# Patient Record
Sex: Male | Born: 1940 | Race: Black or African American | Hispanic: No | Marital: Married | Smoking: Former smoker
Health system: Southern US, Community
[De-identification: ages and names within clinical notes are randomized; demographics above are authoritative.]

## PROBLEM LIST (undated history)

## (undated) DIAGNOSIS — Q6231 Congenital ureterocele, orthotopic: Secondary | ICD-10-CM

## (undated) DIAGNOSIS — E119 Type 2 diabetes mellitus without complications: Secondary | ICD-10-CM

## (undated) DIAGNOSIS — I10 Essential (primary) hypertension: Secondary | ICD-10-CM

## (undated) DIAGNOSIS — C61 Malignant neoplasm of prostate: Secondary | ICD-10-CM

## (undated) HISTORY — PX: EYE SURGERY: SHX253

## (undated) HISTORY — DX: Malignant neoplasm of prostate: C61

## (undated) HISTORY — PX: COLONOSCOPY W/ POLYPECTOMY: SHX1380

---

## 1999-06-12 ENCOUNTER — Emergency Department (HOSPITAL_COMMUNITY): Admission: EM | Admit: 1999-06-12 | Discharge: 1999-06-12 | Payer: Self-pay | Admitting: Emergency Medicine

## 1999-06-13 ENCOUNTER — Encounter: Payer: Self-pay | Admitting: Emergency Medicine

## 1999-09-13 ENCOUNTER — Emergency Department (HOSPITAL_COMMUNITY): Admission: EM | Admit: 1999-09-13 | Discharge: 1999-09-13 | Payer: Self-pay | Admitting: *Deleted

## 2001-02-21 ENCOUNTER — Encounter: Admission: RE | Admit: 2001-02-21 | Discharge: 2001-05-22 | Payer: Self-pay | Admitting: Internal Medicine

## 2006-03-14 ENCOUNTER — Ambulatory Visit (HOSPITAL_COMMUNITY): Admission: RE | Admit: 2006-03-14 | Discharge: 2006-03-15 | Payer: Self-pay | Admitting: Ophthalmology

## 2006-06-04 ENCOUNTER — Emergency Department (HOSPITAL_COMMUNITY): Admission: EM | Admit: 2006-06-04 | Discharge: 2006-06-04 | Payer: Self-pay | Admitting: Emergency Medicine

## 2008-12-11 ENCOUNTER — Encounter: Payer: Self-pay | Admitting: Gastroenterology

## 2009-01-15 DIAGNOSIS — F528 Other sexual dysfunction not due to a substance or known physiological condition: Secondary | ICD-10-CM

## 2009-01-15 DIAGNOSIS — I10 Essential (primary) hypertension: Secondary | ICD-10-CM | POA: Insufficient documentation

## 2009-01-15 DIAGNOSIS — R011 Cardiac murmur, unspecified: Secondary | ICD-10-CM | POA: Insufficient documentation

## 2009-01-15 DIAGNOSIS — E785 Hyperlipidemia, unspecified: Secondary | ICD-10-CM

## 2009-01-20 ENCOUNTER — Ambulatory Visit: Payer: Self-pay | Admitting: Gastroenterology

## 2009-01-20 DIAGNOSIS — E119 Type 2 diabetes mellitus without complications: Secondary | ICD-10-CM | POA: Insufficient documentation

## 2009-02-12 ENCOUNTER — Encounter: Payer: Self-pay | Admitting: Gastroenterology

## 2009-02-18 ENCOUNTER — Ambulatory Visit: Payer: Self-pay | Admitting: Gastroenterology

## 2009-03-03 ENCOUNTER — Telehealth: Payer: Self-pay | Admitting: Gastroenterology

## 2009-03-04 ENCOUNTER — Ambulatory Visit: Payer: Self-pay | Admitting: Gastroenterology

## 2009-03-04 ENCOUNTER — Encounter: Payer: Self-pay | Admitting: Gastroenterology

## 2009-03-09 ENCOUNTER — Encounter: Payer: Self-pay | Admitting: Gastroenterology

## 2009-05-20 DIAGNOSIS — N529 Male erectile dysfunction, unspecified: Secondary | ICD-10-CM | POA: Insufficient documentation

## 2010-08-05 ENCOUNTER — Ambulatory Visit (HOSPITAL_COMMUNITY)
Admission: RE | Admit: 2010-08-05 | Discharge: 2010-08-05 | Payer: Self-pay | Source: Home / Self Care | Attending: Urology | Admitting: Urology

## 2010-08-06 ENCOUNTER — Ambulatory Visit (HOSPITAL_COMMUNITY)
Admission: RE | Admit: 2010-08-06 | Discharge: 2010-08-06 | Payer: Self-pay | Source: Home / Self Care | Attending: Urology | Admitting: Urology

## 2010-08-12 DIAGNOSIS — C61 Malignant neoplasm of prostate: Secondary | ICD-10-CM | POA: Insufficient documentation

## 2010-08-19 ENCOUNTER — Other Ambulatory Visit (HOSPITAL_COMMUNITY): Payer: Self-pay | Admitting: Urology

## 2010-08-19 DIAGNOSIS — Q6231 Congenital ureterocele, orthotopic: Secondary | ICD-10-CM

## 2010-08-23 ENCOUNTER — Ambulatory Visit: Payer: No Typology Code available for payment source | Attending: Radiation Oncology | Admitting: Radiation Oncology

## 2010-08-23 DIAGNOSIS — E119 Type 2 diabetes mellitus without complications: Secondary | ICD-10-CM | POA: Insufficient documentation

## 2010-08-23 DIAGNOSIS — E785 Hyperlipidemia, unspecified: Secondary | ICD-10-CM | POA: Insufficient documentation

## 2010-08-23 DIAGNOSIS — Z87891 Personal history of nicotine dependence: Secondary | ICD-10-CM | POA: Insufficient documentation

## 2010-08-23 DIAGNOSIS — I10 Essential (primary) hypertension: Secondary | ICD-10-CM | POA: Insufficient documentation

## 2010-08-23 DIAGNOSIS — Z510 Encounter for antineoplastic radiation therapy: Secondary | ICD-10-CM | POA: Insufficient documentation

## 2010-08-23 DIAGNOSIS — C61 Malignant neoplasm of prostate: Secondary | ICD-10-CM | POA: Insufficient documentation

## 2010-08-26 ENCOUNTER — Encounter (HOSPITAL_COMMUNITY)
Admission: RE | Admit: 2010-08-26 | Discharge: 2010-08-26 | Disposition: A | Discharge status: Home / Self Care | Payer: No Typology Code available for payment source | Source: Ambulatory Visit | Attending: Urology | Admitting: Urology

## 2010-08-26 ENCOUNTER — Encounter (HOSPITAL_COMMUNITY): Payer: Self-pay

## 2010-08-26 DIAGNOSIS — N2889 Other specified disorders of kidney and ureter: Secondary | ICD-10-CM | POA: Insufficient documentation

## 2010-08-26 DIAGNOSIS — Q6231 Congenital ureterocele, orthotopic: Secondary | ICD-10-CM

## 2010-08-26 HISTORY — DX: Congenital ureterocele, orthotopic: Q62.31

## 2010-08-26 MED ORDER — TECHNETIUM TC 99M MERTIATIDE
14.9000 | Freq: Once | INTRAVENOUS | Status: AC | PRN
  Administered 2010-08-26: 14.9 via INTRAVENOUS

## 2010-08-26 MED ORDER — FUROSEMIDE 10 MG/ML IJ SOLN: 44.0000 mg/kg | Freq: Once | INTRAMUSCULAR | Status: DC

## 2010-10-16 LAB — GLUCOSE, CAPILLARY
Glucose-Capillary: 152 mg/dL — ABNORMAL HIGH (ref 70–99)
Glucose-Capillary: 154 mg/dL — ABNORMAL HIGH (ref 70–99)

## 2010-11-21 ENCOUNTER — Ambulatory Visit
Admission: RE | Admit: 2010-11-21 | Discharge: 2010-11-21 | Disposition: A | Discharge status: Home / Self Care | Payer: No Typology Code available for payment source | Source: Ambulatory Visit | Attending: Radiation Oncology | Admitting: Radiation Oncology

## 2010-11-21 DIAGNOSIS — C61 Malignant neoplasm of prostate: Secondary | ICD-10-CM | POA: Insufficient documentation

## 2010-11-21 DIAGNOSIS — Z510 Encounter for antineoplastic radiation therapy: Secondary | ICD-10-CM | POA: Insufficient documentation

## 2010-11-26 NOTE — Op Note (Signed)
[name], [name]              ACCOUNT NO.:  [id number]   MEDICAL RECORD NO.:  [id number]          PATIENT TYPE:  AMB   LOCATION:  SDS                          FACILITY:  [facility]   PHYSICIAN:  John D. Ashley Royalty, M.D. DATE OF BIRTH:  03-10-1941   DATE OF PROCEDURE:  03/14/2006  DATE OF DISCHARGE:                                 OPERATIVE REPORT   ADMISSION DIAGNOSIS:  Rhegmatogenous retinal detachment, left eye.   PROCEDURES:  Scleral buckle left eye, retinal photocoagulation left eye.   SURGEON:  Alan Mulder, MD   ASSISTANT:  Adela Ports, R.N.   ANESTHESIA:  General.   DETAILS:  Usual prep and drape, 360 degree limbal peritomy, isolation of 4  rectus muscles on 2-0 silk.  Localization of break in the upper temporal  quadrant, scleral dissection from 11 o'clock around to 10 o'clock to admit a  number 279 intrascleral implant.  Diathermy placed in the bed, 279 implant  placed around the globe from 11 o'clock around to 10 o'clock, 240 band  placed around the eye with a 270 sleeve at 10:30 o'clock.  Perforation site  chosen at 5 o'clock in the posterior aspect of the bed.  A large amount of  clear yellow subretinal fluid came forth.  The fluid was thick and sticky.  When the fluid stopped, the scleral flaps were closed with 4-0 interrupted  Mersilene sutures.  The buckle was adjusted and trimmed.  The band was  adjusted and trimmed.  The sutures were knotted and trimmed.  The indirect  ophthalmoscopy showed the retina to be lying nicely on the scleral buckle  with no subretinal fluid remaining.  The indirect ophthalmoscope laser was  moved into place, and 1100 burns were placed around the retinal periphery  and around the retinal break with a power of 500 milliwatts 1.1 seconds each  and 1000 microns each.  The conjunctiva was reapproximated with 7-0 chromic  suture.  Polymyxin and gentamicin were irrigated into [place] space.  Atropine  solution was applied.  Marcaine was injected  around the globe for postop  pain.  TobraDex ophthalmic ointment, a patch and shield were placed.  Closing pressure was 15 with a Baer keratometer.  Complications none.  Duration 2 hours.  The patient is awakened, taken to recovery in  satisfactory condition.      [name], M.D.  Electronically Signed     JDM/MEDQ  D:  03/14/2006  T:  03/14/2006  Job:  [id number]

## 2011-02-10 ENCOUNTER — Ambulatory Visit: Payer: No Typology Code available for payment source | Admitting: Radiation Oncology

## 2011-03-10 ENCOUNTER — Emergency Department (HOSPITAL_COMMUNITY)
Admission: EM | Admit: 2011-03-10 | Discharge: 2011-03-10 | Disposition: A | Discharge status: Home / Self Care | Payer: No Typology Code available for payment source | Attending: Emergency Medicine | Admitting: Emergency Medicine

## 2011-03-10 ENCOUNTER — Ambulatory Visit
Admission: RE | Admit: 2011-03-10 | Discharge: 2011-03-10 | Disposition: A | Discharge status: Home / Self Care | Payer: No Typology Code available for payment source | Source: Ambulatory Visit | Attending: Radiation Oncology | Admitting: Radiation Oncology

## 2011-03-10 DIAGNOSIS — Z794 Long term (current) use of insulin: Secondary | ICD-10-CM | POA: Insufficient documentation

## 2011-03-10 DIAGNOSIS — E1169 Type 2 diabetes mellitus with other specified complication: Secondary | ICD-10-CM | POA: Insufficient documentation

## 2011-03-10 DIAGNOSIS — R55 Syncope and collapse: Secondary | ICD-10-CM | POA: Insufficient documentation

## 2011-03-10 LAB — DIFFERENTIAL
Lymphs Abs: 0.7 10*3/uL (ref 0.7–4.0)
Monocytes Absolute: 0.3 10*3/uL (ref 0.1–1.0)
Monocytes Relative: 3 % (ref 3–12)
Neutro Abs: 8.6 10*3/uL — ABNORMAL HIGH (ref 1.7–7.7)
Neutrophils Relative %: 89 % — ABNORMAL HIGH (ref 43–77)

## 2011-03-10 LAB — CBC
HCT: 37 % — ABNORMAL LOW (ref 39.0–52.0)
Hemoglobin: 12.8 g/dL — ABNORMAL LOW (ref 13.0–17.0)
MCH: 28.8 pg (ref 26.0–34.0)
MCHC: 34.6 g/dL (ref 30.0–36.0)
MCV: 83.3 fL (ref 78.0–100.0)
RBC: 4.44 MIL/uL (ref 4.22–5.81)

## 2011-03-10 LAB — BASIC METABOLIC PANEL
BUN: 22 mg/dL (ref 6–23)
CO2: 29 mEq/L (ref 19–32)
Calcium: 10.8 mg/dL — ABNORMAL HIGH (ref 8.4–10.5)
Creatinine, Ser: 1.34 mg/dL (ref 0.50–1.35)
Glucose, Bld: 332 mg/dL — ABNORMAL HIGH (ref 70–99)

## 2011-03-10 LAB — URINALYSIS, ROUTINE W REFLEX MICROSCOPIC
Bilirubin Urine: NEGATIVE
Glucose, UA: 250 mg/dL — AB
Hgb urine dipstick: NEGATIVE
Protein, ur: NEGATIVE mg/dL

## 2011-03-10 LAB — GLUCOSE, CAPILLARY: Glucose-Capillary: 345 mg/dL — ABNORMAL HIGH (ref 70–99)

## 2012-02-22 ENCOUNTER — Encounter: Payer: Self-pay | Admitting: Gastroenterology

## 2012-10-18 ENCOUNTER — Encounter: Payer: Self-pay | Admitting: Gastroenterology

## 2014-02-06 ENCOUNTER — Emergency Department (HOSPITAL_COMMUNITY): Payer: Medicare Other

## 2014-02-06 ENCOUNTER — Emergency Department (HOSPITAL_COMMUNITY)
Admission: EM | Admit: 2014-02-06 | Discharge: 2014-02-06 | Disposition: A | Discharge status: Home / Self Care | Payer: Medicare Other | Attending: Emergency Medicine | Admitting: Emergency Medicine

## 2014-02-06 ENCOUNTER — Encounter (HOSPITAL_COMMUNITY): Payer: Self-pay | Admitting: Emergency Medicine

## 2014-02-06 DIAGNOSIS — K859 Acute pancreatitis without necrosis or infection, unspecified: Secondary | ICD-10-CM | POA: Insufficient documentation

## 2014-02-06 DIAGNOSIS — E119 Type 2 diabetes mellitus without complications: Secondary | ICD-10-CM | POA: Insufficient documentation

## 2014-02-06 DIAGNOSIS — Z794 Long term (current) use of insulin: Secondary | ICD-10-CM | POA: Insufficient documentation

## 2014-02-06 DIAGNOSIS — Z79899 Other long term (current) drug therapy: Secondary | ICD-10-CM | POA: Insufficient documentation

## 2014-02-06 DIAGNOSIS — R1011 Right upper quadrant pain: Secondary | ICD-10-CM | POA: Diagnosis present

## 2014-02-06 DIAGNOSIS — Q6231 Congenital ureterocele, orthotopic: Secondary | ICD-10-CM | POA: Insufficient documentation

## 2014-02-06 DIAGNOSIS — I10 Essential (primary) hypertension: Secondary | ICD-10-CM | POA: Diagnosis not present

## 2014-02-06 DIAGNOSIS — Z87891 Personal history of nicotine dependence: Secondary | ICD-10-CM | POA: Insufficient documentation

## 2014-02-06 HISTORY — DX: Essential (primary) hypertension: I10

## 2014-02-06 HISTORY — DX: Type 2 diabetes mellitus without complications: E11.9

## 2014-02-06 LAB — COMPREHENSIVE METABOLIC PANEL
ALBUMIN: 4.2 g/dL (ref 3.5–5.2)
ALK PHOS: 100 U/L (ref 39–117)
ALT: 13 U/L (ref 0–53)
AST: 14 U/L (ref 0–37)
Anion gap: 13 (ref 5–15)
BUN: 15 mg/dL (ref 6–23)
CALCIUM: 10.1 mg/dL (ref 8.4–10.5)
CO2: 28 mEq/L (ref 19–32)
Chloride: 97 mEq/L (ref 96–112)
Creatinine, Ser: 1.27 mg/dL (ref 0.50–1.35)
GFR calc non Af Amer: 54 mL/min — ABNORMAL LOW (ref >90)
GFR, EST AFRICAN AMERICAN: 63 mL/min — AB (ref >90)
GLUCOSE: 245 mg/dL — AB (ref 70–99)
POTASSIUM: 4.2 meq/L (ref 3.7–5.3)
SODIUM: 138 meq/L (ref 137–147)
TOTAL PROTEIN: 8.1 g/dL (ref 6.0–8.3)
Total Bilirubin: 0.9 mg/dL (ref 0.3–1.2)

## 2014-02-06 LAB — CBC WITH DIFFERENTIAL/PLATELET
BASOS ABS: 0 10*3/uL (ref 0.0–0.1)
Basophils Relative: 0 % (ref 0–1)
EOS ABS: 0 10*3/uL (ref 0.0–0.7)
EOS PCT: 1 % (ref 0–5)
HCT: 43 % (ref 39.0–52.0)
Hemoglobin: 14.3 g/dL (ref 13.0–17.0)
LYMPHS ABS: 1.5 10*3/uL (ref 0.7–4.0)
Lymphocytes Relative: 19 % (ref 12–46)
MCH: 27.7 pg (ref 26.0–34.0)
MCHC: 33.3 g/dL (ref 30.0–36.0)
MCV: 83.3 fL (ref 78.0–100.0)
Monocytes Absolute: 0.4 10*3/uL (ref 0.1–1.0)
Monocytes Relative: 5 % (ref 3–12)
NEUTROS PCT: 75 % (ref 43–77)
Neutro Abs: 6.1 10*3/uL (ref 1.7–7.7)
PLATELETS: 178 10*3/uL (ref 150–400)
RBC: 5.16 MIL/uL (ref 4.22–5.81)
RDW: 12.6 % (ref 11.5–15.5)
WBC: 8 10*3/uL (ref 4.0–10.5)

## 2014-02-06 LAB — I-STAT CG4 LACTIC ACID, ED: Lactic Acid, Venous: 1.64 mmol/L (ref 0.5–2.2)

## 2014-02-06 LAB — LIPASE, BLOOD: Lipase: 45 U/L (ref 11–59)

## 2014-02-06 LAB — URINALYSIS, ROUTINE W REFLEX MICROSCOPIC
BILIRUBIN URINE: NEGATIVE
Glucose, UA: 250 mg/dL — AB
Hgb urine dipstick: NEGATIVE
Ketones, ur: NEGATIVE mg/dL
NITRITE: NEGATIVE
PH: 7 (ref 5.0–8.0)
Protein, ur: NEGATIVE mg/dL
SPECIFIC GRAVITY, URINE: 1.018 (ref 1.005–1.030)
UROBILINOGEN UA: 1 mg/dL (ref 0.0–1.0)

## 2014-02-06 LAB — I-STAT TROPONIN, ED: TROPONIN I, POC: 0 ng/mL (ref 0.00–0.08)

## 2014-02-06 LAB — URINE MICROSCOPIC-ADD ON

## 2014-02-06 LAB — CBG MONITORING, ED: GLUCOSE-CAPILLARY: 240 mg/dL — AB (ref 70–99)

## 2014-02-06 MED ORDER — HYDROCODONE-ACETAMINOPHEN 5-325 MG PO TABS
2.0000 | ORAL_TABLET | ORAL | Status: DC | PRN
Start: 2014-02-06 — End: 2015-11-21

## 2014-02-06 MED ORDER — ONDANSETRON 4 MG PO TBDP
4.0000 mg | ORAL_TABLET | Freq: Once | ORAL | Status: AC
  Administered 2014-02-06: 4 mg via ORAL
  Filled 2014-02-06: qty 1

## 2014-02-06 MED ORDER — ONDANSETRON 4 MG PO TBDP: 4.0000 mg | ORAL_TABLET | Freq: Three times a day (TID) | ORAL | Status: DC | PRN

## 2014-02-06 MED ORDER — ONDANSETRON HCL 4 MG/2ML IJ SOLN
4.0000 mg | Freq: Once | INTRAMUSCULAR | Status: AC
  Administered 2014-02-06: 4 mg via INTRAVENOUS
  Filled 2014-02-06: qty 2

## 2014-02-06 MED ORDER — IOHEXOL 300 MG/ML  SOLN
100.0000 mL | Freq: Once | INTRAMUSCULAR | Status: AC | PRN
  Administered 2014-02-06: 100 mL via INTRAVENOUS

## 2014-02-06 MED ORDER — IOHEXOL 300 MG/ML  SOLN
25.0000 mL | Freq: Once | INTRAMUSCULAR | Status: AC | PRN
  Administered 2014-02-06: 25 mL via ORAL

## 2014-02-06 MED ORDER — BENAZEPRIL HCL 40 MG PO TABS
40.0000 mg | ORAL_TABLET | Freq: Once | ORAL | Status: AC
  Administered 2014-02-06: 40 mg via ORAL
  Filled 2014-02-06: qty 1

## 2014-02-06 MED ORDER — SODIUM CHLORIDE 0.9 % IV BOLUS (SEPSIS)
1000.0000 mL | Freq: Once | INTRAVENOUS | Status: AC
  Administered 2014-02-06: 1000 mL via INTRAVENOUS

## 2014-02-06 NOTE — ED Notes (Signed)
Pt provided w/ diet ginger ale.

## 2014-02-06 NOTE — ED Notes (Signed)
Patient transported to CT 

## 2014-02-06 NOTE — ED Notes (Signed)
Pt w/ onset of nausea and diarrhea on Tuesday, denies fever or vomiting - admitted to some abd pain yesterday however denies any at present. Pt associates symptoms to "possible food poisoning."

## 2014-02-06 NOTE — ED Notes (Signed)
Checked patient blood sugar it was 240 notified RN sabrina of blood sugar

## 2014-02-06 NOTE — ED Notes (Signed)
Family member reports patient has had stomach cramps and diarrhea since Tuesday.  Pt reports upper abdominal pain.  No sob or chest pain

## 2014-02-06 NOTE — Discharge Instructions (Signed)

## 2014-02-06 NOTE — ED Notes (Signed)
Pt ambulating independently w/ steady gait on d/c in no acute distress, A&Ox4. D/c instructions reviewed w/ pt and family - pt and family deny any further questions or concerns at present. Rx given x2  

## 2014-02-06 NOTE — ED Provider Notes (Signed)
CSN: [id number]     Arrival date & time 02/06/14  1609 History   First MD Initiated Contact with Patient 02/06/14 1933     Chief Complaint  Patient presents with  . Abdominal Pain  . Diarrhea     (Consider location/radiation/quality/duration/timing/severity/associated sxs/prior Treatment) Patient is a 73 y.o. male presenting with cramps. The history is provided by the patient.  Abdominal Cramping Pain location:  RUQ Pain quality: cramping   Pain radiates to:  Does not radiate Pain severity:  Moderate Onset quality:  Sudden Duration:  2 days Timing:  Intermittent Progression:  Waxing and waning Chronicity:  New Context: suspicious food intake   Relieved by:  Nothing Worsened by:  Nothing tried Ineffective treatments:  None tried Associated symptoms: diarrhea and vomiting   Associated symptoms: no chest pain, no chills, no fever and no shortness of breath   Risk factors: being elderly     Patient is a 73 year old male with a chief complaint of right upper quadrant abdominal pain nausea and diarrhea. Patient states is going on since Sunday. Personally 4 days ago. Patient states that the night before that he went to a retired [employer]. Otherwise the patient denies any strange food intake. He denies any past abdominal surgeries. Patient denies fevers chills. Patient did not examine his stool for blood or dark stools.   Past Medical History  Diagnosis Date  . Congenital ureterocele   . Diabetes mellitus without complication   . Hypertension    Past Surgical History  Procedure Laterality Date  . Eye surgery     No family history on file. History  Substance Use Topics  . Smoking status: Former Research scientist (life sciences)  . Smokeless tobacco: Not on file  . Alcohol Use: No    Review of Systems  Constitutional: Negative for fever and chills.  HENT: Negative for congestion and facial swelling.   Eyes: Negative for discharge and visual disturbance.  Respiratory: Negative for  shortness of breath.   Cardiovascular: Negative for chest pain and palpitations.  Gastrointestinal: Positive for vomiting and diarrhea. Negative for abdominal pain.  Musculoskeletal: Negative for arthralgias and myalgias.  Skin: Negative for color change and rash.  Neurological: Negative for tremors, syncope and headaches.  Psychiatric/Behavioral: Negative for confusion and dysphoric mood.      Allergies  Review of patient's allergies indicates no known allergies.  Home Medications   Prior to Admission medications   Medication Sig Start Date End Date Taking? Authorizing Provider  benazepril (LOTENSIN) 40 MG tablet Take 40 mg by mouth daily.   Yes Historical Provider, MD  carvedilol (COREG) 6.25 MG tablet Take 6.25 mg by mouth 2 (two) times daily with a meal.   Yes Historical Provider, MD  Insulin Glargine (LANTUS SOLOSTAR) 100 UNIT/ML Solostar Pen Inject 50 Units into the skin daily.   Yes Historical Provider, MD  insulin lispro (HUMALOG KWIKPEN) 100 UNIT/ML KiwkPen Inject 12 Units into the skin 3 (three) times daily.   Yes Historical Provider, MD  metFORMIN (GLUMETZA) 1000 MG (MOD) 24 hr tablet Take 1,000 mg by mouth daily with breakfast.   Yes Historical Provider, MD  rosuvastatin (CRESTOR) 20 MG tablet Take 20 mg by mouth at bedtime.   Yes Historical Provider, MD  triamterene-hydrochlorothiazide (MAXZIDE-25) 37.5-25 MG per tablet Take 1 tablet by mouth daily.   Yes Historical Provider, MD  HYDROcodone-acetaminophen (NORCO/VICODIN) 5-325 MG per tablet Take 2 tablets by mouth every 4 (four) hours as needed for moderate pain or severe pain. 02/06/14   Linna Hoff  Tyrone Nine, MD  ondansetron (ZOFRAN ODT) 4 MG disintegrating tablet Take 1 tablet (4 mg total) by mouth every 8 (eight) hours as needed for nausea or vomiting. 02/06/14   Deno Etienne, MD   BP 180/99  Pulse 78  Temp(Src) 99.1 F (37.3 C) (Oral)  Resp 20  Wt 200 lb (90.719 kg)  SpO2 98% Physical Exam  Constitutional: He is oriented to  person, place, and time. He appears well-developed and well-nourished.  HENT:  Head: Normocephalic and atraumatic.  Eyes: EOM are normal. Pupils are equal, round, and reactive to light.  Neck: Normal range of motion. Neck supple. No JVD present.  Cardiovascular: Normal rate and regular rhythm.  Exam reveals no gallop and no friction rub.   No murmur heard. Pulmonary/Chest: No respiratory distress. He has no wheezes.  Abdominal: He exhibits no distension. There is no tenderness. There is no rebound and no guarding.  Musculoskeletal: Normal range of motion.  Neurological: He is alert and oriented to person, place, and time.  Skin: No rash noted. No pallor.  Psychiatric: He has a normal mood and affect. His behavior is normal.    ED Course  Procedures (including critical care time) Labs Review Labs Reviewed  COMPREHENSIVE METABOLIC PANEL - Abnormal; Notable for the following:    Glucose, Bld 245 (*)    GFR calc non Af Amer 54 (*)    GFR calc Af Amer 63 (*)    All other components within normal limits  URINALYSIS, ROUTINE W REFLEX MICROSCOPIC - Abnormal; Notable for the following:    Glucose, UA 250 (*)    Leukocytes, UA SMALL (*)    All other components within normal limits  URINE MICROSCOPIC-ADD ON - Abnormal; Notable for the following:    Squamous Epithelial / LPF FEW (*)    Bacteria, UA FEW (*)    All other components within normal limits  CBG MONITORING, ED - Abnormal; Notable for the following:    Glucose-Capillary 240 (*)    All other components within normal limits  CBC WITH DIFFERENTIAL  LIPASE, BLOOD  I-STAT TROPOININ, ED  I-STAT CG4 LACTIC ACID, ED    Imaging Review Ct Abdomen Pelvis W Contrast  02/06/2014   CLINICAL DATA:  Abdominal pain and distention.  EXAM: CT ABDOMEN AND PELVIS WITH CONTRAST  TECHNIQUE: Multidetector CT imaging of the abdomen and pelvis was performed using the standard protocol following bolus administration of intravenous contrast.  CONTRAST:   135mL OMNIPAQUE IOHEXOL 300 MG/ML  SOLN  COMPARISON:  CT scan of the pelvis dated 08/13/2010  FINDINGS: There is haziness around the head of the pancreas consistent with slight pancreatitis. Pancreatic duct measures 2.5 mm in diameter. Liver, biliary tree, spleen, and adrenal glands are normal. There are multiple benign-appearing left renal cysts. There is a large right peripelvic cyst which compresses the right renal pelvis. Both ureters are slightly dilated to the level of the bladder where there are prominent bilateral ureteroceles, 4.7 cm on the left and 1.7 cm on the right.  There are multiple diverticula in the distal colon but there is no diverticulitis. The bowel is otherwise normal including the terminal ileum and appendix. No acute osseous abnormalities.  IMPRESSION: 1. Findings consistent with mild pancreatitis. 2. Large bilateral ureteroceles.   Electronically Signed   By: Rozetta Nunnery M.D.   On: 02/06/2014 23:11     EKG Interpretation   Date/Time:  Thursday February 06 2014 16:37:42 EDT Ventricular Rate:  67 PR Interval:  164 QRS Duration: 104  QT Interval:  380 QTC Calculation: 401 R Axis:   -26 Text Interpretation:  Normal sinus rhythm Normal ECG No significant change  since last tracing Confirmed by YAO  MD, DAVID ([id number]) on 02/06/2014  9:07:57 PM      MDM   Final diagnoses:  Acute pancreatitis, unspecified pancreatitis type    73 year old male with benign laboratory workup for right upper quadrant abdominal pain. Patient's pain currently is 0 patient with some residual nausea will attempt to give Zofran oral trial.  Due to age and continued nausea, will CT abdomen/pelvis w/ contrast.   Patient returned from CT with worsening nausea, give IV zofran.   Mild pancreatitis noted on CT scan. Benefits and risks of admission discussed with the patient. Patient feels that he is safe for discharge home. Patient will be given pain and nausea medications. Followup with PCP in 2  days.  11:49 PM:  I have discussed the diagnosis/risks/treatment options with the patient and family and believe the pt to be eligible for discharge home to follow-up with PCP. We also discussed returning to the ED immediately if new or worsening sx occur. We discussed the sx which are most concerning (e.g., sudden worsening abdominal pain) that necessitate immediate return. Medications administered to the patient during their visit and any new prescriptions provided to the patient are listed below.  Medications given during this visit Medications  ondansetron (ZOFRAN-ODT) disintegrating tablet 4 mg (4 mg Oral Given 02/06/14 2030)  benazepril (LOTENSIN) tablet 40 mg (40 mg Oral Given 02/06/14 2152)  sodium chloride 0.9 % bolus 1,000 mL (0 mLs Intravenous Stopped 02/06/14 2309)  iohexol (OMNIPAQUE) 300 MG/ML solution 25 mL (25 mLs Oral Contrast Given 02/06/14 2211)  iohexol (OMNIPAQUE) 300 MG/ML solution 100 mL (100 mLs Intravenous Contrast Given 02/06/14 2226)  ondansetron (ZOFRAN) injection 4 mg (4 mg Intravenous Given 02/06/14 2254)    New Prescriptions   HYDROCODONE-ACETAMINOPHEN (NORCO/VICODIN) 5-325 MG PER TABLET    Take 2 tablets by mouth every 4 (four) hours as needed for moderate pain or severe pain.   ONDANSETRON (ZOFRAN ODT) 4 MG DISINTEGRATING TABLET    Take 1 tablet (4 mg total) by mouth every 8 (eight) hours as needed for nausea or vomiting.     Deno Etienne, MD 02/06/14 [phone]

## 2014-02-07 NOTE — ED Provider Notes (Signed)
I saw and evaluated the patient, reviewed the resident's note and I agree with the findings and plan.   EKG Interpretation   Date/Time:  Thursday February 06 2014 16:37:42 EDT Ventricular Rate:  67 PR Interval:  164 QRS Duration: 104 QT Interval:  380 QTC Calculation: 401 R Axis:   -26 Text Interpretation:  Normal sinus rhythm Normal ECG No significant change  since last tracing Confirmed by YAO  MD, DAVID ([id number]) on 02/06/2014  9:07:57 PM      Xavier [name] is a 73 y.o. male hx of DM, HTN here with abdominal pain, nausea, diarrhea started several days ago after eating fireman's breakfast. Intermittent nausea since then. Vitals stable. Abdomen slightly distended, mild tenderness RUQ and RLQ. CT showed mild pancreatitis but lipase nl. Able to tolerate PO. Will d/c home.     Wandra Arthurs, MD 02/07/14 [phone]

## 2014-02-11 ENCOUNTER — Other Ambulatory Visit: Payer: Self-pay | Admitting: Internal Medicine

## 2014-02-11 DIAGNOSIS — K859 Acute pancreatitis, unspecified: Secondary | ICD-10-CM

## 2014-02-14 ENCOUNTER — Ambulatory Visit
Admission: RE | Admit: 2014-02-14 | Discharge: 2014-02-14 | Disposition: A | Discharge status: Home / Self Care | Payer: Medicare Other | Source: Ambulatory Visit | Attending: Internal Medicine | Admitting: Internal Medicine

## 2014-02-14 ENCOUNTER — Other Ambulatory Visit: Payer: Self-pay | Admitting: Internal Medicine

## 2014-02-14 DIAGNOSIS — K859 Acute pancreatitis, unspecified: Secondary | ICD-10-CM

## 2014-04-01 DIAGNOSIS — M25519 Pain in unspecified shoulder: Secondary | ICD-10-CM | POA: Insufficient documentation

## 2014-04-02 ENCOUNTER — Encounter: Payer: Self-pay | Admitting: Gastroenterology

## 2014-05-27 ENCOUNTER — Other Ambulatory Visit: Payer: Self-pay | Admitting: Orthopedic Surgery

## 2014-05-27 DIAGNOSIS — M25511 Pain in right shoulder: Secondary | ICD-10-CM

## 2014-05-30 ENCOUNTER — Ambulatory Visit
Admission: RE | Admit: 2014-05-30 | Discharge: 2014-05-30 | Disposition: A | Discharge status: Home / Self Care | Payer: Medicare Other | Source: Ambulatory Visit | Attending: Orthopedic Surgery | Admitting: Orthopedic Surgery

## 2014-05-30 DIAGNOSIS — M25511 Pain in right shoulder: Secondary | ICD-10-CM

## 2014-06-02 ENCOUNTER — Other Ambulatory Visit: Payer: Medicare Other

## 2014-06-10 DIAGNOSIS — M79609 Pain in unspecified limb: Secondary | ICD-10-CM | POA: Insufficient documentation

## 2014-07-30 DIAGNOSIS — Z1389 Encounter for screening for other disorder: Secondary | ICD-10-CM | POA: Insufficient documentation

## 2014-09-28 ENCOUNTER — Emergency Department (HOSPITAL_COMMUNITY)
Admission: EM | Admit: 2014-09-28 | Discharge: 2014-09-28 | Disposition: A | Discharge status: Home / Self Care | Payer: Medicare Other | Attending: Emergency Medicine | Admitting: Emergency Medicine

## 2014-09-28 ENCOUNTER — Encounter (HOSPITAL_COMMUNITY): Payer: Self-pay | Admitting: *Deleted

## 2014-09-28 DIAGNOSIS — E11649 Type 2 diabetes mellitus with hypoglycemia without coma: Secondary | ICD-10-CM | POA: Insufficient documentation

## 2014-09-28 DIAGNOSIS — N289 Disorder of kidney and ureter, unspecified: Secondary | ICD-10-CM | POA: Insufficient documentation

## 2014-09-28 DIAGNOSIS — E162 Hypoglycemia, unspecified: Secondary | ICD-10-CM

## 2014-09-28 DIAGNOSIS — Z794 Long term (current) use of insulin: Secondary | ICD-10-CM | POA: Diagnosis not present

## 2014-09-28 DIAGNOSIS — I10 Essential (primary) hypertension: Secondary | ICD-10-CM | POA: Diagnosis not present

## 2014-09-28 DIAGNOSIS — Z79899 Other long term (current) drug therapy: Secondary | ICD-10-CM | POA: Insufficient documentation

## 2014-09-28 DIAGNOSIS — Z87891 Personal history of nicotine dependence: Secondary | ICD-10-CM | POA: Diagnosis not present

## 2014-09-28 LAB — CBC WITH DIFFERENTIAL/PLATELET
BASOS PCT: 0 % (ref 0–1)
Basophils Absolute: 0 10*3/uL (ref 0.0–0.1)
EOS ABS: 0 10*3/uL (ref 0.0–0.7)
Eosinophils Relative: 0 % (ref 0–5)
HEMATOCRIT: 43 % (ref 39.0–52.0)
Hemoglobin: 14.1 g/dL (ref 13.0–17.0)
LYMPHS PCT: 15 % (ref 12–46)
Lymphs Abs: 1.3 10*3/uL (ref 0.7–4.0)
MCH: 26.7 pg (ref 26.0–34.0)
MCHC: 32.8 g/dL (ref 30.0–36.0)
MCV: 81.3 fL (ref 78.0–100.0)
MONOS PCT: 4 % (ref 3–12)
Monocytes Absolute: 0.4 10*3/uL (ref 0.1–1.0)
Neutro Abs: 7.2 10*3/uL (ref 1.7–7.7)
Neutrophils Relative %: 81 % — ABNORMAL HIGH (ref 43–77)
Platelets: 268 10*3/uL (ref 150–400)
RBC: 5.29 MIL/uL (ref 4.22–5.81)
RDW: 12.7 % (ref 11.5–15.5)
WBC: 8.9 10*3/uL (ref 4.0–10.5)

## 2014-09-28 LAB — COMPREHENSIVE METABOLIC PANEL
ALBUMIN: 4 g/dL (ref 3.5–5.2)
ALK PHOS: 103 U/L (ref 39–117)
ALT: 21 U/L (ref 0–53)
AST: 25 U/L (ref 0–37)
Anion gap: 8 (ref 5–15)
BUN: 22 mg/dL (ref 6–23)
CO2: 29 mmol/L (ref 19–32)
Calcium: 9.9 mg/dL (ref 8.4–10.5)
Chloride: 101 mmol/L (ref 96–112)
Creatinine, Ser: 1.79 mg/dL — ABNORMAL HIGH (ref 0.50–1.35)
GFR calc Af Amer: 41 mL/min — ABNORMAL LOW (ref >90)
GFR, EST NON AFRICAN AMERICAN: 36 mL/min — AB (ref >90)
Glucose, Bld: 156 mg/dL — ABNORMAL HIGH (ref 70–99)
Potassium: 3.9 mmol/L (ref 3.5–5.1)
Sodium: 138 mmol/L (ref 135–145)
TOTAL PROTEIN: 7.5 g/dL (ref 6.0–8.3)
Total Bilirubin: 0.8 mg/dL (ref 0.3–1.2)

## 2014-09-28 LAB — URINALYSIS, ROUTINE W REFLEX MICROSCOPIC
BILIRUBIN URINE: NEGATIVE
GLUCOSE, UA: NEGATIVE mg/dL
HGB URINE DIPSTICK: NEGATIVE
Ketones, ur: NEGATIVE mg/dL
Nitrite: NEGATIVE
Protein, ur: 30 mg/dL — AB
SPECIFIC GRAVITY, URINE: 1.013 (ref 1.005–1.030)
Urobilinogen, UA: 1 mg/dL (ref 0.0–1.0)
pH: 6.5 (ref 5.0–8.0)

## 2014-09-28 LAB — URINE MICROSCOPIC-ADD ON

## 2014-09-28 LAB — I-STAT TROPONIN, ED: Troponin i, poc: 0.01 ng/mL (ref 0.00–0.08)

## 2014-09-28 LAB — CBG MONITORING, ED: Glucose-Capillary: 230 mg/dL — ABNORMAL HIGH (ref 70–99)

## 2014-09-28 MED ORDER — SODIUM CHLORIDE 0.9 % IV BOLUS (SEPSIS)
1000.0000 mL | Freq: Once | INTRAVENOUS | Status: AC
  Administered 2014-09-28: 1000 mL via INTRAVENOUS

## 2014-09-28 NOTE — ED Notes (Signed)
CBG 230 

## 2014-09-28 NOTE — ED Provider Notes (Signed)
CSN: [id number]     Arrival date & time 09/28/14  1613 History   First MD Initiated Contact with Patient 09/28/14 1615     Chief Complaint  Patient presents with  . Hypoglycemia  . Weakness     (Consider location/radiation/quality/duration/timing/severity/associated sxs/prior Treatment) The history is provided by the patient.  Xavier [name] is a 74 y.o. male hx of DM, HTN here with diaphoresis, low blood sugar. He missed lunch and went to a funeral to take pictures. He then felt like his blood sugar was low and became clammy and diaphoretic. Took 2 glucose tablets and cbg was 198 as per EMS. Denies chest pain or shortness of breath. Denies abdominal pain or vomiting. Took lantus and humalog this morning.    Past Medical History  Diagnosis Date  . Congenital ureterocele   . Diabetes mellitus without complication   . Hypertension    Past Surgical History  Procedure Laterality Date  . Eye surgery     History reviewed. No pertinent family history. History  Substance Use Topics  . Smoking status: Former Research scientist (life sciences)  . Smokeless tobacco: Not on file  . Alcohol Use: No    Review of Systems  Neurological: Positive for weakness.  All other systems reviewed and are negative.     Allergies  Review of patient's allergies indicates no known allergies.  Home Medications   Prior to Admission medications   Medication Sig Start Date End Date Taking? Authorizing Provider  benazepril (LOTENSIN) 40 MG tablet Take 40 mg by mouth daily.   Yes Historical Provider, MD  carvedilol (COREG) 6.25 MG tablet Take 6.25 mg by mouth 2 (two) times daily with a meal.   Yes Historical Provider, MD  Insulin Glargine (LANTUS SOLOSTAR) 100 UNIT/ML Solostar Pen Inject 50 Units into the skin daily.   Yes Historical Provider, MD  insulin lispro (HUMALOG KWIKPEN) 100 UNIT/ML KiwkPen Inject 12 Units into the skin 3 (three) times daily.   Yes Historical Provider, MD  metFORMIN (GLUMETZA) 1000 MG (MOD) 24 hr  tablet Take 1,000 mg by mouth daily with breakfast.   Yes Historical Provider, MD  rosuvastatin (CRESTOR) 20 MG tablet Take 20 mg by mouth at bedtime.   Yes Historical Provider, MD  triamterene-hydrochlorothiazide (MAXZIDE-25) 37.5-25 MG per tablet Take 1 tablet by mouth daily.   Yes Historical Provider, MD  HYDROcodone-acetaminophen (NORCO/VICODIN) 5-325 MG per tablet Take 2 tablets by mouth every 4 (four) hours as needed for moderate pain or severe pain. Patient not taking: Reported on 09/28/2014 02/06/14   Deno Etienne, DO  ondansetron (ZOFRAN ODT) 4 MG disintegrating tablet Take 1 tablet (4 mg total) by mouth every 8 (eight) hours as needed for nausea or vomiting. Patient not taking: Reported on 09/28/2014 02/06/14   Deno Etienne, DO   BP 157/68 mmHg  Pulse 77  Temp(Src) 97.9 F (36.6 C) (Oral)  Resp 21  SpO2 95% Physical Exam  Constitutional: He is oriented to person, place, and time.  Chronically ill, NAD   HENT:  Head: Normocephalic.  MM slightly dry   Eyes: Conjunctivae are normal. Pupils are equal, round, and reactive to light.  Neck: Normal range of motion. Neck supple.  Cardiovascular: Normal rate, regular rhythm and normal heart sounds.   Pulmonary/Chest: Effort normal and breath sounds normal. No respiratory distress. He has no wheezes. He has no rales.  Abdominal: Soft. Bowel sounds are normal. He exhibits no distension. There is no tenderness. There is no rebound and no guarding.  Musculoskeletal: Normal  range of motion. He exhibits no edema or tenderness.  Neurological: He is alert and oriented to person, place, and time. No cranial nerve deficit. Coordination normal.  Skin: Skin is warm and dry.  Psychiatric: He has a normal mood and affect. His behavior is normal. Thought content normal.  Nursing note and vitals reviewed.   ED Course  Procedures (including critical care time) Labs Review Labs Reviewed  CBC WITH DIFFERENTIAL/PLATELET - Abnormal; Notable for the following:     Neutrophils Relative % 81 (*)    All other components within normal limits  COMPREHENSIVE METABOLIC PANEL - Abnormal; Notable for the following:    Glucose, Bld 156 (*)    Creatinine, Ser 1.79 (*)    GFR calc non Af Amer 36 (*)    GFR calc Af Amer 41 (*)    All other components within normal limits  URINALYSIS, ROUTINE W REFLEX MICROSCOPIC - Abnormal; Notable for the following:    Protein, ur 30 (*)    Leukocytes, UA TRACE (*)    All other components within normal limits  CBG MONITORING, ED - Abnormal; Notable for the following:    Glucose-Capillary 230 (*)    All other components within normal limits  URINE MICROSCOPIC-ADD ON  I-STAT TROPOININ, ED    Imaging Review No results found.   EKG Interpretation None      MDM   Final diagnoses:  None    Xavier [name] is a 74 y.o. male here with diaphoresis likely from hypoglycemia. Will check labs and UA. Will hydrate and give food and recheck CBG. Doesn't appear septic.   7:40 PM Patient observed for the ED for 3 hrs. CBG was 230. Ate food. Patient's Cr more elevated likely from dehydration. Given NS 1L. Recommend frequent meals, request BMP   Wandra Arthurs, MD 09/28/14 1940

## 2014-09-28 NOTE — Discharge Instructions (Signed)
Eat frequent meals.   You are dehydrated from not drinking enough. Make sure you drink plenty of fluids.   Follow up with your doctor. Repeat kidney function test in a week.   Return to ER if you have blood sugar < 60 or greater than 500, vomiting, dehydration.

## 2014-09-28 NOTE — ED Notes (Signed)
Pt arrived by [facility]. Pt was at a funeral, felt like his blood sugar dropped and skin became clammy, pt took 2 glucose tablets. On ems arrival, cbg 198 and skin remained clammy. Iv started and bp 130/62.

## 2014-10-07 ENCOUNTER — Other Ambulatory Visit: Payer: Self-pay | Admitting: Endocrinology

## 2014-10-07 ENCOUNTER — Ambulatory Visit
Admission: RE | Admit: 2014-10-07 | Discharge: 2014-10-07 | Disposition: A | Discharge status: Home / Self Care | Payer: Medicare Other | Source: Ambulatory Visit | Attending: Endocrinology | Admitting: Endocrinology

## 2014-10-07 DIAGNOSIS — K858 Other acute pancreatitis without necrosis or infection: Secondary | ICD-10-CM

## 2014-10-08 DIAGNOSIS — N159 Renal tubulo-interstitial disease, unspecified: Secondary | ICD-10-CM | POA: Insufficient documentation

## 2014-10-13 DIAGNOSIS — I700 Atherosclerosis of aorta: Secondary | ICD-10-CM | POA: Insufficient documentation

## 2015-06-11 DIAGNOSIS — R252 Cramp and spasm: Secondary | ICD-10-CM | POA: Insufficient documentation

## 2015-11-21 ENCOUNTER — Emergency Department (HOSPITAL_COMMUNITY)
Admission: EM | Admit: 2015-11-21 | Discharge: 2015-11-21 | Disposition: A | Discharge status: Home / Self Care | Payer: Medicare Other | Attending: Emergency Medicine | Admitting: Emergency Medicine

## 2015-11-21 ENCOUNTER — Encounter (HOSPITAL_COMMUNITY): Payer: Self-pay

## 2015-11-21 DIAGNOSIS — R61 Generalized hyperhidrosis: Secondary | ICD-10-CM | POA: Diagnosis not present

## 2015-11-21 DIAGNOSIS — Z79899 Other long term (current) drug therapy: Secondary | ICD-10-CM | POA: Diagnosis not present

## 2015-11-21 DIAGNOSIS — Z794 Long term (current) use of insulin: Secondary | ICD-10-CM | POA: Insufficient documentation

## 2015-11-21 DIAGNOSIS — E119 Type 2 diabetes mellitus without complications: Secondary | ICD-10-CM | POA: Insufficient documentation

## 2015-11-21 DIAGNOSIS — R252 Cramp and spasm: Secondary | ICD-10-CM | POA: Diagnosis not present

## 2015-11-21 DIAGNOSIS — Z7984 Long term (current) use of oral hypoglycemic drugs: Secondary | ICD-10-CM | POA: Insufficient documentation

## 2015-11-21 DIAGNOSIS — Z87891 Personal history of nicotine dependence: Secondary | ICD-10-CM | POA: Insufficient documentation

## 2015-11-21 DIAGNOSIS — Q6231 Congenital ureterocele, orthotopic: Secondary | ICD-10-CM | POA: Insufficient documentation

## 2015-11-21 DIAGNOSIS — E860 Dehydration: Secondary | ICD-10-CM | POA: Diagnosis not present

## 2015-11-21 DIAGNOSIS — I10 Essential (primary) hypertension: Secondary | ICD-10-CM | POA: Insufficient documentation

## 2015-11-21 LAB — BASIC METABOLIC PANEL
Anion gap: 11 (ref 5–15)
BUN: 29 mg/dL — ABNORMAL HIGH (ref 6–20)
CALCIUM: 10.2 mg/dL (ref 8.9–10.3)
CO2: 25 mmol/L (ref 22–32)
CREATININE: 1.67 mg/dL — AB (ref 0.61–1.24)
Chloride: 101 mmol/L (ref 101–111)
GFR, EST AFRICAN AMERICAN: 45 mL/min — AB (ref >60)
GFR, EST NON AFRICAN AMERICAN: 38 mL/min — AB (ref >60)
Glucose, Bld: 131 mg/dL — ABNORMAL HIGH (ref 65–99)
Potassium: 4.3 mmol/L (ref 3.5–5.1)
SODIUM: 137 mmol/L (ref 135–145)

## 2015-11-21 LAB — CBC WITH DIFFERENTIAL/PLATELET
BASOS PCT: 0 %
Basophils Absolute: 0 10*3/uL (ref 0.0–0.1)
EOS ABS: 0.1 10*3/uL (ref 0.0–0.7)
EOS PCT: 1 %
HCT: 42.7 % (ref 39.0–52.0)
HEMOGLOBIN: 13.9 g/dL (ref 13.0–17.0)
LYMPHS PCT: 14 %
Lymphs Abs: 1.2 10*3/uL (ref 0.7–4.0)
MCH: 26.8 pg (ref 26.0–34.0)
MCHC: 32.6 g/dL (ref 30.0–36.0)
MCV: 82.4 fL (ref 78.0–100.0)
MONOS PCT: 4 %
Monocytes Absolute: 0.4 10*3/uL (ref 0.1–1.0)
NEUTROS ABS: 7 10*3/uL (ref 1.7–7.7)
NEUTROS PCT: 81 %
PLATELETS: 204 10*3/uL (ref 150–400)
RBC: 5.18 MIL/uL (ref 4.22–5.81)
RDW: 12.8 % (ref 11.5–15.5)
WBC: 8.7 10*3/uL (ref 4.0–10.5)

## 2015-11-21 LAB — CK: CK TOTAL: 1511 U/L — AB (ref 49–397)

## 2015-11-21 LAB — MAGNESIUM: Magnesium: 2.7 mg/dL — ABNORMAL HIGH (ref 1.7–2.4)

## 2015-11-21 LAB — TROPONIN I

## 2015-11-21 MED ORDER — SODIUM CHLORIDE 0.9 % IV BOLUS (SEPSIS)
500.0000 mL | Freq: Once | INTRAVENOUS | Status: AC
  Administered 2015-11-21: 500 mL via INTRAVENOUS

## 2015-11-21 NOTE — Discharge Instructions (Signed)
Stay well hydrated. Hold cholesterol statin medicine until you see your doctor.  If you were given medicines take as directed.  If you are on coumadin or contraceptives realize their levels and effectiveness is altered by many different medicines.  If you have any reaction (rash, tongues swelling, other) to the medicines stop taking and see a physician.    If your blood pressure was elevated in the ER make sure you follow up for management with a primary doctor or return for chest pain, shortness of breath or stroke symptoms.  Please follow up as directed and return to the ER or see a physician for new or worsening symptoms.  Thank you. Filed Vitals:   11/21/15 1400  BP: 145/66  Pulse: 77  Temp: 98.1 F (36.7 C)  TempSrc: Oral  SpO2: 95%

## 2015-11-21 NOTE — ED Provider Notes (Signed)
CSN: [id number]     Arrival date & time 11/21/15  1350 History   First MD Initiated Contact with Patient 11/21/15 1503     Chief Complaint  Patient presents with  . Leg Pain     (Consider location/radiation/quality/duration/timing/severity/associated sxs/prior Treatment) HPI Comments: 75 year-old male with history of diabetes, lipids, high blood pressure presents with cramping. Patient's had intermittent cramping in the hands and arms for over a year however he developed cramping in the bilateral lower extremities worse recently. Today he was outside in the heat watching a track and field me in the cramping got constant and more severe than he ever had. No swelling in the legs is new. Past smoker. Patient is on Lasix. Patient says he's been drinking fluids regularly today. No fevers or chills. Patient did feel hot and mild sweating but he says is normal for him. No chest pain or shortness of breath. Patient feels well currently with no significant cramping. Sugar was 200s.  Patient is a 75 y.o. male presenting with leg pain. The history is provided by the patient.  Leg Pain Associated symptoms: no back pain, no fever and no neck pain     Past Medical History  Diagnosis Date  . Congenital ureterocele   . Diabetes mellitus without complication ([place])   . Hypertension    Past Surgical History  Procedure Laterality Date  . Eye surgery     No family history on file. Social History  Substance Use Topics  . Smoking status: Former Research scientist (life sciences)  . Smokeless tobacco: None  . Alcohol Use: No    Review of Systems  Constitutional: Positive for diaphoresis. Negative for fever and chills.  HENT: Negative for congestion.   Eyes: Negative for visual disturbance.  Respiratory: Negative for shortness of breath.   Cardiovascular: Negative for chest pain.  Gastrointestinal: Negative for vomiting and abdominal pain.  Genitourinary: Negative for dysuria and flank pain.  Musculoskeletal: Positive for  arthralgias. Negative for back pain, neck pain and neck stiffness.  Skin: Negative for rash.  Neurological: Negative for light-headedness and headaches.      Allergies  Review of patient's allergies indicates no known allergies.  Home Medications   Prior to Admission medications   Medication Sig Start Date End Date Taking? Authorizing Provider  benazepril (LOTENSIN) 40 MG tablet Take 40 mg by mouth daily.    Historical Provider, MD  carvedilol (COREG) 6.25 MG tablet Take 6.25 mg by mouth 2 (two) times daily with a meal.    Historical Provider, MD  HYDROcodone-acetaminophen (NORCO/VICODIN) 5-325 MG per tablet Take 2 tablets by mouth every 4 (four) hours as needed for moderate pain or severe pain. Patient not taking: Reported on 09/28/2014 02/06/14   Deno Etienne, DO  Insulin Glargine (LANTUS SOLOSTAR) 100 UNIT/ML Solostar Pen Inject 50 Units into the skin daily.    Historical Provider, MD  insulin lispro (HUMALOG KWIKPEN) 100 UNIT/ML KiwkPen Inject 12 Units into the skin 3 (three) times daily.    Historical Provider, MD  metFORMIN (GLUMETZA) 1000 MG (MOD) 24 hr tablet Take 1,000 mg by mouth daily with breakfast.    Historical Provider, MD  ondansetron (ZOFRAN ODT) 4 MG disintegrating tablet Take 1 tablet (4 mg total) by mouth every 8 (eight) hours as needed for nausea or vomiting. Patient not taking: Reported on 09/28/2014 02/06/14   Deno Etienne, DO  rosuvastatin (CRESTOR) 20 MG tablet Take 20 mg by mouth at bedtime.    Historical Provider, MD  triamterene-hydrochlorothiazide (MAXZIDE-25) 37.5-25 MG  per tablet Take 1 tablet by mouth daily.    Historical Provider, MD   BP 181/81 mmHg  Pulse 78  Temp(Src) 98.1 F (36.7 C) (Oral)  Resp 17  SpO2 98% Physical Exam  Constitutional: He is oriented to person, place, and time. He appears well-developed and well-nourished.  HENT:  Head: Normocephalic and atraumatic.  Dry mucous membranes  Eyes: Conjunctivae are normal. Right eye exhibits no  discharge. Left eye exhibits no discharge.  Neck: Normal range of motion. Neck supple. No tracheal deviation present.  Cardiovascular: Normal rate and regular rhythm.   Pulmonary/Chest: Effort normal and breath sounds normal.  Abdominal: Soft. He exhibits no distension. There is no tenderness. There is no guarding.  Musculoskeletal: He exhibits no edema.  Neurological: He is alert and oriented to person, place, and time.  Skin: Skin is warm. No rash noted.  Psychiatric: He has a normal mood and affect.  Nursing note and vitals reviewed.   ED Course  Procedures (including critical care time) Labs Review Labs Reviewed  BASIC METABOLIC PANEL - Abnormal; Notable for the following:    Glucose, Bld 131 (*)    BUN 29 (*)    Creatinine, Ser 1.67 (*)    GFR calc non Af Amer 38 (*)    GFR calc Af Amer 45 (*)    All other components within normal limits  CK - Abnormal; Notable for the following:    Total CK 1511 (*)    All other components within normal limits  MAGNESIUM - Abnormal; Notable for the following:    Magnesium 2.7 (*)    All other components within normal limits  CBC WITH DIFFERENTIAL/PLATELET  TROPONIN I    Imaging Review No results found. I have personally reviewed and evaluated these images and lab results as part of my medical decision-making.   EKG Interpretation   Date/Time:  Saturday Nov 21 2015 15:51:34 EDT Ventricular Rate:  72 PR Interval:  186 QRS Duration: 92 QT Interval:  399 QTC Calculation: 437 R Axis:   -36 Text Interpretation:  Sinus rhythm Left axis deviation Probable  anteroseptal infarct, old similar to previous Confirmed by Kalley Nicholl MD,  Edit Ricciardelli [phone]) on 11/21/2015 4:21:10 PM      MDM   Final diagnoses:  Cramp of both lower extremities  Dehydration   Patient presents with cramping and mild diaphoresis. Plan for screening load work for electrolytes, troponin EKG. Patient feels well currently with no symptoms. Patient requesting to follow  up outpatient. IV and oral fluids ordered.  Pt no sxs in ED.  Discussed holding cholesterol med and staying hydrated.  Results and differential diagnosis were discussed with the patient/parent/guardian. Xrays were independently reviewed by myself.  Close follow up outpatient was discussed, comfortable with the plan.   Medications  sodium chloride 0.9 % bolus 500 mL (0 mLs Intravenous Stopped 11/21/15 1702)    Filed Vitals:   11/21/15 1430 11/21/15 1600 11/21/15 1630 11/21/15 1700  BP: 141/78 159/71 163/75 181/81  Pulse: 72 70 74 78  Temp:      TempSrc:      Resp: 19 19 18 17   SpO2: 96% 96% 95% 98%    Final diagnoses:  Cramp of both lower extremities  Dehydration       Elnora Morrison, MD 11/21/15 1730

## 2015-11-21 NOTE — ED Notes (Signed)
Pt. BIB [facility] for evaluation of bilateral lower leg cramping. Pt. Has had intermittent cramping x few weeks, seen by PCP and has been eating mustard with no improvement. Pt. Was taking photos at outdoor track meet this AM, reports very diaphoretic, normal urine output. Pt. States he initially felt weak and lethargic, took oral glucose and orange juice. CBG 283.

## 2016-03-02 DIAGNOSIS — Z87898 Personal history of other specified conditions: Secondary | ICD-10-CM | POA: Insufficient documentation

## 2016-06-21 DIAGNOSIS — B353 Tinea pedis: Secondary | ICD-10-CM | POA: Insufficient documentation

## 2016-07-12 DIAGNOSIS — L84 Corns and callosities: Secondary | ICD-10-CM | POA: Insufficient documentation

## 2016-07-13 ENCOUNTER — Encounter: Payer: Self-pay | Admitting: Podiatry

## 2016-07-13 ENCOUNTER — Ambulatory Visit (INDEPENDENT_AMBULATORY_CARE_PROVIDER_SITE_OTHER): Payer: Medicare Other | Admitting: Podiatry

## 2016-07-13 VITALS — BP 176/82 | HR 67 | Temp 98.2°F | Resp 18

## 2016-07-13 DIAGNOSIS — L309 Dermatitis, unspecified: Secondary | ICD-10-CM | POA: Diagnosis not present

## 2016-07-13 DIAGNOSIS — R0989 Other specified symptoms and signs involving the circulatory and respiratory systems: Secondary | ICD-10-CM

## 2016-07-13 NOTE — Patient Instructions (Signed)
Approximately 1 month history of discolored skin lesions on the bottom of your right and left feet that you're currently treating with Lamisil cream without any improvement. Am unsure of the specific diagnosis and I am recommending that you contact a dermatologist for a definitive diagnosis. [facility] could be one option Also I had difficulty feeling 1 pulses on the top of your left foot and I'm referring you to vascular lab to evaluate his circulation in your legs and feet and we contact you with the results of the exam    Diabetes and Foot Care Diabetes may cause you to have problems because of poor blood supply (circulation) to your feet and legs. This may cause the skin on your feet to become thinner, break easier, and heal more slowly. Your skin may become dry, and the skin may peel and crack. You may also have nerve damage in your legs and feet causing decreased feeling in them. You may not notice minor injuries to your feet that could lead to infections or more serious problems. Taking care of your feet is one of the most important things you can do for yourself. Follow these instructions at home:  Wear shoes at all times, even in the house. Do not go barefoot. Bare feet are easily injured.  Check your feet daily for blisters, cuts, and redness. If you cannot see the bottom of your feet, use a mirror or ask someone for help.  Wash your feet with warm water (do not use hot water) and mild soap. Then pat your feet and the areas between your toes until they are completely dry. Do not soak your feet as this can dry your skin.  Apply a moisturizing lotion or petroleum jelly (that does not contain alcohol and is unscented) to the skin on your feet and to dry, brittle toenails. Do not apply lotion between your toes.  Trim your toenails straight across. Do not dig under them or around the cuticle. File the edges of your nails with an emery board or nail file.  Do not cut corns or  calluses or try to remove them with medicine.  Wear clean socks or stockings every day. Make sure they are not too tight. Do not wear knee-high stockings since they may decrease blood flow to your legs.  Wear shoes that fit properly and have enough cushioning. To break in new shoes, wear them for just a few hours a day. This prevents you from injuring your feet. Always look in your shoes before you put them on to be sure there are no objects inside.  Do not cross your legs. This may decrease the blood flow to your feet.  If you find a minor scrape, cut, or break in the skin on your feet, keep it and the skin around it clean and dry. These areas may be cleansed with mild soap and water. Do not cleanse the area with peroxide, alcohol, or iodine.  When you remove an adhesive bandage, be sure not to damage the skin around it.  If you have a wound, look at it several times a day to make sure it is healing.  Do not use heating pads or hot water bottles. They may burn your skin. If you have lost feeling in your feet or legs, you may not know it is happening until it is too late.  Make sure your health care provider performs a complete foot exam at least annually or more often if you have foot  problems. Report any cuts, sores, or bruises to your health care provider immediately. Contact a health care provider if:  You have an injury that is not healing.  You have cuts or breaks in the skin.  You have an ingrown nail.  You notice redness on your legs or feet.  You feel burning or tingling in your legs or feet.  You have pain or cramps in your legs and feet.  Your legs or feet are numb.  Your feet always feel cold. Get help right away if:  There is increasing redness, swelling, or pain in or around a wound.  There is a red line that goes up your leg.  Pus is coming from a wound.  You develop a fever or as directed by your health care provider.  You notice a bad smell coming from an  ulcer or wound. This information is not intended to replace advice given to you by your health care provider. Make sure you discuss any questions you have with your health care provider. Document Released: 06/24/2000 Document Revised: 12/03/2015 Document Reviewed: 12/04/2012 Elsevier Interactive Patient Education  2017 [employer].

## 2016-07-13 NOTE — Progress Notes (Signed)
   Subjective:    Patient ID: Xavier [name], male    DOB: [birth year], 76 y.o.   MRN: [id number]  HPI       patient is complaining of mild soreness and tenderness and bilateral skin lesions when standing and with direct pressure present for approximately 3-4 weeks. He describes applying Lamisil cream daily to the skin lesions 3 weeks without any change in the appearance or sensation in these areas. He denies any other areas of similar involvement    patient is diabetic and denies any history of foot ulceration , claudication or amputation   patient denies history of smoking     Review of Systems  All other systems reviewed and are negative.      Objective:   Physical Exam   orientated 3     vascular  DP pulse right 2/4 and DP pulse left 0/4  PT pulses 2/4 right and 2/4 left   capillary reflex delayed bilaterally    neurological:  sensation to 10 g monofilament wire intact 4/5 bilaterally  ankle reflexes reactive bilaterally  vibratory sensation intact bilaterally  Dermatological:  no open skin lesions bilaterally   plantar right arch has diffuse Brown hyperpigmented lesions 3 cm x 1 cm.   small callus fifth MPJ right and left  plantar left  Also has some Brown hyperpigmentation noted diffuse lesions   both these areas hyperpigmentation demonstrates no surrounding erythema, edema or drainage   mild plantar callus fifth MPJ left   Musculoskeletal:  dorsi flexion, plantar flexion 5/5 bilaterally   :       Assessment & Plan:    assessment:  diabetic with absent pedal pulse   plantar skin lesions undetermined origin   Plan:  today I reviewed the results of exam with patient today and I am referring patient to the vascular lab for lower extremity arterial Doppler with TBI is and ABIs for the indication of diabetic with absent pedal pulse   also I informed patient I was unsure of the diagnosis of the plantar skin lesions  Bilaterally and I am referring patient  to  [facility] dermatology to have these plantar skin lesions evaluated    notify patient of results of that vascular lab

## 2016-07-14 ENCOUNTER — Other Ambulatory Visit: Payer: Self-pay | Admitting: Podiatry

## 2016-07-14 DIAGNOSIS — R0989 Other specified symptoms and signs involving the circulatory and respiratory systems: Secondary | ICD-10-CM

## 2016-07-15 ENCOUNTER — Ambulatory Visit (HOSPITAL_COMMUNITY)
Admission: RE | Admit: 2016-07-15 | Discharge: 2016-07-15 | Disposition: A | Discharge status: Home / Self Care | Payer: Medicare Other | Source: Ambulatory Visit | Attending: Podiatry | Admitting: Podiatry

## 2016-07-15 DIAGNOSIS — E785 Hyperlipidemia, unspecified: Secondary | ICD-10-CM | POA: Diagnosis not present

## 2016-07-15 DIAGNOSIS — Z87891 Personal history of nicotine dependence: Secondary | ICD-10-CM | POA: Diagnosis not present

## 2016-07-15 DIAGNOSIS — R0989 Other specified symptoms and signs involving the circulatory and respiratory systems: Secondary | ICD-10-CM | POA: Insufficient documentation

## 2016-07-15 DIAGNOSIS — I700 Atherosclerosis of aorta: Secondary | ICD-10-CM | POA: Diagnosis not present

## 2016-07-15 DIAGNOSIS — E119 Type 2 diabetes mellitus without complications: Secondary | ICD-10-CM | POA: Diagnosis not present

## 2016-07-15 DIAGNOSIS — R938 Abnormal findings on diagnostic imaging of other specified body structures: Secondary | ICD-10-CM | POA: Insufficient documentation

## 2016-07-15 DIAGNOSIS — I10 Essential (primary) hypertension: Secondary | ICD-10-CM | POA: Insufficient documentation

## 2016-07-15 DIAGNOSIS — I771 Stricture of artery: Secondary | ICD-10-CM | POA: Diagnosis not present

## 2016-07-15 DIAGNOSIS — I708 Atherosclerosis of other arteries: Secondary | ICD-10-CM | POA: Diagnosis not present

## 2016-07-19 ENCOUNTER — Ambulatory Visit: Payer: Medicare Other | Admitting: Cardiovascular Disease

## 2016-07-19 NOTE — Progress Notes (Deleted)
Cardiology Office Note   Date:  07/19/2016   ID:  Xavier [name], DOB [birth year], MRN [id number]  PCP:  Bridgett Larsson, MD  Cardiologist:   Kathlyn Sacramento, MD  (new)  No chief complaint on file.     History of Present Illness: Xavier [name] is a 76 y.o. male who was referred by Dr. Amalia Hailey for evaluation and management of peripheral arterial disease. The patient has known history of type 2 diabetes. He is a lifelong nonsmoker. Noninvasive vascular evaluation showed an ABI of 0.67 on the right and 0.91 on the left. Duplex showed mild aortoiliac disease, moderate right SFA disease with two-vessel runoff below the knee. There was mild disease affecting the left lower extremity.  Past Medical History:  Diagnosis Date  . Congenital ureterocele   . Diabetes mellitus without complication ([place])   . Hypertension     Past Surgical History:  Procedure Laterality Date  . EYE SURGERY       Current Outpatient Prescriptions  Medication Sig Dispense Refill  . benazepril (LOTENSIN) 40 MG tablet Take 40 mg by mouth daily.    . carvedilol (COREG) 6.25 MG tablet Take 6.25 mg by mouth 2 (two) times daily with a meal.    . cholecalciferol (VITAMIN D) 1000 units tablet Take 1,000 Units by mouth daily.    Marland Kitchen HUMULIN R U-500 KWIKPEN 500 UNIT/ML kwikpen Inject 45 Units into the skin 3 (three) times daily with meals.   11  . IRON PO Take 1 tablet by mouth daily.    Marland Kitchen POTASSIUM PO Take 1 tablet by mouth daily.    . rosuvastatin (CRESTOR) 20 MG tablet Take 20 mg by mouth at bedtime.    . triamterene-hydrochlorothiazide (MAXZIDE-25) 37.5-25 MG per tablet Take 1 tablet by mouth daily.    Marland Kitchen ZETIA 10 MG tablet Take 10 mg by mouth at bedtime.  0   No current facility-administered medications for this visit.     Allergies:   Patient has no known allergies.    Social History:  The patient  reports that he has quit smoking. He does not have any smokeless tobacco history on file. He reports that  he does not drink alcohol or use drugs.   Family History:  The patient's ***family history is not on file.    ROS:  Please see the history of present illness.   Otherwise, review of systems are positive for {NONE DEFAULTED:18576::"none"}.   All other systems are reviewed and negative.    PHYSICAL EXAM: VS:  There were no vitals taken for this visit. , BMI There is no height or weight on file to calculate BMI. GEN: Well nourished, well developed, in no acute distress  HEENT: normal  Neck: no JVD, carotid bruits, or masses Cardiac: ***RRR; no murmurs, rubs, or gallops,no edema  Respiratory:  clear to auscultation bilaterally, normal work of breathing GI: soft, nontender, nondistended, + BS MS: no deformity or atrophy  Skin: warm and dry, no rash Neuro:  Strength and sensation are intact Psych: euthymic mood, full affect   EKG:  EKG {ACTION; IS/IS [id number] ordered today. The ekg ordered today demonstrates ***   Recent Labs: 11/21/2015: BUN 29; Creatinine, Ser 1.67; Hemoglobin 13.9; Magnesium 2.7; Platelets 204; Potassium 4.3; Sodium 137    Lipid Panel No results found for: CHOL, TRIG, HDL, CHOLHDL, VLDL, LDLCALC, LDLDIRECT    Wt Readings from Last 3 Encounters:  02/06/14 200 lb (90.7 kg)  01/20/09 207 lb (93.9 kg)  Other studies Reviewed: Additional studies/ records that were reviewed today include: ***. Review of the above records demonstrates: ***  No flowsheet data found.    ASSESSMENT AND PLAN:  1.  ***    Disposition:   FU with *** in {gen number [id number] {Days to years:10300}  Signed,  Kathlyn Sacramento, MD  07/19/2016 10:03 AM    [place]

## 2016-07-21 ENCOUNTER — Telehealth: Payer: Self-pay | Admitting: *Deleted

## 2016-07-21 NOTE — Telephone Encounter (Deleted)
-----   Message from Gean Birchwood, DPM sent at 07/19/2016  7:49 AM EST -----  Lower extremity arterial Doppler dated 07/16/2016 reviewed and abnormal Schedule follow-up for this arterial Doppler with Dr. Audelia Acton on 01/09/thousand 18 Contact patient and confirm patient's follow-up with Dr.Arrida  Waveforms suggest run-off disease, bilaterally. Right ABI is in the moderate range of flow reduction. Left ABI is in the mild range.  Abnormal great toe-brachial indices, bilaterally. PPG's of all ten toes are abnormal, with damped waveforms noted in the left second and fourth toes. See Arterial Duplex study. Scheduled to see Dr. Fletcher Anon 07-19-16.

## 2016-07-21 NOTE — Telephone Encounter (Addendum)
-----   Message from Gean Birchwood, DPM sent at 07/19/2016  7:49 AM EST -----  Lower extremity arterial Doppler dated 07/16/2016 reviewed and abnormal Schedule follow-up for this arterial Doppler with Dr. Audelia Acton on 01/09/thousand 18 Contact patient and confirm patient's follow-up with Dr.Arrida  Waveforms suggest run-off disease, bilaterally. Right ABI is in the moderate range of flow reduction. Left ABI is in the mild range.  Abnormal great toe-brachial indices, bilaterally. PPG's of all ten toes are abnormal, with damped waveforms noted in the left second and fourth toes. See Arterial Duplex study. Scheduled to see Dr. Fletcher Anon 07-19-16. 07/21/2016-I informed pt of Dr. Phoebe Perch review of the dopplers and reminded pt of his 08/09/2016 9:15am appt with Dr. Fletcher Anon.

## 2016-08-09 ENCOUNTER — Ambulatory Visit (INDEPENDENT_AMBULATORY_CARE_PROVIDER_SITE_OTHER): Payer: Medicare Other | Admitting: Cardiovascular Disease

## 2016-08-09 ENCOUNTER — Encounter: Payer: Self-pay | Admitting: Cardiovascular Disease

## 2016-08-09 VITALS — BP 166/78 | HR 66 | Ht 73.0 in | Wt 203.0 lb

## 2016-08-09 DIAGNOSIS — E785 Hyperlipidemia, unspecified: Secondary | ICD-10-CM | POA: Diagnosis not present

## 2016-08-09 DIAGNOSIS — I739 Peripheral vascular disease, unspecified: Secondary | ICD-10-CM | POA: Diagnosis not present

## 2016-08-09 DIAGNOSIS — I10 Essential (primary) hypertension: Secondary | ICD-10-CM

## 2016-08-09 MED ORDER — ASPIRIN EC 81 MG PO TBEC: 81.0000 mg | DELAYED_RELEASE_TABLET | Freq: Every day | ORAL | Status: DC

## 2016-08-09 NOTE — Progress Notes (Signed)
Cardiology Office Note   Date:  08/09/2016   ID:  Xavier [name], DOB [birth year], MRN [id number]  PCP:  Bridgett Larsson, MD  Cardiologist:  New  Chief Complaint  Patient presents with  . New Patient (Initial Visit)  . Leg Pain    cramping in legs occassionally.      History of Present Illness: Xavier [name] is a 76 y.o. male who was referred by Dr. Amalia Hailey for evaluation and management of peripheral arterial disease. The patient has known history of diabetes, hyperlipidemia and hypertension. He quit smoking in the 80s. He was seen recently by Dr. Amalia Hailey for evaluation of skin lesions on the bottom of both feet. He was noted to have diminished distal pulses and thus he was referred for lower extremity arterial Doppler. He was subsequently seen by dermatology and was diagnosed with eczema. He describes minimal discomfort in his legs with very prolonged walking but he is not limited by this. The discomfort is equal in both legs. He has cramps in both legs below the knee on the front mostly at rest. He has no lower extremity ulceration or wounds. His blood pressure is elevated today but he did not take his blood pressure medications. He has no family history of premature coronary artery disease although he reports that his father died at the age of 37 during cardiac catheterization.    Past Medical History:  Diagnosis Date  . Congenital ureterocele   . Diabetes mellitus without complication ([place])   . Hypertension   . Prostate cancer [facility])     Past Surgical History:  Procedure Laterality Date  . EYE SURGERY       Current Outpatient Prescriptions  Medication Sig Dispense Refill  . atorvastatin (LIPITOR) 40 MG tablet Take 40 mg by mouth daily.    . benazepril (LOTENSIN) 40 MG tablet Take 40 mg by mouth daily.    . carvedilol (COREG) 6.25 MG tablet Take 6.25 mg by mouth 2 (two) times daily with a meal.    . HUMULIN R U-500 KWIKPEN 500 UNIT/ML kwikpen Inject 45 Units  into the skin 3 (three) times daily with meals.   11  . triamterene-hydrochlorothiazide (MAXZIDE-25) 37.5-25 MG per tablet Take 1 tablet by mouth daily.     No current facility-administered medications for this visit.     Allergies:   Patient has no known allergies.    Social History:  The patient  reports that he has quit smoking. He has never used smokeless tobacco. He reports that he does not drink alcohol or use drugs.   Family History:  The patient's Family history: No premature coronary artery disease. Father died at the age of 73 during cardiac catheterization. Mother died of cerebral hemorrhage.  ROS:  Please see the history of present illness.   Otherwise, review of systems are positive for none.   All other systems are reviewed and negative.    PHYSICAL EXAM: VS:  BP (!) 166/78   Pulse 66   Ht 6\' 1"  (1.854 m)   Wt 203 lb (92.1 kg)   BMI 26.78 kg/m  , BMI Body mass index is 26.78 kg/m. GEN: Well nourished, well developed, in no acute distress  HEENT: normal  Neck: no JVD, carotid bruits, or masses Cardiac: RRR; no murmurs, rubs, or gallops,no edema  Respiratory:  clear to auscultation bilaterally, normal work of breathing GI: soft, nontender, nondistended, + BS MS: no deformity or atrophy  Skin: warm and dry, no rash Neuro:  Strength and sensation are intact Psych: euthymic mood, full affect Vascular: Femoral pulses normal bilaterally. Posterior tibial: +1 on the right and +2 on the left. Dorsalis pedis is not palpable.  EKG:  EKG is ordered today. The ekg ordered today demonstrates  Normal sinus rhythm with no significant ST or T wave changes.   Recent Labs: 11/21/2015: BUN 29; Creatinine, Ser 1.67; Hemoglobin 13.9; Magnesium 2.7; Platelets 204; Potassium 4.3; Sodium 137    Lipid Panel No results found for: CHOL, TRIG, HDL, CHOLHDL, VLDL, LDLCALC, LDLDIRECT    Wt Readings from Last 3 Encounters:  08/09/16 203 lb (92.1 kg)  02/06/14 200 lb (90.7 kg)    01/20/09 207 lb (93.9 kg)        No flowsheet data found.    ASSESSMENT AND PLAN:  1.  Peripheral arterial disease: Noninvasive vascular evaluation showed an ABI of 0.67 on the right and 0.91 on the left. Duplex showed moderate right SFA disease .The patient has minimal claudication which does not seem to be lifestyle limiting. There is no evidence of critical limb ischemia. Thus, I recommend medical therapy. I discussed with him the natural history of peripheral arterial disease. I added aspirin 81 mg once daily and recommend treatment of risk factors. We also discussed the importance of foot hygiene.  2. Essential hypertension: Blood pressure is elevated but he does not take his blood pressure medications on a regular basis. I discussed the importance of compliance with medications.  3. Hyperlipidemia: Both atorvastatin and rosuvastatin are among his medications but he reports that he only takes atorvastatin. I recommend a target LDL of less than 70.    Disposition:   FU with me in 6 months  Signed,  Kathlyn Sacramento, MD  08/09/2016 9:55 AM    [address]

## 2016-08-09 NOTE — Patient Instructions (Signed)
Medication Instructions:  Your physician has recommended you make the following change in your medication:  1. START Aspirin 81mg  take one tablet by mouth daily  Labwork: No new orders.   Testing/Procedures: No new orders.   Follow-Up: Your physician wants you to follow-up in: 6 MONTHS with Dr Fletcher Anon.  You will receive a reminder letter in the mail two months in advance. If you don't receive a letter, please call our office to schedule the follow-up appointment.   Any Other Special Instructions Will Be Listed Below (If Applicable).     If you need a refill on your cardiac medications before your next appointment, please call your pharmacy.

## 2016-08-11 ENCOUNTER — Telehealth: Payer: Self-pay | Admitting: *Deleted

## 2016-08-11 NOTE — Telephone Encounter (Deleted)
-----   Message from Gean Birchwood, DPM sent at 08/09/2016  7:50 AM EST -----  Impressions Graft Native LEFT CIA EIA CFA DFA SFA Origin SFA Mid SFA Distal Pop PT DP Per Anastomosis Graft Level 1 Graft Level 2 Graft Level 3 Distal 121 109 135 141 185 186 53 292 51 45  occl CIA EIA CFA DFA SFA Origin SFA Mid SFA Distal Pop PT DP Per 142 140 187 249 144 68 134 43 91 29  Graft Level 1 Graft Level 2 Graft Level 3 Aorta Native Graft CM/sec CM/sec CM/sec CM/sec 74 64 Proximal Anastomosis RIGHT Anastomosis Proximal Distal Anastomosis ABI .67 .91 Brachial Brachial Ankle Ankle DP PT DP PT ABI Examination Data Technically challenging study.  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  Heterogeneous plaque in both lower extremities.  Two areas of stenosis in the right SFA, one is 30-49% at the ostium and the other is 50-74% in the mid to distal segment.  No evidence of stenosis in the left lower extremity.  Two vessel run-off in the right lower extremity, with occluded right anterior tibial artery.  Three vessel run-off in the right lower extremity.  See Arterial Doppler report.  Observations: Risk Factors: Patient is a 76 year old male that presents with absent pedal pulses. Patient denies any lower extremity pain.  Today's ABI's were 0.67 on the right and 0.91 on the left. Diabetes:Yes, Tobacco:Hx, Hyperlipidemia:Yes, History of Tobacco Use:Yes, Hypertension:Yes Tobacco:Hx, Diabetes:Yes, Hypertension:Yes, Absent Pulse:Yes, Hyperlipidemia:Yes History Notes:  Technically challenging study.  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  Heterogeneous plaque in both lower extremities.  Two areas of stenosis in the right SFA, one is 30-49% at the ostium and the other is 50-74% in the mid to distal segment. No evidence of stenosis in the left lower extremity. Two vessel run-off in the right lower extremity,  with occluded right anterior tibial artery. Three vessel run-off in the right lower extremity Scheduled to see Dr. Fletcher Anon 07-19-16. Follow-up by Dr. Fletcher Anon

## 2016-08-11 NOTE — Telephone Encounter (Addendum)
Dr. Amalia Hailey reviewed pt's arterial dopplers and recommend pt follow up with Dr. Fletcher Anon. Left message requesting pt call for instructions and results. Pt called for results. Left message for pt to call and have me pulled from a room or off the phone to give instructions. I spoke with pt and he said Dr. Fletcher Anon gave him his results and told him to follow up again in 6 months.

## 2016-08-25 DIAGNOSIS — I739 Peripheral vascular disease, unspecified: Secondary | ICD-10-CM | POA: Insufficient documentation

## 2016-09-14 ENCOUNTER — Telehealth: Payer: Self-pay | Admitting: *Deleted

## 2016-09-14 NOTE — Telephone Encounter (Addendum)
-----   Message from Gean Birchwood, DPM sent at 09/14/2016  7:49 AM EST ----- Follow-up on this arterial Doppler scheduled with Dr.Arida on 07/19/2016 Confirm patient had follow-up  Two areas of stenosis in the right SFA, one is 30-49% at the ostium and the other is 50-74% in the mid to distal segment. No evidence of stenosis in the left lower extremity. Two vessel run-off in the right lower extremity, with occluded right anterior tibial artery. Three vessel run-off in the right lower extremity Scheduled to see Dr. Fletcher Anon 07-19-16. Follow-up by Dr. Fletcher Anon. Left message requesting status of appt with Dr. Fletcher Anon. 09/19/2016-Pt states 91% in Left leg and 67% in right leg from Dr. Fletcher Anon visit.

## 2016-11-22 DIAGNOSIS — D126 Benign neoplasm of colon, unspecified: Secondary | ICD-10-CM | POA: Insufficient documentation

## 2016-12-22 ENCOUNTER — Encounter: Payer: Self-pay | Admitting: Gastroenterology

## 2017-01-30 ENCOUNTER — Telehealth: Payer: Self-pay | Admitting: *Deleted

## 2017-01-30 NOTE — Telephone Encounter (Signed)
Yes, please - 2 day prep

## 2017-01-30 NOTE — Telephone Encounter (Signed)
Noted. Thanks.

## 2017-01-30 NOTE — Telephone Encounter (Signed)
Patient is for recall colon on 02/22/17. His last colonoscopy was on 03/04/09 with Dr.Patterson. He had a poor prep using the Moviprep. Would you like patient to have the 2 day prep Suprep/Miralax? Please advise. Thank you, Lugene Hitt

## 2017-02-08 ENCOUNTER — Ambulatory Visit (AMBULATORY_SURGERY_CENTER): Payer: Self-pay

## 2017-02-08 ENCOUNTER — Encounter: Payer: Self-pay | Admitting: Gastroenterology

## 2017-02-08 VITALS — Ht 73.5 in | Wt 183.0 lb

## 2017-02-08 DIAGNOSIS — Z860100 Personal history of colon polyps, unspecified: Secondary | ICD-10-CM

## 2017-02-08 MED ORDER — SUPREP BOWEL PREP KIT 17.5-3.13-1.6 GM/177ML PO SOLN: 1.0000 | Freq: Once | ORAL | 0 refills | Status: AC

## 2017-02-08 MED ORDER — SUPREP BOWEL PREP KIT 17.5-3.13-1.6 GM/177ML PO SOLN: 1.0000 | Freq: Once | ORAL | 0 refills | Status: DC

## 2017-02-08 NOTE — Progress Notes (Signed)
No allergies to eggs or soy No diet meds No home oxygen No past problems with anesthesia  Declined emmi 

## 2017-02-14 ENCOUNTER — Ambulatory Visit (INDEPENDENT_AMBULATORY_CARE_PROVIDER_SITE_OTHER): Payer: Medicare Other | Admitting: Cardiovascular Disease

## 2017-02-14 ENCOUNTER — Encounter: Payer: Self-pay | Admitting: Cardiovascular Disease

## 2017-02-14 VITALS — BP 134/67 | HR 70 | Ht 73.5 in | Wt 180.6 lb

## 2017-02-14 DIAGNOSIS — E785 Hyperlipidemia, unspecified: Secondary | ICD-10-CM | POA: Diagnosis not present

## 2017-02-14 DIAGNOSIS — I10 Essential (primary) hypertension: Secondary | ICD-10-CM

## 2017-02-14 DIAGNOSIS — I739 Peripheral vascular disease, unspecified: Secondary | ICD-10-CM | POA: Diagnosis not present

## 2017-02-14 NOTE — Patient Instructions (Signed)
NO CHANGES TO CURRENT TREATMENT OR MEDICATIONS     Your physician wants you to follow-up in [place]. You will receive a reminder letter in the mail two months in advance. If you don't receive a letter, please call our office to schedule the follow-up appointment.    If you need a refill on your cardiac medications before your next appointment, please call your pharmacy.

## 2017-02-14 NOTE — Progress Notes (Signed)
Cardiology Office Note   Date:  02/14/2017   ID:  Xavier [name], DOB 12-04-1940, MRN [id number]  PCP:  Crist Infante, MD  Cardiologist:  Dr. Fletcher Anon  Chief Complaint  Patient presents with  . Follow-up  . Leg Pain    cramping in legs occasionally.      History of Present Illness: Xavier [name] is a 76 y.o. male who Is here today for a follow-up visit regarding peripheral arterial disease. The patient has known history of diabetes, hyperlipidemia and hypertension. He quit smoking in the 80s.  The patient was diagnosed with peripheral arterial disease earlier this year with an abnormal ABI. Noninvasive vascular evaluation showed an ABI of 0.67 on the right and 0.91 on the left. Duplex showed moderate right SFA disease  The patient has minimal right calf claudication and has been treated medically. He reports no new symptoms. He is able to walk for a long distance without having any discomfort. He notices mild right calf discomfort with incline walking. He denies any chest pain or shortness of breath. No history of coronary artery disease. EKG last visit was normal.    Past Medical History:  Diagnosis Date  . Congenital ureterocele   . Diabetes mellitus without complication ([place])   . Hypertension   . Prostate cancer [facility])     Past Surgical History:  Procedure Laterality Date  . COLONOSCOPY W/ POLYPECTOMY    . EYE SURGERY       Current Outpatient Prescriptions  Medication Sig Dispense Refill  . aspirin EC 81 MG tablet Take 1 tablet (81 mg total) by mouth daily.    Marland Kitchen atorvastatin (LIPITOR) 80 MG tablet Take 80 mg by mouth daily.     . benazepril (LOTENSIN) 40 MG tablet Take 40 mg by mouth daily.    . carvedilol (COREG) 12.5 MG tablet Take 12.5 mg by mouth 2 (two) times daily with a meal.     . empagliflozin (JARDIANCE) 25 MG TABS tablet Take 25 mg by mouth every morning.    Marland Kitchen HUMULIN R U-500 KWIKPEN 500 UNIT/ML kwikpen Inject 10 Units into the skin daily with  breakfast.   11  . insulin regular human CONCENTRATED (HUMULIN R) 500 UNIT/ML injection Inject 30 Units into the skin daily with supper.    . triamterene-hydrochlorothiazide (MAXZIDE-25) 37.5-25 MG per tablet Take 1 tablet by mouth daily.     No current facility-administered medications for this visit.     Allergies:   Patient has no known allergies.    Social History:  The patient  reports that he has quit smoking. He has never used smokeless tobacco. He reports that he does not drink alcohol or use drugs.   Family History:  The patient's Family history: No premature coronary artery disease. Father died at the age of 23 during cardiac catheterization. Mother died of cerebral hemorrhage.  ROS:  Please see the history of present illness.   Otherwise, review of systems are positive for none.   All other systems are reviewed and negative.    PHYSICAL EXAM: VS:  BP 134/67   Pulse 70   Ht 6' 1.5" (1.867 m)   Wt 180 lb 9.6 oz (81.9 kg)   BMI 23.50 kg/m  , BMI Body mass index is 23.5 kg/m. GEN: Well nourished, well developed, in no acute distress  HEENT: normal  Neck: no JVD, carotid bruits, or masses Cardiac: RRR; no murmurs, rubs, or gallops,no edema  Respiratory:  clear to auscultation bilaterally,  normal work of breathing GI: soft, nontender, nondistended, + BS MS: no deformity or atrophy  Skin: warm and dry, no rash Neuro:  Strength and sensation are intact Psych: euthymic mood, full affect  EKG:  EKG is not ordered today.   Recent Labs: No results found for requested labs within last 8760 hours.    Lipid Panel No results found for: CHOL, TRIG, HDL, CHOLHDL, VLDL, LDLCALC, LDLDIRECT    Wt Readings from Last 3 Encounters:  02/14/17 180 lb 9.6 oz (81.9 kg)  02/08/17 183 lb (83 kg)  08/09/16 203 lb (92.1 kg)        No flowsheet data found.    ASSESSMENT AND PLAN:  1.  Peripheral arterial disease: .The patient has Stable minimal claudication which does not  seem to be lifestyle limiting. Thus, I recommend medical therapy.   2. Essential hypertension: Blood pressure is well controlled on current medications  3. Hyperlipidemia: Currently on high dose atorvastatin 80 mg once daily and followed by his primary care physician. She  Disposition:   FU with me in 12 months  Signed,  Kathlyn Sacramento, MD  02/14/2017 10:44 AM    [place]

## 2017-02-22 ENCOUNTER — Encounter: Payer: Self-pay | Admitting: Gastroenterology

## 2017-02-22 ENCOUNTER — Ambulatory Visit (AMBULATORY_SURGERY_CENTER): Payer: Medicare Other | Admitting: Gastroenterology

## 2017-02-22 ENCOUNTER — Telehealth: Payer: Self-pay | Admitting: Gastroenterology

## 2017-02-22 ENCOUNTER — Other Ambulatory Visit: Payer: Self-pay

## 2017-02-22 VITALS — BP 131/83 | HR 76 | Temp 99.3°F | Resp 12 | Ht 73.5 in | Wt 183.0 lb

## 2017-02-22 DIAGNOSIS — D124 Benign neoplasm of descending colon: Secondary | ICD-10-CM | POA: Diagnosis not present

## 2017-02-22 DIAGNOSIS — D122 Benign neoplasm of ascending colon: Secondary | ICD-10-CM

## 2017-02-22 DIAGNOSIS — Z86010 Personal history of colonic polyps: Secondary | ICD-10-CM

## 2017-02-22 DIAGNOSIS — D123 Benign neoplasm of transverse colon: Secondary | ICD-10-CM | POA: Diagnosis not present

## 2017-02-22 DIAGNOSIS — D128 Benign neoplasm of rectum: Secondary | ICD-10-CM

## 2017-02-22 DIAGNOSIS — K388 Other specified diseases of appendix: Secondary | ICD-10-CM

## 2017-02-22 MED ORDER — SODIUM CHLORIDE 0.9 % IV SOLN: 500.0000 mL | INTRAVENOUS | Status: DC

## 2017-02-22 NOTE — Telephone Encounter (Signed)
Pt scheduled for CT of A/P at [facility] CT 02/23/17@10 :15am, pt to arrive there at 10am. Pt to be NPO after 6am, drink bottle 1 of contrast at 8:15am, bottle 2 at 9:15am. Left message for pt to call back.

## 2017-02-22 NOTE — Progress Notes (Signed)
Oral contrast given in recovery room. Office will call and schedule CT scan.

## 2017-02-22 NOTE — Telephone Encounter (Signed)
Please make arrangements for this patient to have a CT scan of abdomen and pelvis with Oral Contrast Only for abnormal appendix on colonoscopy, rule out mucocele appendix. No IV contrast due to creatinine of 1.6 with his labs from PCP in May 2018. He was given the oral contrast in the [facility] today.

## 2017-02-22 NOTE — Progress Notes (Signed)
A/ox3 pleased with MAC, report to Celia RN 

## 2017-02-22 NOTE — Telephone Encounter (Signed)
Pt aware of appt but states he cannot make that appt. Pt given the phone number [phone] to reschedule the CT scan. Pt verbalized understanding.

## 2017-02-22 NOTE — Telephone Encounter (Signed)
Order in epic for oral contrast only CT of A/P. Left message for [facility] CT to call back.

## 2017-02-22 NOTE — Patient Instructions (Signed)
Discharge instructions given. Handouts on polyps and diverticulosis. Resume previous medications. No aspirin,ibuprofen,naproxen,or other non-steroidal anti-inflammatory drugs for 5 days. YOU HAD AN ENDOSCOPIC PROCEDURE TODAY AT THE  ENDOSCOPY CENTER:   Refer to the procedure report that was given to you for any specific questions about what was found during the examination.  If the procedure report does not answer your questions, please call your gastroenterologist to clarify.  If you requested that your care partner not be given the details of your procedure findings, then the procedure report has been included in a sealed envelope for you to review at your convenience later.  YOU SHOULD EXPECT: Some feelings of bloating in the abdomen. Passage of more gas than usual.  Walking can help get rid of the air that was put into your GI tract during the procedure and reduce the bloating. If you had a lower endoscopy (such as a colonoscopy or flexible sigmoidoscopy) you may notice spotting of blood in your stool or on the toilet paper. If you underwent a bowel prep for your procedure, you may not have a normal bowel movement for a few days.  Please Note:  You might notice some irritation and congestion in your nose or some drainage.  This is from the oxygen used during your procedure.  There is no need for concern and it should clear up in a day or so.  SYMPTOMS TO REPORT IMMEDIATELY:   Following lower endoscopy (colonoscopy or flexible sigmoidoscopy):  Excessive amounts of blood in the stool  Significant tenderness or worsening of abdominal pains  Swelling of the abdomen that is new, acute  Fever of 100F or higher   For urgent or emergent issues, a gastroenterologist can be reached at any hour by calling [phone].   DIET:  We do recommend a small meal at first, but then you may proceed to your regular diet.  Drink plenty of fluids but you should avoid alcoholic beverages for 24  hours.  ACTIVITY:  You should plan to take it easy for the rest of today and you should NOT DRIVE or use heavy machinery until tomorrow (because of the sedation medicines used during the test).    FOLLOW UP: Our staff will call the number listed on your records the next business day following your procedure to check on you and address any questions or concerns that you may have regarding the information given to you following your procedure. If we do not reach you, we will leave a message.  However, if you are feeling well and you are not experiencing any problems, there is no need to return our call.  We will assume that you have returned to your regular daily activities without incident.  If any biopsies were taken you will be contacted by phone or by letter within the next 1-3 weeks.  Please call us at [phone] if you have not heard about the biopsies in 3 weeks.    SIGNATURES/CONFIDENTIALITY: You and/or your care partner have signed paperwork which will be entered into your electronic medical record.  These signatures attest to the fact that that the information above on your After Visit Summary has been reviewed and is understood.  Full responsibility of the confidentiality of this discharge information lies with you and/or your care-partner. 

## 2017-02-22 NOTE — Progress Notes (Signed)
Called to room to assist during endoscopic procedure.  Patient ID and intended procedure confirmed with present staff. Received instructions for my participation in the procedure from the performing physician.  

## 2017-02-22 NOTE — Op Note (Signed)
[place] Patient Name: Xavier [name] Procedure Date: 02/22/2017 10:20 AM MRN: [id number] Endoscopist: Mallie Mussel L. Loletha Carrow , MD Age: 76 Referring MD:  Date of Birth: April 14, 1941 Gender: Male Account #: [id number] Procedure:                Colonoscopy Indications:              Surveillance: Personal history of adenomatous                            polyps on last colonoscopy > 5 years ago (71mm TVA                            02/2009) Medicines:                Monitored Anesthesia Care Procedure:                Pre-Anesthesia Assessment:                           - Prior to the procedure, a History and Physical                            was performed, and patient medications and                            allergies were reviewed. The patient's tolerance of                            previous anesthesia was also reviewed. The risks                            and benefits of the procedure and the sedation                            options and risks were discussed with the patient.                            All questions were answered, and informed consent                            was obtained. Anticoagulants: The patient has taken                            aspirin. It was decided not to withhold this                            medication prior to the procedure. ASA Grade                            Assessment: II - A patient with mild systemic                            disease. After reviewing the risks and benefits,  the patient was deemed in satisfactory condition to                            undergo the procedure.                           After obtaining informed consent, the colonoscope                            was passed under direct vision. Throughout the                            procedure, the patient's blood pressure, pulse, and                            oxygen saturations were monitored continuously. The   Colonoscope was introduced through the anus and                            advanced to the the cecum, identified by                            appendiceal orifice and ileocecal valve. The                            colonoscopy was performed without difficulty. The                            patient tolerated the procedure well. The quality                            of the bowel preparation was good. The ileocecal                            valve, appendiceal orifice, and rectum were                            photographed. The quality of the bowel preparation                            was evaluated using the BBPS [facility] Bowel                            Preparation Scale) with scores of: Right Colon = 2,                            Transverse Colon = 3 and Left Colon = 2. The total                            BBPS score equals 7. The bowel preparation used was                            SUPREP. Scope In: 10:26:26 AM Scope Out: 10:48:35 AM Scope Withdrawal Time: 0  hours 17 minutes 2 seconds  Total Procedure Duration: 0 hours 22 minutes 9 seconds  Findings:                 The perianal and digital rectal examinations were                            normal.                           Many large-mouthed diverticula were found in the                            entire colon.                           There was a bulge in the region of the appendiceal                            orifice. The overlying and surrounding mucosa was                            normal.                           Six sessile polyps were found in the rectum,                            descending colon, transverse colon and ascending                            colon. The polyps were 2 to 6 mm in size. These                            polyps were removed with a cold snare. Resection                            and retrieval were complete.                           The exam was otherwise without abnormality on                             direct and retroflexion views. Complications:            No immediate complications. Estimated Blood Loss:     Estimated blood loss was minimal. Impression:               - Diverticulosis in the entire examined colon.                           - Six 2 to 6 mm polyps in the rectum, in the                            descending colon, in the transverse colon and in  the ascending colon, removed with a cold snare.                            Resected and retrieved.                           - The examination was otherwise normal on direct                            and retroflexion views.                           Abnormal appearance of the cecal base, raising                            suspicion for a mucoele appendix. Recommendation:           - Patient has a contact number available for                            emergencies. The signs and symptoms of potential                            delayed complications were discussed with the                            patient. Return to normal activities tomorrow.                            Written discharge instructions were provided to the                            patient.                           - Resume previous diet.                           - No aspirin, ibuprofen, naproxen, or other                            non-steroidal anti-inflammatory drugs for 5 days                            after polyp removal.                           - Await pathology results.                           - Repeat colonoscopy is recommended for                            surveillance. The colonoscopy date will be                            determined after pathology results  from today's                            exam become available for review.                           - Perform CT scan (computed tomography) of the                            abdomen and pelvis with oral contrast only (most                             recent creatinine 1.6) at the next available                            appointment. Amen Staszak L. Loletha Carrow, MD 02/22/2017 11:04:06 AM This report has been signed electronically.

## 2017-02-23 ENCOUNTER — Inpatient Hospital Stay: Admission: RE | Admit: 2017-02-23 | Payer: Medicare Other | Source: Ambulatory Visit

## 2017-02-23 ENCOUNTER — Telehealth: Payer: Self-pay

## 2017-02-23 NOTE — Telephone Encounter (Signed)
Attempted to reach patient for post-procedure f/u call at both home and mobile phone numbers, no answer. Left message that we will attempt to reach him again later today and for him to please call us if he has any questions/concerns regarding his care.

## 2017-02-23 NOTE — Telephone Encounter (Signed)
  Follow up Call-  Call back number 02/22/2017  Post procedure Call Back phone  # [phone]  Permission to leave phone message Yes  Some recent data might be hidden     Patient questions:  Do you have a fever, pain , or abdominal swelling? No. Pain Score  0 *  Have you tolerated food without any problems? Yes.    Have you been able to return to your normal activities? Yes.    Do you have any questions about your discharge instructions: Diet   No. Medications  No. Follow up visit  No.  Do you have questions or concerns about your Care? Yes.   Patient is scheduled for a CT scan tomorrow at the [facility]. Patient has concerns that he may be claustrophobic. He states that he has had a CT scan in the past was fine because of the way the machine was set up. I advised patient to call the imaging center and speak with them to determine whether or not he felt he would feel OK in their equipment. Patient advised to call us back if he feels it will be a problem and we will come up with a solution for him. Patient verbalized understanding.  Actions: * If pain score is 4 or above: No action needed, pain <4.

## 2017-02-24 ENCOUNTER — Ambulatory Visit (INDEPENDENT_AMBULATORY_CARE_PROVIDER_SITE_OTHER)
Admission: RE | Admit: 2017-02-24 | Discharge: 2017-02-24 | Disposition: A | Discharge status: Home / Self Care | Payer: Medicare Other | Source: Ambulatory Visit | Attending: Gastroenterology | Admitting: Gastroenterology

## 2017-02-24 DIAGNOSIS — K388 Other specified diseases of appendix: Secondary | ICD-10-CM | POA: Diagnosis not present

## 2017-03-01 ENCOUNTER — Encounter: Payer: Self-pay | Admitting: Gastroenterology

## 2017-04-20 DIAGNOSIS — Z794 Long term (current) use of insulin: Secondary | ICD-10-CM | POA: Insufficient documentation

## 2017-07-17 ENCOUNTER — Ambulatory Visit: Payer: Medicare Other | Admitting: Podiatry

## 2017-07-17 ENCOUNTER — Ambulatory Visit (INDEPENDENT_AMBULATORY_CARE_PROVIDER_SITE_OTHER): Payer: Medicare Other

## 2017-07-17 ENCOUNTER — Encounter: Payer: Self-pay | Admitting: Podiatry

## 2017-07-17 DIAGNOSIS — S93602A Unspecified sprain of left foot, initial encounter: Secondary | ICD-10-CM

## 2017-07-17 DIAGNOSIS — M779 Enthesopathy, unspecified: Secondary | ICD-10-CM

## 2017-07-17 MED ORDER — TRIAMCINOLONE ACETONIDE 10 MG/ML IJ SUSP
10.0000 mg | Freq: Once | INTRAMUSCULAR | Status: AC
  Administered 2017-07-17: 10 mg

## 2017-07-19 NOTE — Progress Notes (Signed)
Subjective:   Patient ID: Xavier [name], male   DOB: 77 y.o.   MRN: [id number]   HPI Patient presents stating he developed a significant pain underneath his first metatarsal head on his left foot and it made it hard for him to walk comfortably and he does not remember specific injury and states is been present for the last several weeks   ROS      Objective:  Physical Exam  Neurovascular status intact with patient found to have inflammation of the first metatarsal lateral side of the joint and slightly proximal with mild discomfort when I move the big toe     Assessment:  Appears to be an inflammatory condition with plantar pain which may be traumatic or may be due to gait process     Plan:  H&P condition reviewed and at this time I went ahead and injected with 3 mg Kenalog 5 mg Xylocaine to reduce the inflammation and patient will be seen back if symptoms were to indicate  X-ray indicates that there does not appear to be any bony pathology or any indications of advanced arthritis

## 2017-12-09 ENCOUNTER — Encounter (HOSPITAL_COMMUNITY): Payer: Self-pay | Admitting: Emergency Medicine

## 2017-12-09 ENCOUNTER — Observation Stay (HOSPITAL_COMMUNITY)
Admission: EM | Admit: 2017-12-09 | Discharge: 2017-12-10 | Disposition: A | Discharge status: Home / Self Care | Payer: Medicare Other | Attending: Family Medicine | Admitting: Family Medicine

## 2017-12-09 ENCOUNTER — Emergency Department (HOSPITAL_COMMUNITY): Payer: Medicare Other

## 2017-12-09 ENCOUNTER — Other Ambulatory Visit: Payer: Self-pay

## 2017-12-09 DIAGNOSIS — I2510 Atherosclerotic heart disease of native coronary artery without angina pectoris: Secondary | ICD-10-CM | POA: Insufficient documentation

## 2017-12-09 DIAGNOSIS — I5030 Unspecified diastolic (congestive) heart failure: Secondary | ICD-10-CM | POA: Diagnosis not present

## 2017-12-09 DIAGNOSIS — I700 Atherosclerosis of aorta: Secondary | ICD-10-CM | POA: Insufficient documentation

## 2017-12-09 DIAGNOSIS — R55 Syncope and collapse: Secondary | ICD-10-CM | POA: Diagnosis not present

## 2017-12-09 DIAGNOSIS — J439 Emphysema, unspecified: Secondary | ICD-10-CM | POA: Diagnosis not present

## 2017-12-09 DIAGNOSIS — M50221 Other cervical disc displacement at C4-C5 level: Secondary | ICD-10-CM | POA: Diagnosis not present

## 2017-12-09 DIAGNOSIS — Z9889 Other specified postprocedural states: Secondary | ICD-10-CM | POA: Insufficient documentation

## 2017-12-09 DIAGNOSIS — K5730 Diverticulosis of large intestine without perforation or abscess without bleeding: Secondary | ICD-10-CM | POA: Diagnosis not present

## 2017-12-09 DIAGNOSIS — Z7982 Long term (current) use of aspirin: Secondary | ICD-10-CM | POA: Diagnosis not present

## 2017-12-09 DIAGNOSIS — I080 Rheumatic disorders of both mitral and aortic valves: Secondary | ICD-10-CM | POA: Insufficient documentation

## 2017-12-09 DIAGNOSIS — Z8546 Personal history of malignant neoplasm of prostate: Secondary | ICD-10-CM | POA: Insufficient documentation

## 2017-12-09 DIAGNOSIS — N179 Acute kidney failure, unspecified: Secondary | ICD-10-CM | POA: Diagnosis present

## 2017-12-09 DIAGNOSIS — Z794 Long term (current) use of insulin: Secondary | ICD-10-CM | POA: Diagnosis not present

## 2017-12-09 DIAGNOSIS — E11649 Type 2 diabetes mellitus with hypoglycemia without coma: Secondary | ICD-10-CM | POA: Insufficient documentation

## 2017-12-09 DIAGNOSIS — N400 Enlarged prostate without lower urinary tract symptoms: Secondary | ICD-10-CM | POA: Insufficient documentation

## 2017-12-09 DIAGNOSIS — I10 Essential (primary) hypertension: Secondary | ICD-10-CM | POA: Diagnosis present

## 2017-12-09 DIAGNOSIS — J320 Chronic maxillary sinusitis: Secondary | ICD-10-CM | POA: Insufficient documentation

## 2017-12-09 DIAGNOSIS — E785 Hyperlipidemia, unspecified: Secondary | ICD-10-CM | POA: Diagnosis present

## 2017-12-09 DIAGNOSIS — S0003XA Contusion of scalp, initial encounter: Secondary | ICD-10-CM | POA: Diagnosis not present

## 2017-12-09 DIAGNOSIS — M4802 Spinal stenosis, cervical region: Secondary | ICD-10-CM | POA: Diagnosis not present

## 2017-12-09 DIAGNOSIS — S20212A Contusion of left front wall of thorax, initial encounter: Secondary | ICD-10-CM | POA: Insufficient documentation

## 2017-12-09 DIAGNOSIS — I6523 Occlusion and stenosis of bilateral carotid arteries: Secondary | ICD-10-CM | POA: Diagnosis not present

## 2017-12-09 DIAGNOSIS — E1122 Type 2 diabetes mellitus with diabetic chronic kidney disease: Secondary | ICD-10-CM | POA: Diagnosis not present

## 2017-12-09 DIAGNOSIS — I739 Peripheral vascular disease, unspecified: Secondary | ICD-10-CM

## 2017-12-09 DIAGNOSIS — N183 Chronic kidney disease, stage 3 unspecified: Secondary | ICD-10-CM | POA: Diagnosis present

## 2017-12-09 DIAGNOSIS — I129 Hypertensive chronic kidney disease with stage 1 through stage 4 chronic kidney disease, or unspecified chronic kidney disease: Secondary | ICD-10-CM | POA: Insufficient documentation

## 2017-12-09 DIAGNOSIS — M5030 Other cervical disc degeneration, unspecified cervical region: Secondary | ICD-10-CM | POA: Insufficient documentation

## 2017-12-09 DIAGNOSIS — Z79899 Other long term (current) drug therapy: Secondary | ICD-10-CM | POA: Diagnosis not present

## 2017-12-09 DIAGNOSIS — E1129 Type 2 diabetes mellitus with other diabetic kidney complication: Secondary | ICD-10-CM | POA: Diagnosis present

## 2017-12-09 DIAGNOSIS — I160 Hypertensive urgency: Secondary | ICD-10-CM | POA: Diagnosis present

## 2017-12-09 DIAGNOSIS — Z87891 Personal history of nicotine dependence: Secondary | ICD-10-CM | POA: Insufficient documentation

## 2017-12-09 DIAGNOSIS — Z8673 Personal history of transient ischemic attack (TIA), and cerebral infarction without residual deficits: Secondary | ICD-10-CM | POA: Insufficient documentation

## 2017-12-09 LAB — COMPREHENSIVE METABOLIC PANEL
ALBUMIN: 4.5 g/dL (ref 3.5–5.0)
ALT: 27 U/L (ref 17–63)
ANION GAP: 15 (ref 5–15)
AST: 39 U/L (ref 15–41)
Alkaline Phosphatase: 98 U/L (ref 38–126)
BUN: 25 mg/dL — ABNORMAL HIGH (ref 6–20)
CO2: 26 mmol/L (ref 22–32)
Calcium: 10.8 mg/dL — ABNORMAL HIGH (ref 8.9–10.3)
Chloride: 100 mmol/L — ABNORMAL LOW (ref 101–111)
Creatinine, Ser: 2 mg/dL — ABNORMAL HIGH (ref 0.61–1.24)
GFR calc non Af Amer: 30 mL/min — ABNORMAL LOW (ref >60)
GFR, EST AFRICAN AMERICAN: 35 mL/min — AB (ref >60)
GLUCOSE: 79 mg/dL (ref 65–99)
POTASSIUM: 4.3 mmol/L (ref 3.5–5.1)
SODIUM: 141 mmol/L (ref 135–145)
TOTAL PROTEIN: 7.8 g/dL (ref 6.5–8.1)
Total Bilirubin: 1.3 mg/dL — ABNORMAL HIGH (ref 0.3–1.2)

## 2017-12-09 LAB — CBC
HEMATOCRIT: 47.3 % (ref 39.0–52.0)
HEMOGLOBIN: 15.1 g/dL (ref 13.0–17.0)
MCH: 26.6 pg (ref 26.0–34.0)
MCHC: 31.9 g/dL (ref 30.0–36.0)
MCV: 83.4 fL (ref 78.0–100.0)
Platelets: 261 10*3/uL (ref 150–400)
RBC: 5.67 MIL/uL (ref 4.22–5.81)
RDW: 12.2 % (ref 11.5–15.5)
WBC: 7.2 10*3/uL (ref 4.0–10.5)

## 2017-12-09 LAB — SAMPLE TO BLOOD BANK

## 2017-12-09 LAB — CBG MONITORING, ED
GLUCOSE-CAPILLARY: 79 mg/dL (ref 65–99)
GLUCOSE-CAPILLARY: 218 mg/dL — AB (ref 65–99)

## 2017-12-09 LAB — URINALYSIS, ROUTINE W REFLEX MICROSCOPIC
BILIRUBIN URINE: NEGATIVE
Bacteria, UA: NONE SEEN
HGB URINE DIPSTICK: NEGATIVE
KETONES UR: NEGATIVE mg/dL
LEUKOCYTES UA: NEGATIVE
NITRITE: NEGATIVE
PROTEIN: NEGATIVE mg/dL
Specific Gravity, Urine: 1.02 (ref 1.005–1.030)
pH: 7 (ref 5.0–8.0)

## 2017-12-09 LAB — TROPONIN I: Troponin I: <0.03 ng/mL (ref <0.03)

## 2017-12-09 LAB — CDS SEROLOGY

## 2017-12-09 LAB — GLUCOSE, CAPILLARY: Glucose-Capillary: 183 mg/dL — ABNORMAL HIGH (ref 65–99)

## 2017-12-09 LAB — PROTIME-INR
INR: 0.97
Prothrombin Time: 12.8 seconds (ref 11.4–15.2)

## 2017-12-09 LAB — ETHANOL: Alcohol, Ethyl (B): <10 mg/dL (ref <10)

## 2017-12-09 LAB — CREATININE, URINE, RANDOM: CREATININE, URINE: 96.04 mg/dL

## 2017-12-09 MED ORDER — INSULIN ASPART 100 UNIT/ML ~~LOC~~ SOLN
0.0000 [IU] | Freq: Every day | SUBCUTANEOUS | Status: DC
  Administered 2017-12-09: 2 [IU] via SUBCUTANEOUS
  Filled 2017-12-09: qty 1

## 2017-12-09 MED ORDER — ACETAMINOPHEN 650 MG RE SUPP: 650.0000 mg | Freq: Four times a day (QID) | RECTAL | Status: DC | PRN

## 2017-12-09 MED ORDER — ZOLPIDEM TARTRATE 5 MG PO TABS: 5.0000 mg | ORAL_TABLET | Freq: Every evening | ORAL | Status: DC | PRN

## 2017-12-09 MED ORDER — ONDANSETRON HCL 4 MG/2ML IJ SOLN: 4.0000 mg | Freq: Four times a day (QID) | INTRAMUSCULAR | Status: DC | PRN

## 2017-12-09 MED ORDER — ENOXAPARIN SODIUM 40 MG/0.4ML ~~LOC~~ SOLN: 40.0000 mg | SUBCUTANEOUS | Status: DC

## 2017-12-09 MED ORDER — SODIUM CHLORIDE 0.9% FLUSH: 3.0000 mL | Freq: Two times a day (BID) | INTRAVENOUS | Status: DC

## 2017-12-09 MED ORDER — SODIUM CHLORIDE 0.9 % IV SOLN
INTRAVENOUS | Status: DC
  Administered 2017-12-09 – 2017-12-10 (×2): via INTRAVENOUS

## 2017-12-09 MED ORDER — ACETAMINOPHEN 325 MG PO TABS
650.0000 mg | ORAL_TABLET | Freq: Four times a day (QID) | ORAL | Status: DC | PRN
  Administered 2017-12-09: 650 mg via ORAL
  Filled 2017-12-09: qty 2

## 2017-12-09 MED ORDER — CARVEDILOL 12.5 MG PO TABS
12.5000 mg | ORAL_TABLET | Freq: Two times a day (BID) | ORAL | Status: DC
  Administered 2017-12-09 – 2017-12-10 (×2): 12.5 mg via ORAL
  Filled 2017-12-09 (×2): qty 1

## 2017-12-09 MED ORDER — HYDRALAZINE HCL 20 MG/ML IJ SOLN: 5.0000 mg | INTRAMUSCULAR | Status: DC | PRN

## 2017-12-09 MED ORDER — HYDRALAZINE HCL 20 MG/ML IJ SOLN
10.0000 mg | Freq: Once | INTRAMUSCULAR | Status: AC
  Administered 2017-12-09: 10 mg via INTRAVENOUS
  Filled 2017-12-09: qty 1

## 2017-12-09 MED ORDER — AMLODIPINE BESYLATE 10 MG PO TABS
10.0000 mg | ORAL_TABLET | Freq: Every day | ORAL | Status: DC
  Administered 2017-12-10 (×2): 10 mg via ORAL
  Filled 2017-12-09 (×2): qty 1

## 2017-12-09 MED ORDER — ASPIRIN EC 81 MG PO TBEC
81.0000 mg | DELAYED_RELEASE_TABLET | Freq: Every day | ORAL | Status: DC
  Administered 2017-12-09 – 2017-12-10 (×2): 81 mg via ORAL
  Filled 2017-12-09 (×2): qty 1

## 2017-12-09 MED ORDER — SENNOSIDES-DOCUSATE SODIUM 8.6-50 MG PO TABS: 1.0000 | ORAL_TABLET | Freq: Every evening | ORAL | Status: DC | PRN

## 2017-12-09 MED ORDER — SODIUM CHLORIDE 0.9 % IV BOLUS
500.0000 mL | Freq: Once | INTRAVENOUS | Status: AC
  Administered 2017-12-09: 500 mL via INTRAVENOUS

## 2017-12-09 MED ORDER — FENTANYL CITRATE (PF) 100 MCG/2ML IJ SOLN: 50.0000 ug | Freq: Once | INTRAMUSCULAR | Status: DC

## 2017-12-09 MED ORDER — INSULIN ASPART 100 UNIT/ML ~~LOC~~ SOLN
0.0000 [IU] | Freq: Three times a day (TID) | SUBCUTANEOUS | Status: DC
  Administered 2017-12-10 (×2): 3 [IU] via SUBCUTANEOUS

## 2017-12-09 MED ORDER — ATORVASTATIN CALCIUM 80 MG PO TABS
80.0000 mg | ORAL_TABLET | Freq: Every day | ORAL | Status: DC
  Administered 2017-12-09 – 2017-12-10 (×2): 80 mg via ORAL
  Filled 2017-12-09 (×2): qty 1

## 2017-12-09 MED ORDER — ONDANSETRON HCL 4 MG PO TABS: 4.0000 mg | ORAL_TABLET | Freq: Four times a day (QID) | ORAL | Status: DC | PRN
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
## 2017-12-09 NOTE — ED Notes (Signed)
Pt and family made aware that scans have not yet resulted though family insist on feeding patient.
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
## 2017-12-09 NOTE — H&P (Addendum)
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 7PM-7AM, please contact night-coverage [email or web address] Password [facility] 12/09/2017, 9:57 PM

## 2017-12-09 NOTE — ED Notes (Signed)
Reminded pt and family again, pt is NPO until scans have been taken and resulted.

## 2017-12-09 NOTE — ED Notes (Signed)
Pt and family aware pt in NPO until CT scans have been taken and resulted.

## 2017-12-09 NOTE — ED Notes (Signed)
Attempted report X1

## 2017-12-10 ENCOUNTER — Observation Stay (HOSPITAL_BASED_OUTPATIENT_CLINIC_OR_DEPARTMENT_OTHER): Payer: Medicare Other

## 2017-12-10 ENCOUNTER — Observation Stay (HOSPITAL_COMMUNITY): Payer: Medicare Other

## 2017-12-10 ENCOUNTER — Other Ambulatory Visit: Payer: Self-pay

## 2017-12-10 DIAGNOSIS — I5030 Unspecified diastolic (congestive) heart failure: Secondary | ICD-10-CM | POA: Diagnosis not present

## 2017-12-10 DIAGNOSIS — R55 Syncope and collapse: Secondary | ICD-10-CM | POA: Diagnosis not present

## 2017-12-10 LAB — BASIC METABOLIC PANEL
ANION GAP: 10 (ref 5–15)
BUN: 26 mg/dL — ABNORMAL HIGH (ref 6–20)
CALCIUM: 9.7 mg/dL (ref 8.9–10.3)
CO2: 29 mmol/L (ref 22–32)
Chloride: 101 mmol/L (ref 101–111)
Creatinine, Ser: 1.76 mg/dL — ABNORMAL HIGH (ref 0.61–1.24)
GFR, EST AFRICAN AMERICAN: 41 mL/min — AB (ref >60)
GFR, EST NON AFRICAN AMERICAN: 36 mL/min — AB (ref >60)
Glucose, Bld: 199 mg/dL — ABNORMAL HIGH (ref 65–99)
Potassium: 4.2 mmol/L (ref 3.5–5.1)
Sodium: 140 mmol/L (ref 135–145)

## 2017-12-10 LAB — CBC
HCT: 45.2 % (ref 39.0–52.0)
HEMOGLOBIN: 14.2 g/dL (ref 13.0–17.0)
MCH: 26.5 pg (ref 26.0–34.0)
MCHC: 31.4 g/dL (ref 30.0–36.0)
MCV: 84.5 fL (ref 78.0–100.0)
Platelets: 217 10*3/uL (ref 150–400)
RBC: 5.35 MIL/uL (ref 4.22–5.81)
RDW: 12.3 % (ref 11.5–15.5)
WBC: 9.6 10*3/uL (ref 4.0–10.5)

## 2017-12-10 LAB — HEMOGLOBIN A1C
Hgb A1c MFr Bld: 10.6 % — ABNORMAL HIGH (ref 4.8–5.6)
Mean Plasma Glucose: 257.52 mg/dL

## 2017-12-10 LAB — ECHOCARDIOGRAM COMPLETE
HEIGHTINCHES: 74 in
Weight: 2860.8 oz

## 2017-12-10 LAB — RAPID URINE DRUG SCREEN, HOSP PERFORMED
AMPHETAMINES: NOT DETECTED
BENZODIAZEPINES: NOT DETECTED
Barbiturates: NOT DETECTED
COCAINE: NOT DETECTED
OPIATES: NOT DETECTED
TETRAHYDROCANNABINOL: NOT DETECTED

## 2017-12-10 LAB — GLUCOSE, CAPILLARY
Glucose-Capillary: 214 mg/dL — ABNORMAL HIGH (ref 65–99)
Glucose-Capillary: 216 mg/dL — ABNORMAL HIGH (ref 65–99)

## 2017-12-10 MED ORDER — AMLODIPINE BESYLATE 10 MG PO TABS: 10.0000 mg | ORAL_TABLET | Freq: Every day | ORAL | 3 refills | Status: DC

## 2017-12-10 MED ORDER — ASPIRIN EC 81 MG PO TBEC: 81.0000 mg | DELAYED_RELEASE_TABLET | Freq: Every day | ORAL | 5 refills | Status: DC

## 2017-12-10 MED ORDER — ATORVASTATIN CALCIUM 80 MG PO TABS: 80.0000 mg | ORAL_TABLET | Freq: Every day | ORAL | 5 refills | Status: DC

## 2017-12-10 MED ORDER — ONDANSETRON HCL 4 MG PO TABS: 4.0000 mg | ORAL_TABLET | Freq: Four times a day (QID) | ORAL | 0 refills | Status: DC | PRN

## 2017-12-10 MED ORDER — TRIAMTERENE-HCTZ 37.5-25 MG PO TABS: 0.5000 | ORAL_TABLET | Freq: Every day | ORAL | 3 refills | Status: DC

## 2017-12-10 MED ORDER — SENNOSIDES-DOCUSATE SODIUM 8.6-50 MG PO TABS: 2.0000 | ORAL_TABLET | Freq: Every day | ORAL | 2 refills | Status: DC

## 2017-12-10 NOTE — Progress Notes (Signed)
OT Cancellation Note  Patient Details Name: Xavier [name] MRN: [id number] DOB: [birth year]   Cancelled Treatment:    Reason Eval/Treat Not Completed: OT screened, no needs identified, will sign off  Jaci Carrel 12/10/2017, 3:53 PM  Hulda Humphrey OTR/L [phone]

## 2017-12-10 NOTE — Progress Notes (Signed)
OT Cancellation Note  Patient Details Name: Xavier [name] MRN: [id number] DOB: [birth year]   Cancelled Treatment:    Reason Eval/Treat Not Completed: Patient at procedure or test/ unavailable(Vascular Lab). OT will continue to follow for evaluation.  [place] 12/10/2017, 8:46 AM  Hulda Humphrey OTR/L [phone]

## 2017-12-10 NOTE — Progress Notes (Signed)
  Echocardiogram 2D Echocardiogram has been performed.  Jennette Dubin 12/10/2017, 10:30 AM
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
## 2017-12-10 NOTE — Progress Notes (Signed)
*  PRELIMINARY RESULTS* Vascular Ultrasound Carotid Duplex (Doppler) has been completed.  Preliminary findings: Bilateral 1-39% ICA stenosis, antegrade vertebral flow.  Everrett Coombe 12/10/2017, 9:50 AM
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
## 2017-12-10 NOTE — Progress Notes (Signed)
Forgot to insert new column. Time is 4:22 am on 2019Jun02. Xavier [name]

## 2017-12-10 NOTE — Progress Notes (Signed)
Pt and wife given avs instructions and written rx, all questions entertained and answered, dc home via wheelchair in stable condition

## 2017-12-11 LAB — PARATHYROID HORMONE, INTACT (NO CA): PTH: 16 pg/mL (ref 15–65)

## 2017-12-11 LAB — VITAMIN D 25 HYDROXY (VIT D DEFICIENCY, FRACTURES): Vit D, 25-Hydroxy: 13.4 ng/mL — ABNORMAL LOW (ref 30.0–100.0)

## 2017-12-11 LAB — UREA NITROGEN, URINE: UREA NITROGEN UR: 531 mg/dL

## 2017-12-12 LAB — CALCITRIOL (1,25 DI-OH VIT D): VIT D 1 25 DIHYDROXY: 22.5 pg/mL (ref 19.9–79.3)

## 2017-12-14 DIAGNOSIS — R918 Other nonspecific abnormal finding of lung field: Secondary | ICD-10-CM | POA: Insufficient documentation

## 2017-12-14 DIAGNOSIS — I639 Cerebral infarction, unspecified: Secondary | ICD-10-CM | POA: Insufficient documentation

## 2017-12-28 ENCOUNTER — Other Ambulatory Visit: Payer: Self-pay | Admitting: Nurse Practitioner

## 2018-01-01 ENCOUNTER — Ambulatory Visit (INDEPENDENT_AMBULATORY_CARE_PROVIDER_SITE_OTHER): Payer: Medicare Other

## 2018-01-01 ENCOUNTER — Other Ambulatory Visit: Payer: Self-pay | Admitting: Internal Medicine

## 2018-01-01 DIAGNOSIS — R55 Syncope and collapse: Secondary | ICD-10-CM

## 2018-01-22 ENCOUNTER — Other Ambulatory Visit: Payer: Self-pay | Admitting: Nephrology

## 2018-01-22 DIAGNOSIS — N183 Chronic kidney disease, stage 3 unspecified: Secondary | ICD-10-CM

## 2018-02-02 ENCOUNTER — Ambulatory Visit
Admission: RE | Admit: 2018-02-02 | Discharge: 2018-02-02 | Disposition: A | Discharge status: Home / Self Care | Payer: Medicare Other | Source: Ambulatory Visit | Attending: Nephrology | Admitting: Nephrology

## 2018-02-02 DIAGNOSIS — N183 Chronic kidney disease, stage 3 unspecified: Secondary | ICD-10-CM

## 2018-03-20 ENCOUNTER — Ambulatory Visit: Payer: Medicare Other | Admitting: Cardiovascular Disease

## 2018-03-20 ENCOUNTER — Encounter: Payer: Self-pay | Admitting: Cardiovascular Disease

## 2018-03-20 VITALS — BP 154/74 | HR 59 | Ht 74.0 in | Wt 186.6 lb

## 2018-03-20 DIAGNOSIS — R0609 Other forms of dyspnea: Secondary | ICD-10-CM

## 2018-03-20 DIAGNOSIS — R9431 Abnormal electrocardiogram [ECG] [EKG]: Secondary | ICD-10-CM

## 2018-03-20 DIAGNOSIS — E785 Hyperlipidemia, unspecified: Secondary | ICD-10-CM | POA: Diagnosis not present

## 2018-03-20 DIAGNOSIS — I739 Peripheral vascular disease, unspecified: Secondary | ICD-10-CM | POA: Diagnosis not present
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
## 2018-03-20 NOTE — Patient Instructions (Signed)
Medication Instructions:  Your physician recommends that you continue on your current medications as directed. Please refer to the Current Medication list given to you today.   Labwork: None ordered  Testing/Procedures: Your physician has requested that you have en exercise stress myoview. For further information please visit [email or web address]. Please follow instruction sheet, as given.   Follow-Up: Your physician wants you to follow-up in: 1 year with Dr.Arida You will receive a reminder letter in the mail two months in advance. If you don't receive a letter, please call our office to schedule the follow-up appointment.   Any Other Special Instructions Will Be Listed Below (If Applicable).     If you need a refill on your cardiac medications before your next appointment, please call your pharmacy.

## 2018-03-30 ENCOUNTER — Telehealth: Payer: Self-pay | Admitting: *Deleted

## 2018-03-30 NOTE — Telephone Encounter (Signed)
Left a message for the patient to call back. The patient will need to hold his Carvedilol 24 hours prior to his exercise myoview scheduled on 9/26.

## 2018-04-03 ENCOUNTER — Telehealth (HOSPITAL_COMMUNITY): Payer: Self-pay

## 2018-04-03 NOTE — Telephone Encounter (Signed)
Encounter complete. 

## 2018-04-04 NOTE — Telephone Encounter (Signed)
Left a message for the patient stating that he will need to hold his carvedilol prior to his treadmill myoview tomorrow. He should call back if he has any questions.

## 2018-04-05 ENCOUNTER — Ambulatory Visit (HOSPITAL_COMMUNITY)
Admission: RE | Admit: 2018-04-05 | Discharge: 2018-04-05 | Disposition: A | Discharge status: Home / Self Care | Payer: Medicare Other | Source: Ambulatory Visit | Attending: Cardiology | Admitting: Cardiology

## 2018-04-05 DIAGNOSIS — R9431 Abnormal electrocardiogram [ECG] [EKG]: Secondary | ICD-10-CM | POA: Insufficient documentation

## 2018-04-05 DIAGNOSIS — R0609 Other forms of dyspnea: Secondary | ICD-10-CM | POA: Diagnosis present

## 2018-04-05 LAB — MYOCARDIAL PERFUSION IMAGING
CHL CUP NUCLEAR SDS: 0
CHL CUP NUCLEAR SRS: 1
CSEPED: 7 min
CSEPHR: 90 %
CSEPPHR: 130 {beats}/min
Estimated workload: 7.5 METS
Exercise duration (sec): 21 s
LV sys vol: 53 mL
LVDIAVOL: 100 mL (ref 62–150)
MPHR: 143 {beats}/min
NUC STRESS TID: 1.11
RPE: 19
Rest HR: 61 {beats}/min
SSS: 1

## 2018-04-05 MED ORDER — TECHNETIUM TC 99M TETROFOSMIN IV KIT
9.9000 | PACK | Freq: Once | INTRAVENOUS | Status: AC | PRN
  Administered 2018-04-05: 9.9 via INTRAVENOUS
  Filled 2018-04-05: qty 10

## 2018-04-05 MED ORDER — TECHNETIUM TC 99M TETROFOSMIN IV KIT
32.0000 | PACK | Freq: Once | INTRAVENOUS | Status: AC | PRN
  Administered 2018-04-05: 32 via INTRAVENOUS
  Filled 2018-04-05: qty 32

## 2018-04-06 ENCOUNTER — Telehealth: Payer: Self-pay | Admitting: *Deleted

## 2018-04-06 NOTE — Telephone Encounter (Signed)
-----   Message from Wellington Hampshire, MD sent at 04/06/2018  7:56 AM EDT ----- Inform patient that  stress test was normal.

## 2018-04-06 NOTE — Telephone Encounter (Signed)
Left a message for the patient to call back.  

## 2018-04-09 NOTE — Telephone Encounter (Signed)
Patient made aware of results and verbalized understanding.  

## 2018-04-09 NOTE — Telephone Encounter (Signed)
Pt is returning your call regarding his stress test results.

## 2018-04-23 DIAGNOSIS — B029 Zoster without complications: Secondary | ICD-10-CM | POA: Insufficient documentation

## 2018-05-09 DIAGNOSIS — B0229 Other postherpetic nervous system involvement: Secondary | ICD-10-CM | POA: Insufficient documentation

## 2018-08-15 DIAGNOSIS — L853 Xerosis cutis: Secondary | ICD-10-CM | POA: Insufficient documentation

## 2018-08-15 DIAGNOSIS — L309 Dermatitis, unspecified: Secondary | ICD-10-CM | POA: Insufficient documentation

## 2018-11-30 ENCOUNTER — Other Ambulatory Visit (HOSPITAL_COMMUNITY): Payer: Self-pay | Admitting: Internal Medicine

## 2018-11-30 ENCOUNTER — Ambulatory Visit (HOSPITAL_COMMUNITY)
Admission: RE | Admit: 2018-11-30 | Discharge: 2018-11-30 | Disposition: A | Discharge status: Home / Self Care | Payer: Medicare Other | Source: Ambulatory Visit | Attending: Family | Admitting: Family

## 2018-11-30 ENCOUNTER — Other Ambulatory Visit: Payer: Self-pay

## 2018-11-30 DIAGNOSIS — M79651 Pain in right thigh: Secondary | ICD-10-CM | POA: Insufficient documentation

## 2018-11-30 DIAGNOSIS — R600 Localized edema: Secondary | ICD-10-CM | POA: Insufficient documentation

## 2018-11-30 DIAGNOSIS — I129 Hypertensive chronic kidney disease with stage 1 through stage 4 chronic kidney disease, or unspecified chronic kidney disease: Secondary | ICD-10-CM | POA: Insufficient documentation

## 2018-12-12 ENCOUNTER — Telehealth: Payer: Self-pay | Admitting: *Deleted

## 2018-12-12 NOTE — Telephone Encounter (Signed)
A message was left, re: follow up visit. 

## 2019-01-22 ENCOUNTER — Telehealth: Payer: Self-pay | Admitting: *Deleted

## 2019-01-22 NOTE — Telephone Encounter (Signed)
A message was left, re: follow up visit. 

## 2019-04-09 ENCOUNTER — Encounter: Payer: Self-pay | Admitting: Cardiovascular Disease

## 2019-04-09 ENCOUNTER — Ambulatory Visit (INDEPENDENT_AMBULATORY_CARE_PROVIDER_SITE_OTHER): Payer: Medicare Other | Admitting: Cardiovascular Disease

## 2019-04-09 ENCOUNTER — Other Ambulatory Visit: Payer: Self-pay

## 2019-04-09 VITALS — BP 180/74 | HR 71 | Temp 96.9°F | Ht 74.0 in | Wt 194.0 lb

## 2019-04-09 DIAGNOSIS — I739 Peripheral vascular disease, unspecified: Secondary | ICD-10-CM

## 2019-04-09 DIAGNOSIS — E785 Hyperlipidemia, unspecified: Secondary | ICD-10-CM

## 2019-04-09 DIAGNOSIS — I10 Essential (primary) hypertension: Secondary | ICD-10-CM
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
## 2019-04-09 NOTE — Patient Instructions (Signed)
Medication Instructions:  Your physician recommends that you continue on your current medications as directed. Please refer to the Current Medication list given to you today.  If you need a refill on your cardiac medications before your next appointment, please call your pharmacy.   Lab work: None ordered If you have labs (blood work) drawn today and your tests are completely normal, you will receive your results only by: [place] (if you have MyChart) OR A paper copy in the mail If you have any lab test that is abnormal or we need to change your treatment, we will call you to review the results.  Testing/Procedures: Your physician has requested that you have a renal artery duplex. During this test, an ultrasound is used to evaluate blood flow to the kidneys. Take your medications as you usually do. This will take place at [address], [address].   No food after 11PM the night before.  Water is OK. (Don't drink liquids if you have been instructed not to for ANOTHER test).  Take two Extra-Strength Gas-X capsules at bedtime the night before test.   Take an additional two Extra-Strength Gas-X capsules three (3) hours before the test or first thing in the morning.    Avoid foods that produce bowel gas, for 24 hours prior to exam (see below).    No breakfast, no chewing gum, no smoking or carbonated beverages.  Patient may take morning medications with water.  Come in for test at least 15 minutes early to register.   Follow-Up: At [facility], you and your health needs are our priority.  As part of our continuing mission to provide you with exceptional heart care, we have created designated Provider Care Teams.  These Care Teams include your primary Cardiologist (physician) and Advanced Practice Providers (APPs -  Physician Assistants and Nurse Practitioners) who all work together to provide you with the care you need, when you need it. You will need a follow up  appointment in 12 months.  Please call our office 2 months in advance to schedule this appointment.  You may see Kathlyn Sacramento, MD or one of the following Advanced Practice Providers on your designated Care Team:   Kerin Ransom, PA-C [address], PA-C [place], [place]

## 2019-04-25 ENCOUNTER — Other Ambulatory Visit: Payer: Self-pay

## 2019-04-25 ENCOUNTER — Ambulatory Visit (HOSPITAL_COMMUNITY)
Admission: RE | Admit: 2019-04-25 | Discharge: 2019-04-25 | Disposition: A | Discharge status: Home / Self Care | Payer: Medicare Other | Source: Ambulatory Visit | Attending: Cardiology | Admitting: Cardiology

## 2019-04-25 DIAGNOSIS — I10 Essential (primary) hypertension: Secondary | ICD-10-CM | POA: Diagnosis not present

## 2019-08-03 ENCOUNTER — Ambulatory Visit: Payer: Medicare Other | Attending: Internal Medicine

## 2019-08-03 DIAGNOSIS — Z23 Encounter for immunization: Secondary | ICD-10-CM | POA: Insufficient documentation

## 2019-08-03 NOTE — Progress Notes (Signed)
   Covid-19 Vaccination Clinic  Name:  Xavier [name]    MRN: [id number] DOB: Jan 10, 1941  08/03/2019  Mr. [name] was observed post Covid-19 immunization for 15 minutes without incidence. He was provided with Vaccine Information Sheet and instruction to access the V-Safe system.   Mr. [name] was instructed to call 911 with any severe reactions post vaccine: Marland Kitchen Difficulty breathing  . Swelling of your face and throat  . A fast heartbeat  . A bad rash all over your body  . Dizziness and weakness    Immunizations Administered    Name Date Dose VIS Date Route   Pfizer COVID-19 Vaccine 08/03/2019  1:56 PM 0.3 mL 06/21/2019 Intramuscular   Manufacturer: [place]   Lot: [id number]   [place]: [id number]

## 2019-08-24 ENCOUNTER — Ambulatory Visit: Payer: Medicare Other | Attending: Internal Medicine

## 2019-08-24 DIAGNOSIS — Z23 Encounter for immunization: Secondary | ICD-10-CM | POA: Insufficient documentation

## 2019-08-24 NOTE — Progress Notes (Signed)
   Covid-19 Vaccination Clinic  Name:  Xavier [name]    MRN: [id number] DOB: 03/30/1941  08/24/2019  [name] was observed post Covid-19 immunization for 15 minutes without incidence. He was provided with Vaccine Information Sheet and instruction to access the V-Safe system.   [name] was instructed to call 911 with any severe reactions post vaccine: Marland Kitchen Difficulty breathing  . Swelling of your face and throat  . A fast heartbeat  . A bad rash all over your body  . Dizziness and weakness    Immunizations Administered    Name Date Dose VIS Date Route   Pfizer COVID-19 Vaccine 08/24/2019 12:36 PM 0.3 mL 06/21/2019 Intramuscular   Manufacturer: [place]   Lot: [id number]   [place]: [id number]

## 2019-11-21 DIAGNOSIS — K529 Noninfective gastroenteritis and colitis, unspecified: Secondary | ICD-10-CM | POA: Insufficient documentation

## 2020-01-16 DIAGNOSIS — K30 Functional dyspepsia: Secondary | ICD-10-CM | POA: Insufficient documentation

## 2020-03-25 ENCOUNTER — Other Ambulatory Visit: Payer: Self-pay

## 2020-03-25 ENCOUNTER — Encounter: Payer: Self-pay | Admitting: Podiatry

## 2020-03-25 ENCOUNTER — Ambulatory Visit: Payer: Medicare Other | Admitting: Podiatry

## 2020-03-25 DIAGNOSIS — F528 Other sexual dysfunction not due to a substance or known physiological condition: Secondary | ICD-10-CM

## 2020-03-25 DIAGNOSIS — M79675 Pain in left toe(s): Secondary | ICD-10-CM | POA: Diagnosis not present

## 2020-03-25 DIAGNOSIS — N183 Chronic kidney disease, stage 3 unspecified: Secondary | ICD-10-CM

## 2020-03-25 DIAGNOSIS — M79674 Pain in right toe(s): Secondary | ICD-10-CM

## 2020-03-25 DIAGNOSIS — B351 Tinea unguium: Secondary | ICD-10-CM | POA: Insufficient documentation

## 2020-03-25 DIAGNOSIS — N179 Acute kidney failure, unspecified: Secondary | ICD-10-CM

## 2020-03-25 NOTE — Progress Notes (Signed)
This patient returns to my office for at risk foot care.  This patient requires this care by a professional since this patient will be at risk due to having chronic kidney disease and diabetes.  This patient is unable to cut nails himself since the patient cannot reach his nails.These nails are painful walking and wearing shoes.  This patient presents for at risk foot care today.  General Appearance  Alert, conversant and in no acute stress.  Vascular  Dorsalis pedis  are palpable  bilaterally. Posterior tibial pulses are absent  B/L. Capillary return is within normal limits  bilaterally. Temperature is within normal limits  bilaterally.  Neurologic  Senn-Weinstein monofilament wire test within normal left and absent LOPS right.. Muscle power within normal limits bilaterally.  Nails Thick disfigured discolored nails with subungual debris  Hallux  B/L.Marland Kitchen No evidence of bacterial infection or drainage bilaterally.  Orthopedic  No limitations of motion  feet .  No crepitus or effusions noted.  No bony pathology or digital deformities noted.  Skin  normotropic skin with no porokeratosis noted bilaterally.  No signs of infections or ulcers noted.     Onychomycosis  Pain in right toes  Pain in left toes  Consent was obtained for treatment procedures.   Mechanical debridement of nails 1-5  bilaterally performed with a nail nipper.  Filed with dremel without incident.    Return office visit      3 months                Told patient to return for periodic foot care and evaluation due to potential at risk complications.   Gardiner Barefoot DPM

## 2020-04-14 ENCOUNTER — Other Ambulatory Visit: Payer: Self-pay

## 2020-04-14 ENCOUNTER — Encounter: Payer: Self-pay | Admitting: Cardiovascular Disease

## 2020-04-14 ENCOUNTER — Ambulatory Visit (INDEPENDENT_AMBULATORY_CARE_PROVIDER_SITE_OTHER): Payer: Medicare Other | Admitting: Cardiovascular Disease

## 2020-04-14 VITALS — BP 196/79 | HR 70 | Ht 74.0 in | Wt 196.8 lb

## 2020-04-14 DIAGNOSIS — R011 Cardiac murmur, unspecified: Secondary | ICD-10-CM | POA: Diagnosis not present

## 2020-04-14 DIAGNOSIS — E785 Hyperlipidemia, unspecified: Secondary | ICD-10-CM | POA: Diagnosis not present

## 2020-04-14 DIAGNOSIS — I739 Peripheral vascular disease, unspecified: Secondary | ICD-10-CM | POA: Diagnosis not present

## 2020-04-14 DIAGNOSIS — I10 Essential (primary) hypertension: Secondary | ICD-10-CM

## 2020-04-14 MED ORDER — AMLODIPINE BESYLATE 10 MG PO TABS: 10.0000 mg | ORAL_TABLET | Freq: Every day | ORAL | 1 refills | Status: DC

## 2020-04-14 NOTE — Patient Instructions (Signed)
Medication Instructions:  INCREASE the Amlodipine to 10 mg once daily  *If you need a refill on your cardiac medications before your next appointment, please call your pharmacy*   Lab Work: None ordered If you have labs (blood work) drawn today and your tests are completely normal, you will receive your results only by:  [place] (if you have MyChart) OR  A paper copy in the mail If you have any lab test that is abnormal or we need to change your treatment, we will call you to review the results.   Testing/Procedures: Your physician has requested that you have an echocardiogram. Echocardiography is a painless test that uses sound waves to create images of your heart. It provides your doctor with information about the size and shape of your heart and how well your hearts chambers and valves are working. You may receive an ultrasound enhancing agent through an IV if needed to better visualize your heart during the echo.This procedure takes approximately one hour. There are no restrictions for this procedure. This will take place at the [address], [address].   Follow-Up: At [facility], you and your health needs are our priority.  As part of our continuing mission to provide you with exceptional heart care, we have created designated Provider Care Teams.  These Care Teams include your primary Cardiologist (physician) and Advanced Practice Providers (APPs -  Physician Assistants and Nurse Practitioners) who all work together to provide you with the care you need, when you need it.  We recommend signing up for the patient portal called "MyChart".  Sign up information is provided on this After Visit Summary.  MyChart is used to connect with patients for Virtual Visits (Telemedicine).  Patients are able to view lab/test results, encounter notes, upcoming appointments, etc.  Non-urgent messages can be sent to your provider as well.   To learn more about what you can do with MyChart,  go to [email or web address].    Your next appointment:   6 month(s)  The format for your next appointment:   In Person  Provider:   Kathlyn Sacramento, MD
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
## 2020-04-30 ENCOUNTER — Ambulatory Visit (HOSPITAL_COMMUNITY): Payer: Medicare Other | Attending: Cardiology

## 2020-04-30 ENCOUNTER — Other Ambulatory Visit: Payer: Self-pay

## 2020-04-30 DIAGNOSIS — R011 Cardiac murmur, unspecified: Secondary | ICD-10-CM | POA: Diagnosis present

## 2020-04-30 LAB — ECHOCARDIOGRAM COMPLETE
AR max vel: 2.06 cm2
AV Area VTI: 2.23 cm2
AV Area mean vel: 2.09 cm2
AV Mean grad: 8 mmHg
AV Peak grad: 12.8 mmHg
Ao pk vel: 1.79 m/s
Area-P 1/2: 3.53 cm2
S' Lateral: 2.4 cm

## 2020-06-24 ENCOUNTER — Other Ambulatory Visit: Payer: Self-pay

## 2020-06-24 ENCOUNTER — Encounter: Payer: Self-pay | Admitting: Podiatry

## 2020-06-24 ENCOUNTER — Ambulatory Visit: Payer: Medicare Other | Admitting: Podiatry

## 2020-06-24 DIAGNOSIS — M79675 Pain in left toe(s): Secondary | ICD-10-CM | POA: Diagnosis not present

## 2020-06-24 DIAGNOSIS — E1122 Type 2 diabetes mellitus with diabetic chronic kidney disease: Secondary | ICD-10-CM | POA: Diagnosis not present

## 2020-06-24 DIAGNOSIS — N183 Chronic kidney disease, stage 3 unspecified: Secondary | ICD-10-CM

## 2020-06-24 DIAGNOSIS — N179 Acute kidney failure, unspecified: Secondary | ICD-10-CM | POA: Diagnosis not present

## 2020-06-24 DIAGNOSIS — B351 Tinea unguium: Secondary | ICD-10-CM

## 2020-06-24 DIAGNOSIS — Z794 Long term (current) use of insulin: Secondary | ICD-10-CM

## 2020-06-24 DIAGNOSIS — M79674 Pain in right toe(s): Secondary | ICD-10-CM

## 2020-06-24 NOTE — Progress Notes (Signed)
This patient returns to my office for at risk foot care.  This patient requires this care by a professional since this patient will be at risk due to having chronic kidney disease and diabetes.  This patient is unable to cut nails himself since the patient cannot reach his nails.These nails are painful walking and wearing shoes.  This patient presents for at risk foot care today.  General Appearance  Alert, conversant and in no acute stress.  Vascular  Dorsalis pedis  are palpable  bilaterally. Posterior tibial pulses are absent  B/L. Capillary return is within normal limits  bilaterally. Temperature is within normal limits  Bilaterally.  Absent digital hair.  Neurologic  Senn-Weinstein monofilament wire test within normal left and absent LOPS right.. Muscle power within normal limits bilaterally.  Nails Thick disfigured discolored nails with subungual debris  Hallux  B/L.Marland Kitchen No evidence of bacterial infection or drainage bilaterally.  Orthopedic  No limitations of motion  feet .  No crepitus or effusions noted.  No bony pathology or digital deformities noted.  Skin  normotropic skin with no porokeratosis noted bilaterally.  No signs of infections or ulcers noted.     Onychomycosis  Pain in right toes  Pain in left toes  Consent was obtained for treatment procedures.   Mechanical debridement of nails 1-5  bilaterally performed with a nail nipper.  Filed with dremel without incident.    Return office visit      3 months                Told patient to return for periodic foot care and evaluation due to potential at risk complications.   Gardiner Barefoot DPM

## 2020-09-30 ENCOUNTER — Other Ambulatory Visit: Payer: Self-pay

## 2020-09-30 ENCOUNTER — Encounter: Payer: Self-pay | Admitting: Podiatry

## 2020-09-30 ENCOUNTER — Ambulatory Visit: Payer: Medicare Other | Admitting: Podiatry

## 2020-09-30 DIAGNOSIS — K219 Gastro-esophageal reflux disease without esophagitis: Secondary | ICD-10-CM | POA: Insufficient documentation

## 2020-09-30 DIAGNOSIS — K2990 Gastroduodenitis, unspecified, without bleeding: Secondary | ICD-10-CM | POA: Insufficient documentation

## 2020-09-30 DIAGNOSIS — Z794 Long term (current) use of insulin: Secondary | ICD-10-CM

## 2020-09-30 DIAGNOSIS — E1122 Type 2 diabetes mellitus with diabetic chronic kidney disease: Secondary | ICD-10-CM | POA: Diagnosis not present

## 2020-09-30 DIAGNOSIS — N183 Chronic kidney disease, stage 3 unspecified: Secondary | ICD-10-CM

## 2020-09-30 DIAGNOSIS — B351 Tinea unguium: Secondary | ICD-10-CM | POA: Diagnosis not present

## 2020-09-30 DIAGNOSIS — M79674 Pain in right toe(s): Secondary | ICD-10-CM

## 2020-09-30 DIAGNOSIS — N179 Acute kidney failure, unspecified: Secondary | ICD-10-CM

## 2020-09-30 DIAGNOSIS — M79675 Pain in left toe(s): Secondary | ICD-10-CM

## 2020-09-30 DIAGNOSIS — R109 Unspecified abdominal pain: Secondary | ICD-10-CM | POA: Insufficient documentation

## 2020-09-30 NOTE — Progress Notes (Signed)
This patient returns to my office for at risk foot care.  This patient requires this care by a professional since this patient will be at risk due to having chronic kidney disease and diabetes.  This patient is unable to cut nails himself since the patient cannot reach his nails.These nails are painful walking and wearing shoes.  This patient presents for at risk foot care today.  General Appearance  Alert, conversant and in no acute stress.  Vascular  Dorsalis pedis  are palpable  bilaterally. Posterior tibial pulses are absent  B/L. Capillary return is within normal limits  bilaterally. Temperature is within normal limits  Bilaterally.  Absent digital hair.  Neurologic  Senn-Weinstein monofilament wire test within normal left and absent LOPS right.. Muscle power within normal limits bilaterally.  Nails Thick disfigured discolored nails with subungual debris  Hallux  B/L.Marland Kitchen No evidence of bacterial infection or drainage bilaterally.  Orthopedic  No limitations of motion  feet .  No crepitus or effusions noted.  No bony pathology or digital deformities noted.  Skin  normotropic skin with no porokeratosis noted bilaterally.  No signs of infections or ulcers noted.     Onychomycosis  Pain in right toes  Pain in left toes  Consent was obtained for treatment procedures.   Mechanical debridement of nails 1-5  bilaterally performed with a nail nipper.  Filed with dremel without incident.    Return office visit      3 months                Told patient to return for periodic foot care and evaluation due to potential at risk complications.   Gardiner Barefoot DPM

## 2020-11-24 ENCOUNTER — Other Ambulatory Visit: Payer: Self-pay

## 2020-11-24 ENCOUNTER — Ambulatory Visit: Payer: Medicare Other | Admitting: Cardiovascular Disease

## 2020-11-24 ENCOUNTER — Encounter: Payer: Self-pay | Admitting: Cardiovascular Disease

## 2020-11-24 VITALS — BP 142/62 | HR 57 | Ht 73.0 in | Wt 186.2 lb

## 2020-11-24 DIAGNOSIS — R011 Cardiac murmur, unspecified: Secondary | ICD-10-CM | POA: Diagnosis not present

## 2020-11-24 DIAGNOSIS — E785 Hyperlipidemia, unspecified: Secondary | ICD-10-CM | POA: Diagnosis not present

## 2020-11-24 DIAGNOSIS — I739 Peripheral vascular disease, unspecified: Secondary | ICD-10-CM | POA: Diagnosis not present

## 2020-11-24 DIAGNOSIS — I10 Essential (primary) hypertension: Secondary | ICD-10-CM | POA: Diagnosis not present

## 2020-11-24 NOTE — Patient Instructions (Signed)
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
## 2021-01-06 ENCOUNTER — Other Ambulatory Visit: Payer: Self-pay

## 2021-01-06 ENCOUNTER — Ambulatory Visit: Payer: Medicare Other | Admitting: Podiatry

## 2021-01-06 ENCOUNTER — Encounter: Payer: Self-pay | Admitting: Podiatry

## 2021-01-06 DIAGNOSIS — N179 Acute kidney failure, unspecified: Secondary | ICD-10-CM | POA: Diagnosis not present

## 2021-01-06 DIAGNOSIS — M79674 Pain in right toe(s): Secondary | ICD-10-CM

## 2021-01-06 DIAGNOSIS — M79675 Pain in left toe(s): Secondary | ICD-10-CM

## 2021-01-06 DIAGNOSIS — Z794 Long term (current) use of insulin: Secondary | ICD-10-CM

## 2021-01-06 DIAGNOSIS — E1122 Type 2 diabetes mellitus with diabetic chronic kidney disease: Secondary | ICD-10-CM

## 2021-01-06 DIAGNOSIS — B351 Tinea unguium: Secondary | ICD-10-CM | POA: Diagnosis not present

## 2021-01-06 DIAGNOSIS — N183 Chronic kidney disease, stage 3 unspecified: Secondary | ICD-10-CM

## 2021-01-06 NOTE — Progress Notes (Signed)
This patient returns to my office for at risk foot care.  This patient requires this care by a professional since this patient will be at risk due to having chronic kidney disease and diabetes.  This patient is unable to cut nails himself since the patient cannot reach his nails.These nails are painful walking and wearing shoes.  This patient presents for at risk foot care today.  General Appearance  Alert, conversant and in no acute stress.  Vascular  Dorsalis pedis  are palpable  bilaterally. Posterior tibial pulses are absent  B/L. Capillary return is within normal limits  bilaterally. Temperature is within normal limits  Bilaterally.  Absent digital hair.  Neurologic  Senn-Weinstein monofilament wire test within normal left and absent LOPS right.. Muscle power within normal limits bilaterally.  Nails Thick disfigured discolored nails with subungual debris  Hallux  B/L.Marland Kitchen No evidence of bacterial infection or drainage bilaterally.  Orthopedic  No limitations of motion  feet .  No crepitus or effusions noted.  No bony pathology or digital deformities noted.  Skin  normotropic skin with no porokeratosis noted bilaterally.  No signs of infections or ulcers noted.     Onychomycosis  Pain in right toes  Pain in left toes  Consent was obtained for treatment procedures.   Mechanical debridement of nails 1-5  bilaterally performed with a nail nipper.  Filed with dremel without incident.    Return office visit      3 months                Told patient to return for periodic foot care and evaluation due to potential at risk complications.   Gardiner Barefoot DPM

## 2021-02-22 ENCOUNTER — Ambulatory Visit: Payer: Medicare Other | Attending: Internal Medicine

## 2021-02-22 ENCOUNTER — Other Ambulatory Visit: Payer: Self-pay

## 2021-02-22 ENCOUNTER — Other Ambulatory Visit (HOSPITAL_BASED_OUTPATIENT_CLINIC_OR_DEPARTMENT_OTHER): Payer: Self-pay

## 2021-02-22 DIAGNOSIS — Z23 Encounter for immunization: Secondary | ICD-10-CM

## 2021-02-22 MED ORDER — PFIZER-BIONT COVID-19 VAC-TRIS 30 MCG/0.3ML IM SUSP
INTRAMUSCULAR | 0 refills | Status: DC
  Filled 2021-02-22: qty 0.3, 1d supply, fill #0

## 2021-02-22 NOTE — Progress Notes (Signed)
   Covid-19 Vaccination Clinic  Name:  Xavier [name]    MRN: [id number] DOB: Apr 17, 1941  02/22/2021  Mr. Suite was observed post Covid-19 immunization for 15 minutes without incident. He was provided with Vaccine Information Sheet and instruction to access the V-Safe system.   [name] was instructed to call 911 with any severe reactions post vaccine: Difficulty breathing  Swelling of face and throat  A fast heartbeat  A bad rash all over body  Dizziness and weakness   Immunizations Administered     Name Date Dose VIS Date Route   PFIZER Comrnaty([place]) Covid-19 Vaccine 02/22/2021 10:03 AM 0.3 mL 06/18/2020 Intramuscular   Manufacturer: [place]   Lot: [id number]   NDC: [id number]

## 2021-04-09 ENCOUNTER — Ambulatory Visit: Payer: Medicare Other | Admitting: Podiatry

## 2021-04-26 ENCOUNTER — Other Ambulatory Visit: Payer: Self-pay | Admitting: Internal Medicine

## 2021-04-26 DIAGNOSIS — M5136 Other intervertebral disc degeneration, lumbar region: Secondary | ICD-10-CM

## 2021-05-21 ENCOUNTER — Ambulatory Visit
Admission: RE | Admit: 2021-05-21 | Discharge: 2021-05-21 | Disposition: A | Discharge status: Home / Self Care | Payer: Medicare Other | Source: Ambulatory Visit | Attending: Internal Medicine | Admitting: Internal Medicine

## 2021-05-21 ENCOUNTER — Ambulatory Visit: Payer: Medicare Other | Admitting: Podiatry

## 2021-05-21 ENCOUNTER — Encounter: Payer: Self-pay | Admitting: Podiatry

## 2021-05-21 ENCOUNTER — Other Ambulatory Visit: Payer: Self-pay

## 2021-05-21 DIAGNOSIS — M5136 Other intervertebral disc degeneration, lumbar region: Secondary | ICD-10-CM

## 2021-05-21 DIAGNOSIS — B351 Tinea unguium: Secondary | ICD-10-CM

## 2021-05-21 DIAGNOSIS — N183 Chronic kidney disease, stage 3 unspecified: Secondary | ICD-10-CM

## 2021-05-21 DIAGNOSIS — Z794 Long term (current) use of insulin: Secondary | ICD-10-CM

## 2021-05-21 DIAGNOSIS — N179 Acute kidney failure, unspecified: Secondary | ICD-10-CM

## 2021-05-21 DIAGNOSIS — M79675 Pain in left toe(s): Secondary | ICD-10-CM | POA: Diagnosis not present

## 2021-05-21 DIAGNOSIS — E1122 Type 2 diabetes mellitus with diabetic chronic kidney disease: Secondary | ICD-10-CM | POA: Diagnosis not present

## 2021-05-21 DIAGNOSIS — M79674 Pain in right toe(s): Secondary | ICD-10-CM

## 2021-05-21 NOTE — Progress Notes (Signed)
This patient returns to my office for at risk foot care.  This patient requires this care by a professional since this patient will be at risk due to having chronic kidney disease and diabetes.  This patient is unable to cut nails himself since the patient cannot reach his nails.These nails are painful walking and wearing shoes.  This patient presents for at risk foot care today.  General Appearance  Alert, conversant and in no acute stress.  Vascular  Dorsalis pedis  are palpable  bilaterally. Posterior tibial pulses are absent  B/L. Capillary return is within normal limits  bilaterally. Temperature is within normal limits  Bilaterally.  Absent digital hair.  Neurologic  Senn-Weinstein monofilament wire test within normal left and absent LOPS right.. Muscle power within normal limits bilaterally.  Nails Thick disfigured discolored nails with subungual debris  Hallux  B/L.Marland Kitchen No evidence of bacterial infection or drainage bilaterally.  Orthopedic  No limitations of motion  feet .  No crepitus or effusions noted.  No bony pathology or digital deformities noted.  Skin  normotropic skin with no porokeratosis noted bilaterally.  No signs of infections or ulcers noted.     Onychomycosis  Pain in right toes  Pain in left toes  Consent was obtained for treatment procedures.   Mechanical debridement of nails 1-5  bilaterally performed with a nail nipper.  Filed with dremel without incident.    Return office visit      3 months                Told patient to return for periodic foot care and evaluation due to potential at risk complications.   Gardiner Barefoot DPM

## 2021-06-22 ENCOUNTER — Other Ambulatory Visit: Payer: Self-pay | Admitting: Cardiovascular Disease

## 2021-07-14 DIAGNOSIS — H40053 Ocular hypertension, bilateral: Secondary | ICD-10-CM | POA: Diagnosis not present

## 2021-07-14 DIAGNOSIS — H40023 Open angle with borderline findings, high risk, bilateral: Secondary | ICD-10-CM | POA: Diagnosis not present

## 2021-07-14 DIAGNOSIS — Z9849 Cataract extraction status, unspecified eye: Secondary | ICD-10-CM | POA: Diagnosis not present

## 2021-07-14 DIAGNOSIS — H52223 Regular astigmatism, bilateral: Secondary | ICD-10-CM | POA: Diagnosis not present

## 2021-07-14 DIAGNOSIS — H5340 Unspecified visual field defects: Secondary | ICD-10-CM | POA: Diagnosis not present

## 2021-07-14 DIAGNOSIS — H524 Presbyopia: Secondary | ICD-10-CM | POA: Diagnosis not present

## 2021-07-14 DIAGNOSIS — H5213 Myopia, bilateral: Secondary | ICD-10-CM | POA: Diagnosis not present

## 2021-07-14 DIAGNOSIS — Z961 Presence of intraocular lens: Secondary | ICD-10-CM | POA: Diagnosis not present

## 2021-08-17 DIAGNOSIS — I2510 Atherosclerotic heart disease of native coronary artery without angina pectoris: Secondary | ICD-10-CM | POA: Diagnosis not present

## 2021-08-17 DIAGNOSIS — Z794 Long term (current) use of insulin: Secondary | ICD-10-CM | POA: Diagnosis not present

## 2021-08-17 DIAGNOSIS — I129 Hypertensive chronic kidney disease with stage 1 through stage 4 chronic kidney disease, or unspecified chronic kidney disease: Secondary | ICD-10-CM | POA: Diagnosis not present

## 2021-08-17 DIAGNOSIS — E1159 Type 2 diabetes mellitus with other circulatory complications: Secondary | ICD-10-CM | POA: Diagnosis not present

## 2021-08-17 DIAGNOSIS — N1831 Chronic kidney disease, stage 3a: Secondary | ICD-10-CM | POA: Diagnosis not present

## 2021-08-24 ENCOUNTER — Ambulatory Visit (INDEPENDENT_AMBULATORY_CARE_PROVIDER_SITE_OTHER): Payer: PPO | Admitting: Podiatry

## 2021-08-24 ENCOUNTER — Encounter: Payer: Self-pay | Admitting: Podiatry

## 2021-08-24 ENCOUNTER — Other Ambulatory Visit: Payer: Self-pay

## 2021-08-24 DIAGNOSIS — E1122 Type 2 diabetes mellitus with diabetic chronic kidney disease: Secondary | ICD-10-CM

## 2021-08-24 DIAGNOSIS — M79675 Pain in left toe(s): Secondary | ICD-10-CM | POA: Diagnosis not present

## 2021-08-24 DIAGNOSIS — Z794 Long term (current) use of insulin: Secondary | ICD-10-CM

## 2021-08-24 DIAGNOSIS — M79674 Pain in right toe(s): Secondary | ICD-10-CM

## 2021-08-24 DIAGNOSIS — B351 Tinea unguium: Secondary | ICD-10-CM | POA: Diagnosis not present

## 2021-08-24 DIAGNOSIS — N179 Acute kidney failure, unspecified: Secondary | ICD-10-CM

## 2021-08-24 DIAGNOSIS — N183 Chronic kidney disease, stage 3 unspecified: Secondary | ICD-10-CM

## 2021-08-24 NOTE — Progress Notes (Signed)
This patient returns to my office for at risk foot care.  This patient requires this care by a professional since this patient will be at risk due to having chronic kidney disease and diabetes.  This patient is unable to cut nails himself since the patient cannot reach his nails.These nails are painful walking and wearing shoes.  This patient presents for at risk foot care today.  General Appearance  Alert, conversant and in no acute stress.  Vascular  Dorsalis pedis  are  weakly palpable  bilaterally. Posterior tibial pulses are absent  B/L. Capillary return is within normal limits  bilaterally. Temperature is within normal limits  Bilaterally.  Absent digital hair.  Neurologic  Senn-Weinstein monofilament wire test within normal left and absent LOPS right.. Muscle power within normal limits bilaterally.  Nails Thick disfigured discolored nails with subungual debris  Hallux  B/L.. No evidence of bacterial infection or drainage bilaterally.  Orthopedic  No limitations of motion  feet .  No crepitus or effusions noted.  No bony pathology or digital deformities noted.  Skin  normotropic skin with no porokeratosis noted bilaterally.  No signs of infections or ulcers noted.     Onychomycosis  Pain in right toes  Pain in left toes  Consent was obtained for treatment procedures.   Mechanical debridement of nails 1-5  bilaterally performed with a nail nipper.  Filed with dremel without incident.    Return office visit      3 months                Told patient to return for periodic foot care and evaluation due to potential at risk complications.   Kashlyn Salinas DPM  

## 2021-10-11 DIAGNOSIS — H52223 Regular astigmatism, bilateral: Secondary | ICD-10-CM | POA: Diagnosis not present

## 2021-10-11 DIAGNOSIS — H40013 Open angle with borderline findings, low risk, bilateral: Secondary | ICD-10-CM | POA: Diagnosis not present

## 2021-10-11 DIAGNOSIS — H40023 Open angle with borderline findings, high risk, bilateral: Secondary | ICD-10-CM | POA: Diagnosis not present

## 2021-10-11 DIAGNOSIS — H5212 Myopia, left eye: Secondary | ICD-10-CM | POA: Diagnosis not present

## 2021-10-19 DIAGNOSIS — R413 Other amnesia: Secondary | ICD-10-CM | POA: Diagnosis not present

## 2021-10-19 DIAGNOSIS — E1159 Type 2 diabetes mellitus with other circulatory complications: Secondary | ICD-10-CM | POA: Diagnosis not present

## 2021-11-05 ENCOUNTER — Other Ambulatory Visit: Payer: Self-pay | Admitting: Internal Medicine

## 2021-11-05 DIAGNOSIS — M5136 Other intervertebral disc degeneration, lumbar region: Secondary | ICD-10-CM

## 2021-11-22 ENCOUNTER — Ambulatory Visit
Admission: RE | Admit: 2021-11-22 | Discharge: 2021-11-22 | Disposition: A | Discharge status: Home / Self Care | Payer: HMO | Source: Ambulatory Visit | Attending: Internal Medicine | Admitting: Internal Medicine

## 2021-11-22 DIAGNOSIS — M5136 Other intervertebral disc degeneration, lumbar region: Secondary | ICD-10-CM

## 2021-11-22 DIAGNOSIS — R200 Anesthesia of skin: Secondary | ICD-10-CM | POA: Diagnosis not present

## 2021-11-22 DIAGNOSIS — M545 Low back pain, unspecified: Secondary | ICD-10-CM | POA: Diagnosis not present

## 2021-11-22 DIAGNOSIS — M51369 Other intervertebral disc degeneration, lumbar region without mention of lumbar back pain or lower extremity pain: Secondary | ICD-10-CM

## 2021-11-23 ENCOUNTER — Encounter: Payer: Self-pay | Admitting: Podiatry

## 2021-11-23 ENCOUNTER — Ambulatory Visit: Payer: PPO | Admitting: Podiatry

## 2021-11-23 DIAGNOSIS — M79674 Pain in right toe(s): Secondary | ICD-10-CM | POA: Diagnosis not present

## 2021-11-23 DIAGNOSIS — N179 Acute kidney failure, unspecified: Secondary | ICD-10-CM

## 2021-11-23 DIAGNOSIS — E1122 Type 2 diabetes mellitus with diabetic chronic kidney disease: Secondary | ICD-10-CM

## 2021-11-23 DIAGNOSIS — B351 Tinea unguium: Secondary | ICD-10-CM | POA: Diagnosis not present

## 2021-11-23 DIAGNOSIS — M79675 Pain in left toe(s): Secondary | ICD-10-CM | POA: Diagnosis not present

## 2021-11-23 DIAGNOSIS — Z794 Long term (current) use of insulin: Secondary | ICD-10-CM

## 2021-11-23 DIAGNOSIS — N183 Acute kidney failure, unspecified: Secondary | ICD-10-CM

## 2021-11-23 NOTE — Progress Notes (Signed)
This patient returns to my office for at risk foot care.  This patient requires this care by a professional since this patient will be at risk due to having chronic kidney disease and diabetes.  This patient is unable to cut nails himself since the patient cannot reach his nails.These nails are painful walking and wearing shoes.  This patient presents for at risk foot care today.  General Appearance  Alert, conversant and in no acute stress.  Vascular  Dorsalis pedis  are  weakly palpable  bilaterally. Posterior tibial pulses are absent  B/L. Capillary return is within normal limits  bilaterally. Temperature is within normal limits  Bilaterally.  Absent digital hair.  Neurologic  Senn-Weinstein monofilament wire test within normal left and absent LOPS right.. Muscle power within normal limits bilaterally.  Nails Thick disfigured discolored nails with subungual debris  Hallux  B/L.. No evidence of bacterial infection or drainage bilaterally.  Orthopedic  No limitations of motion  feet .  No crepitus or effusions noted.  No bony pathology or digital deformities noted.  Skin  normotropic skin with no porokeratosis noted bilaterally.  No signs of infections or ulcers noted.     Onychomycosis  Pain in right toes  Pain in left toes  Consent was obtained for treatment procedures.   Mechanical debridement of nails 1-5  bilaterally performed with a nail nipper.  Filed with dremel without incident.    Return office visit      3 months                Told patient to return for periodic foot care and evaluation due to potential at risk complications.   Lunabella Badgett DPM  

## 2021-11-30 DIAGNOSIS — E1159 Type 2 diabetes mellitus with other circulatory complications: Secondary | ICD-10-CM | POA: Diagnosis not present

## 2021-11-30 DIAGNOSIS — L84 Corns and callosities: Secondary | ICD-10-CM | POA: Diagnosis not present

## 2021-11-30 DIAGNOSIS — I129 Hypertensive chronic kidney disease with stage 1 through stage 4 chronic kidney disease, or unspecified chronic kidney disease: Secondary | ICD-10-CM | POA: Diagnosis not present

## 2021-11-30 DIAGNOSIS — I6389 Other cerebral infarction: Secondary | ICD-10-CM | POA: Diagnosis not present

## 2021-11-30 DIAGNOSIS — I10 Essential (primary) hypertension: Secondary | ICD-10-CM | POA: Diagnosis not present

## 2021-11-30 DIAGNOSIS — M47816 Spondylosis without myelopathy or radiculopathy, lumbar region: Secondary | ICD-10-CM | POA: Diagnosis not present

## 2021-11-30 DIAGNOSIS — I739 Peripheral vascular disease, unspecified: Secondary | ICD-10-CM | POA: Diagnosis not present

## 2021-11-30 DIAGNOSIS — E785 Hyperlipidemia, unspecified: Secondary | ICD-10-CM | POA: Diagnosis not present

## 2021-11-30 DIAGNOSIS — N1831 Chronic kidney disease, stage 3a: Secondary | ICD-10-CM | POA: Diagnosis not present

## 2021-11-30 DIAGNOSIS — C61 Malignant neoplasm of prostate: Secondary | ICD-10-CM | POA: Diagnosis not present
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
## 2021-12-07 ENCOUNTER — Ambulatory Visit (INDEPENDENT_AMBULATORY_CARE_PROVIDER_SITE_OTHER): Payer: HMO | Admitting: Cardiovascular Disease

## 2021-12-07 ENCOUNTER — Encounter: Payer: Self-pay | Admitting: Cardiovascular Disease

## 2021-12-07 VITALS — BP 160/78 | HR 61 | Ht 73.0 in | Wt 180.2 lb

## 2021-12-07 DIAGNOSIS — R011 Cardiac murmur, unspecified: Secondary | ICD-10-CM

## 2021-12-07 DIAGNOSIS — I739 Peripheral vascular disease, unspecified: Secondary | ICD-10-CM

## 2021-12-07 DIAGNOSIS — I10 Essential (primary) hypertension: Secondary | ICD-10-CM

## 2021-12-07 DIAGNOSIS — E785 Hyperlipidemia, unspecified: Secondary | ICD-10-CM

## 2021-12-07 NOTE — Patient Instructions (Signed)
Medication Instructions:  ?No changes ?*If you need a refill on your cardiac medications before your next appointment, please call your pharmacy* ? ? ?Lab Work: ?None ordered ?If you have labs (blood work) drawn today and your tests are completely normal, you will receive your results only by: ?MyChart Message (if you have MyChart) OR ?A paper copy in the mail ?If you have any lab test that is abnormal or we need to change your treatment, we will call you to review the results. ? ? ?Testing/Procedures: ?None ordered ? ? ?Follow-Up: ?At [facility], you and your health needs are our priority.  As part of our continuing mission to provide you with exceptional heart care, we have created designated Provider Care Teams.  These Care Teams include your primary Cardiologist (physician) and Advanced Practice Providers (APPs -  Physician Assistants and Nurse Practitioners) who all work together to provide you with the care you need, when you need it. ? ?We recommend signing up for the patient portal called "MyChart".  Sign up information is provided on this After Visit Summary.  MyChart is used to connect with patients for Virtual Visits (Telemedicine).  Patients are able to view lab/test results, encounter notes, upcoming appointments, etc.  Non-urgent messages can be sent to your provider as well.   ?To learn more about what you can do with MyChart, go to [email or web address].   ? ?Your next appointment:   ?12 month(s) ? ?The format for your next appointment:   ?In Person ? ?Provider:   ?Dr. Arida ? ?Important Information About Sugar ? ? ? ? ? ? ?

## 2021-12-30 DIAGNOSIS — D126 Benign neoplasm of colon, unspecified: Secondary | ICD-10-CM | POA: Diagnosis not present

## 2021-12-30 DIAGNOSIS — I129 Hypertensive chronic kidney disease with stage 1 through stage 4 chronic kidney disease, or unspecified chronic kidney disease: Secondary | ICD-10-CM | POA: Diagnosis not present

## 2021-12-30 DIAGNOSIS — E1159 Type 2 diabetes mellitus with other circulatory complications: Secondary | ICD-10-CM | POA: Diagnosis not present

## 2021-12-30 DIAGNOSIS — D499 Neoplasm of unspecified behavior of unspecified site: Secondary | ICD-10-CM | POA: Diagnosis not present

## 2021-12-30 DIAGNOSIS — R739 Hyperglycemia, unspecified: Secondary | ICD-10-CM | POA: Diagnosis not present

## 2021-12-30 DIAGNOSIS — R319 Hematuria, unspecified: Secondary | ICD-10-CM | POA: Diagnosis not present

## 2021-12-30 DIAGNOSIS — C61 Malignant neoplasm of prostate: Secondary | ICD-10-CM | POA: Diagnosis not present

## 2021-12-30 DIAGNOSIS — K921 Melena: Secondary | ICD-10-CM | POA: Diagnosis not present

## 2022-01-05 ENCOUNTER — Encounter: Payer: Self-pay | Admitting: Physician Assistant

## 2022-01-05 ENCOUNTER — Ambulatory Visit: Payer: HMO | Admitting: Physician Assistant

## 2022-01-05 VITALS — BP 160/78 | HR 60 | Ht 73.0 in | Wt 185.0 lb

## 2022-01-05 DIAGNOSIS — Z86010 Personal history of colonic polyps: Secondary | ICD-10-CM | POA: Diagnosis not present
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
## 2022-01-05 NOTE — Patient Instructions (Signed)
If you are age 82 or older, your body mass index should be between 23-30. Your Body mass index is 24.41 kg/m. If this is out of the aforementioned range listed, please consider follow up with your Primary Care Provider. ________________________________________________________  The [place] [facility] providers would like to encourage you to use [facility] to communicate with providers for non-urgent requests or questions.  Due to long hold times on the telephone, sending your provider a message by [facility] may be a faster and more efficient way to get a response.  Please allow 48 business hours for a response.  Please remember that this is for non-urgent requests.  _______________________________________________________  Call the office or send a MyChart if you decide that you would like to schedule to do a colonoscopy. Ask for Eustaquio Maize, Amy's nurse.  Thank you for entrusting me with your care and choosing [facility].  Amy Esterwood, PA-C

## 2022-01-12 NOTE — Progress Notes (Signed)
____________________________________________________________  Attending physician addendum:  Thank you for sending this case to me. I have reviewed the entire note and reviewed my prior colonoscopy and pathology results.  Given his relatively low risk findings on last colonoscopy as well as low risk findings on his 2010 exam and considering his age and current guidelines, I no longer recommended a surveillance colonoscopy for him.  Wilfrid Lund, MD  ____________________________________________________________

## 2022-01-20 DIAGNOSIS — N1831 Chronic kidney disease, stage 3a: Secondary | ICD-10-CM | POA: Diagnosis not present

## 2022-01-20 DIAGNOSIS — E1159 Type 2 diabetes mellitus with other circulatory complications: Secondary | ICD-10-CM | POA: Diagnosis not present

## 2022-01-20 DIAGNOSIS — I129 Hypertensive chronic kidney disease with stage 1 through stage 4 chronic kidney disease, or unspecified chronic kidney disease: Secondary | ICD-10-CM | POA: Diagnosis not present

## 2022-01-20 DIAGNOSIS — Z794 Long term (current) use of insulin: Secondary | ICD-10-CM | POA: Diagnosis not present

## 2022-01-20 DIAGNOSIS — Z9114 Patient's other noncompliance with medication regimen: Secondary | ICD-10-CM | POA: Diagnosis not present

## 2022-01-20 DIAGNOSIS — I2510 Atherosclerotic heart disease of native coronary artery without angina pectoris: Secondary | ICD-10-CM | POA: Diagnosis not present

## 2022-02-02 DIAGNOSIS — H40013 Open angle with borderline findings, low risk, bilateral: Secondary | ICD-10-CM | POA: Diagnosis not present

## 2022-02-02 DIAGNOSIS — H52223 Regular astigmatism, bilateral: Secondary | ICD-10-CM | POA: Diagnosis not present

## 2022-02-02 DIAGNOSIS — H40003 Preglaucoma, unspecified, bilateral: Secondary | ICD-10-CM | POA: Diagnosis not present

## 2022-02-02 DIAGNOSIS — H5212 Myopia, left eye: Secondary | ICD-10-CM | POA: Diagnosis not present

## 2022-02-02 DIAGNOSIS — H40023 Open angle with borderline findings, high risk, bilateral: Secondary | ICD-10-CM | POA: Diagnosis not present

## 2022-02-16 DIAGNOSIS — C61 Malignant neoplasm of prostate: Secondary | ICD-10-CM | POA: Diagnosis not present

## 2022-02-23 ENCOUNTER — Ambulatory Visit: Payer: PPO | Admitting: Podiatry

## 2022-02-23 ENCOUNTER — Encounter: Payer: Self-pay | Admitting: Podiatry

## 2022-02-23 DIAGNOSIS — B351 Tinea unguium: Secondary | ICD-10-CM

## 2022-02-23 DIAGNOSIS — M79674 Pain in right toe(s): Secondary | ICD-10-CM | POA: Diagnosis not present

## 2022-02-23 DIAGNOSIS — N5201 Erectile dysfunction due to arterial insufficiency: Secondary | ICD-10-CM | POA: Diagnosis not present

## 2022-02-23 DIAGNOSIS — Z794 Long term (current) use of insulin: Secondary | ICD-10-CM

## 2022-02-23 DIAGNOSIS — R310 Gross hematuria: Secondary | ICD-10-CM | POA: Diagnosis not present

## 2022-02-23 DIAGNOSIS — N179 Acute kidney failure, unspecified: Secondary | ICD-10-CM

## 2022-02-23 DIAGNOSIS — E1122 Type 2 diabetes mellitus with diabetic chronic kidney disease: Secondary | ICD-10-CM

## 2022-02-23 DIAGNOSIS — M79675 Pain in left toe(s): Secondary | ICD-10-CM

## 2022-02-23 DIAGNOSIS — C61 Malignant neoplasm of prostate: Secondary | ICD-10-CM | POA: Diagnosis not present

## 2022-02-23 DIAGNOSIS — N183 Chronic kidney disease, stage 3 unspecified: Secondary | ICD-10-CM

## 2022-02-23 NOTE — Progress Notes (Signed)
This patient returns to my office for at risk foot care.  This patient requires this care by a professional since this patient will be at risk due to having chronic kidney disease and diabetes.  This patient is unable to cut nails himself since the patient cannot reach his nails.These nails are painful walking and wearing shoes.  This patient presents for at risk foot care today.  General Appearance  Alert, conversant and in no acute stress.  Vascular  Dorsalis pedis  are  weakly palpable  bilaterally. Posterior tibial pulses are absent  B/L. Capillary return is within normal limits  bilaterally. Temperature is within normal limits  Bilaterally.  Absent digital hair.  Neurologic  Senn-Weinstein monofilament wire test within normal left and absent LOPS right.. Muscle power within normal limits bilaterally.  Nails Thick disfigured discolored nails with subungual debris  Hallux  B/L.. No evidence of bacterial infection or drainage bilaterally.  Orthopedic  No limitations of motion  feet .  No crepitus or effusions noted.  No bony pathology or digital deformities noted.  Skin  normotropic skin with no porokeratosis noted bilaterally.  No signs of infections or ulcers noted.     Onychomycosis  Pain in right toes  Pain in left toes  Consent was obtained for treatment procedures.   Mechanical debridement of nails 1-5  bilaterally performed with a nail nipper.  Filed with dremel without incident.    Return office visit      3 months                Told patient to return for periodic foot care and evaluation due to potential at risk complications.   Genesis Novosad DPM  

## 2022-02-28 DIAGNOSIS — N281 Cyst of kidney, acquired: Secondary | ICD-10-CM | POA: Diagnosis not present

## 2022-02-28 DIAGNOSIS — R310 Gross hematuria: Secondary | ICD-10-CM | POA: Diagnosis not present

## 2022-02-28 DIAGNOSIS — K5730 Diverticulosis of large intestine without perforation or abscess without bleeding: Secondary | ICD-10-CM | POA: Diagnosis not present

## 2022-02-28 DIAGNOSIS — N134 Hydroureter: Secondary | ICD-10-CM | POA: Diagnosis not present

## 2022-03-01 DIAGNOSIS — R310 Gross hematuria: Secondary | ICD-10-CM | POA: Diagnosis not present

## 2022-03-24 ENCOUNTER — Other Ambulatory Visit (HOSPITAL_BASED_OUTPATIENT_CLINIC_OR_DEPARTMENT_OTHER): Payer: Self-pay

## 2022-03-24 MED ORDER — ZOSTER VAC RECOMB ADJUVANTED 50 MCG/0.5ML IM SUSR
INTRAMUSCULAR | 0 refills | Status: DC
  Filled 2022-03-24: qty 0.5, 1d supply, fill #0

## 2022-04-27 DIAGNOSIS — E785 Hyperlipidemia, unspecified: Secondary | ICD-10-CM | POA: Diagnosis not present

## 2022-04-27 DIAGNOSIS — R7989 Other specified abnormal findings of blood chemistry: Secondary | ICD-10-CM | POA: Diagnosis not present

## 2022-04-27 DIAGNOSIS — Z125 Encounter for screening for malignant neoplasm of prostate: Secondary | ICD-10-CM | POA: Diagnosis not present

## 2022-04-27 DIAGNOSIS — I10 Essential (primary) hypertension: Secondary | ICD-10-CM | POA: Diagnosis not present

## 2022-04-27 DIAGNOSIS — R739 Hyperglycemia, unspecified: Secondary | ICD-10-CM | POA: Diagnosis not present

## 2022-05-02 DIAGNOSIS — R82998 Other abnormal findings in urine: Secondary | ICD-10-CM | POA: Diagnosis not present

## 2022-05-02 DIAGNOSIS — I739 Peripheral vascular disease, unspecified: Secondary | ICD-10-CM | POA: Diagnosis not present

## 2022-05-02 DIAGNOSIS — I6389 Other cerebral infarction: Secondary | ICD-10-CM | POA: Diagnosis not present

## 2022-05-02 DIAGNOSIS — N1831 Chronic kidney disease, stage 3a: Secondary | ICD-10-CM | POA: Diagnosis not present

## 2022-05-02 DIAGNOSIS — E785 Hyperlipidemia, unspecified: Secondary | ICD-10-CM | POA: Diagnosis not present

## 2022-05-02 DIAGNOSIS — Z0000 Encounter for general adult medical examination without abnormal findings: Secondary | ICD-10-CM | POA: Diagnosis not present

## 2022-05-02 DIAGNOSIS — C61 Malignant neoplasm of prostate: Secondary | ICD-10-CM | POA: Diagnosis not present

## 2022-05-02 DIAGNOSIS — I129 Hypertensive chronic kidney disease with stage 1 through stage 4 chronic kidney disease, or unspecified chronic kidney disease: Secondary | ICD-10-CM | POA: Diagnosis not present

## 2022-05-02 DIAGNOSIS — I700 Atherosclerosis of aorta: Secondary | ICD-10-CM | POA: Diagnosis not present

## 2022-05-02 DIAGNOSIS — J069 Acute upper respiratory infection, unspecified: Secondary | ICD-10-CM | POA: Diagnosis not present

## 2022-05-02 DIAGNOSIS — Z1152 Encounter for screening for COVID-19: Secondary | ICD-10-CM | POA: Diagnosis not present

## 2022-05-02 DIAGNOSIS — E1159 Type 2 diabetes mellitus with other circulatory complications: Secondary | ICD-10-CM | POA: Diagnosis not present

## 2022-05-02 DIAGNOSIS — Z794 Long term (current) use of insulin: Secondary | ICD-10-CM | POA: Diagnosis not present

## 2022-05-02 DIAGNOSIS — Z1339 Encounter for screening examination for other mental health and behavioral disorders: Secondary | ICD-10-CM | POA: Diagnosis not present

## 2022-05-02 DIAGNOSIS — Z1331 Encounter for screening for depression: Secondary | ICD-10-CM | POA: Diagnosis not present

## 2022-05-02 DIAGNOSIS — I2510 Atherosclerotic heart disease of native coronary artery without angina pectoris: Secondary | ICD-10-CM | POA: Diagnosis not present

## 2022-05-13 DIAGNOSIS — I129 Hypertensive chronic kidney disease with stage 1 through stage 4 chronic kidney disease, or unspecified chronic kidney disease: Secondary | ICD-10-CM | POA: Diagnosis not present

## 2022-05-13 DIAGNOSIS — E559 Vitamin D deficiency, unspecified: Secondary | ICD-10-CM | POA: Diagnosis not present

## 2022-05-13 DIAGNOSIS — N1831 Chronic kidney disease, stage 3a: Secondary | ICD-10-CM | POA: Diagnosis not present

## 2022-05-13 DIAGNOSIS — E1122 Type 2 diabetes mellitus with diabetic chronic kidney disease: Secondary | ICD-10-CM | POA: Diagnosis not present

## 2022-05-27 ENCOUNTER — Other Ambulatory Visit (HOSPITAL_BASED_OUTPATIENT_CLINIC_OR_DEPARTMENT_OTHER): Payer: Self-pay

## 2022-05-27 MED ORDER — COMIRNATY 30 MCG/0.3ML IM SUSY
PREFILLED_SYRINGE | INTRAMUSCULAR | 0 refills | Status: DC
  Filled 2022-05-27: qty 0.3, 1d supply, fill #0

## 2022-05-27 MED ORDER — INFLUENZA VAC A&B SA ADJ QUAD 0.5 ML IM PRSY
PREFILLED_SYRINGE | INTRAMUSCULAR | 0 refills | Status: DC
  Filled 2022-05-27: qty 0.5, 1d supply, fill #0

## 2022-05-30 DIAGNOSIS — H5213 Myopia, bilateral: Secondary | ICD-10-CM | POA: Diagnosis not present

## 2022-05-30 DIAGNOSIS — H5340 Unspecified visual field defects: Secondary | ICD-10-CM | POA: Diagnosis not present

## 2022-05-30 DIAGNOSIS — E113293 Type 2 diabetes mellitus with mild nonproliferative diabetic retinopathy without macular edema, bilateral: Secondary | ICD-10-CM | POA: Diagnosis not present

## 2022-05-30 DIAGNOSIS — H52223 Regular astigmatism, bilateral: Secondary | ICD-10-CM | POA: Diagnosis not present

## 2022-05-30 DIAGNOSIS — H59812 Chorioretinal scars after surgery for detachment, left eye: Secondary | ICD-10-CM | POA: Diagnosis not present

## 2022-05-30 DIAGNOSIS — H53143 Visual discomfort, bilateral: Secondary | ICD-10-CM | POA: Diagnosis not present

## 2022-05-30 DIAGNOSIS — H40013 Open angle with borderline findings, low risk, bilateral: Secondary | ICD-10-CM | POA: Diagnosis not present

## 2022-05-31 ENCOUNTER — Ambulatory Visit: Payer: PPO | Admitting: Podiatry

## 2022-05-31 ENCOUNTER — Encounter: Payer: Self-pay | Admitting: Podiatry

## 2022-05-31 DIAGNOSIS — B351 Tinea unguium: Secondary | ICD-10-CM

## 2022-05-31 DIAGNOSIS — N183 Chronic kidney disease, stage 3 unspecified: Secondary | ICD-10-CM

## 2022-05-31 DIAGNOSIS — E1122 Type 2 diabetes mellitus with diabetic chronic kidney disease: Secondary | ICD-10-CM | POA: Diagnosis not present

## 2022-05-31 DIAGNOSIS — Z794 Long term (current) use of insulin: Secondary | ICD-10-CM | POA: Diagnosis not present

## 2022-05-31 DIAGNOSIS — M79675 Pain in left toe(s): Secondary | ICD-10-CM | POA: Diagnosis not present

## 2022-05-31 DIAGNOSIS — N179 Acute kidney failure, unspecified: Secondary | ICD-10-CM

## 2022-05-31 DIAGNOSIS — M79674 Pain in right toe(s): Secondary | ICD-10-CM | POA: Diagnosis not present

## 2022-05-31 NOTE — Progress Notes (Signed)
This patient returns to my office for at risk foot care.  This patient requires this care by a professional since this patient will be at risk due to having chronic kidney disease and diabetes.  This patient is unable to cut nails himself since the patient cannot reach his nails.These nails are painful walking and wearing shoes.  This patient presents for at risk foot care today.  General Appearance  Alert, conversant and in no acute stress.  Vascular  Dorsalis pedis  are  weakly palpable  bilaterally. Posterior tibial pulses are absent  B/L. Capillary return is within normal limits  bilaterally. Temperature is within normal limits  Bilaterally.  Absent digital hair.  Neurologic  Senn-Weinstein monofilament wire test within normal left and absent LOPS right.. Muscle power within normal limits bilaterally.  Nails Thick disfigured discolored nails with subungual debris  Hallux  B/L.Marland Kitchen No evidence of bacterial infection or drainage bilaterally.  Orthopedic  No limitations of motion  feet .  No crepitus or effusions noted.  No bony pathology or digital deformities noted.  Skin  normotropic skin with no porokeratosis noted bilaterally.  No signs of infections or ulcers noted.     Onychomycosis  Pain in right toes  Pain in left toes  Consent was obtained for treatment procedures.   Mechanical debridement of nails 1-5  bilaterally performed with a nail nipper.  Filed with dremel without incident.    Return office visit      3 months                Told patient to return for periodic foot care and evaluation due to potential at risk complications.   Gardiner Barefoot DPM

## 2022-06-16 DIAGNOSIS — N1832 Chronic kidney disease, stage 3b: Secondary | ICD-10-CM | POA: Diagnosis not present

## 2022-06-16 DIAGNOSIS — Z91148 Patient's other noncompliance with medication regimen for other reason: Secondary | ICD-10-CM | POA: Diagnosis not present

## 2022-06-16 DIAGNOSIS — I129 Hypertensive chronic kidney disease with stage 1 through stage 4 chronic kidney disease, or unspecified chronic kidney disease: Secondary | ICD-10-CM | POA: Diagnosis not present

## 2022-06-16 DIAGNOSIS — E1159 Type 2 diabetes mellitus with other circulatory complications: Secondary | ICD-10-CM | POA: Diagnosis not present

## 2022-06-16 DIAGNOSIS — Z794 Long term (current) use of insulin: Secondary | ICD-10-CM | POA: Diagnosis not present

## 2022-06-16 DIAGNOSIS — I160 Hypertensive urgency: Secondary | ICD-10-CM | POA: Diagnosis not present

## 2022-06-16 DIAGNOSIS — K219 Gastro-esophageal reflux disease without esophagitis: Secondary | ICD-10-CM | POA: Diagnosis not present

## 2022-07-06 DIAGNOSIS — I739 Peripheral vascular disease, unspecified: Secondary | ICD-10-CM | POA: Diagnosis not present

## 2022-07-06 DIAGNOSIS — Z794 Long term (current) use of insulin: Secondary | ICD-10-CM | POA: Diagnosis not present

## 2022-07-06 DIAGNOSIS — E162 Hypoglycemia, unspecified: Secondary | ICD-10-CM | POA: Diagnosis not present

## 2022-07-06 DIAGNOSIS — N1831 Chronic kidney disease, stage 3a: Secondary | ICD-10-CM | POA: Diagnosis not present

## 2022-07-06 DIAGNOSIS — R413 Other amnesia: Secondary | ICD-10-CM | POA: Diagnosis not present

## 2022-07-06 DIAGNOSIS — E1159 Type 2 diabetes mellitus with other circulatory complications: Secondary | ICD-10-CM | POA: Diagnosis not present

## 2022-07-06 DIAGNOSIS — E785 Hyperlipidemia, unspecified: Secondary | ICD-10-CM | POA: Diagnosis not present

## 2022-07-06 DIAGNOSIS — I160 Hypertensive urgency: Secondary | ICD-10-CM | POA: Diagnosis not present

## 2022-07-06 DIAGNOSIS — I6389 Other cerebral infarction: Secondary | ICD-10-CM | POA: Diagnosis not present

## 2022-07-06 DIAGNOSIS — I2510 Atherosclerotic heart disease of native coronary artery without angina pectoris: Secondary | ICD-10-CM | POA: Diagnosis not present

## 2022-07-06 DIAGNOSIS — C61 Malignant neoplasm of prostate: Secondary | ICD-10-CM | POA: Diagnosis not present

## 2022-07-06 DIAGNOSIS — I129 Hypertensive chronic kidney disease with stage 1 through stage 4 chronic kidney disease, or unspecified chronic kidney disease: Secondary | ICD-10-CM | POA: Diagnosis not present

## 2022-07-13 DIAGNOSIS — N1831 Chronic kidney disease, stage 3a: Secondary | ICD-10-CM | POA: Diagnosis not present

## 2022-07-13 DIAGNOSIS — I2510 Atherosclerotic heart disease of native coronary artery without angina pectoris: Secondary | ICD-10-CM | POA: Diagnosis not present

## 2022-07-13 DIAGNOSIS — Z794 Long term (current) use of insulin: Secondary | ICD-10-CM | POA: Diagnosis not present

## 2022-07-13 DIAGNOSIS — I129 Hypertensive chronic kidney disease with stage 1 through stage 4 chronic kidney disease, or unspecified chronic kidney disease: Secondary | ICD-10-CM | POA: Diagnosis not present

## 2022-07-13 DIAGNOSIS — E1159 Type 2 diabetes mellitus with other circulatory complications: Secondary | ICD-10-CM | POA: Diagnosis not present

## 2022-08-31 DIAGNOSIS — H26491 Other secondary cataract, right eye: Secondary | ICD-10-CM | POA: Diagnosis not present

## 2022-08-31 DIAGNOSIS — H5213 Myopia, bilateral: Secondary | ICD-10-CM | POA: Diagnosis not present

## 2022-08-31 DIAGNOSIS — H52223 Regular astigmatism, bilateral: Secondary | ICD-10-CM | POA: Diagnosis not present

## 2022-09-06 ENCOUNTER — Ambulatory Visit: Payer: PPO | Admitting: Podiatry

## 2022-09-19 DIAGNOSIS — N1832 Chronic kidney disease, stage 3b: Secondary | ICD-10-CM | POA: Diagnosis not present

## 2022-09-19 DIAGNOSIS — I129 Hypertensive chronic kidney disease with stage 1 through stage 4 chronic kidney disease, or unspecified chronic kidney disease: Secondary | ICD-10-CM | POA: Diagnosis not present

## 2022-09-19 DIAGNOSIS — E1122 Type 2 diabetes mellitus with diabetic chronic kidney disease: Secondary | ICD-10-CM | POA: Diagnosis not present

## 2022-09-19 DIAGNOSIS — E559 Vitamin D deficiency, unspecified: Secondary | ICD-10-CM | POA: Diagnosis not present

## 2022-09-19 DIAGNOSIS — N1831 Chronic kidney disease, stage 3a: Secondary | ICD-10-CM | POA: Diagnosis not present

## 2022-09-22 ENCOUNTER — Encounter: Payer: Self-pay | Admitting: Podiatry

## 2022-09-27 ENCOUNTER — Other Ambulatory Visit (HOSPITAL_BASED_OUTPATIENT_CLINIC_OR_DEPARTMENT_OTHER): Payer: Self-pay

## 2022-09-27 MED ORDER — SHINGRIX 50 MCG/0.5ML IM SUSR
INTRAMUSCULAR | 0 refills | Status: DC
  Filled 2022-09-27: qty 0.5, 1d supply, fill #0

## 2022-09-28 ENCOUNTER — Emergency Department (HOSPITAL_COMMUNITY): Payer: PPO

## 2022-09-28 ENCOUNTER — Encounter (HOSPITAL_COMMUNITY): Payer: Self-pay

## 2022-09-28 ENCOUNTER — Other Ambulatory Visit: Payer: Self-pay

## 2022-09-28 ENCOUNTER — Emergency Department (HOSPITAL_COMMUNITY)
Admission: EM | Admit: 2022-09-28 | Discharge: 2022-09-28 | Disposition: A | Discharge status: Home / Self Care | Payer: PPO | Attending: Emergency Medicine | Admitting: Emergency Medicine

## 2022-09-28 DIAGNOSIS — E161 Other hypoglycemia: Secondary | ICD-10-CM | POA: Diagnosis not present

## 2022-09-28 DIAGNOSIS — I2510 Atherosclerotic heart disease of native coronary artery without angina pectoris: Secondary | ICD-10-CM | POA: Diagnosis not present

## 2022-09-28 DIAGNOSIS — Z1152 Encounter for screening for COVID-19: Secondary | ICD-10-CM | POA: Insufficient documentation

## 2022-09-28 DIAGNOSIS — E162 Hypoglycemia, unspecified: Secondary | ICD-10-CM | POA: Diagnosis not present

## 2022-09-28 DIAGNOSIS — N281 Cyst of kidney, acquired: Secondary | ICD-10-CM | POA: Insufficient documentation

## 2022-09-28 DIAGNOSIS — E11649 Type 2 diabetes mellitus with hypoglycemia without coma: Secondary | ICD-10-CM | POA: Insufficient documentation

## 2022-09-28 DIAGNOSIS — Z794 Long term (current) use of insulin: Secondary | ICD-10-CM | POA: Diagnosis not present

## 2022-09-28 DIAGNOSIS — E1122 Type 2 diabetes mellitus with diabetic chronic kidney disease: Secondary | ICD-10-CM | POA: Insufficient documentation

## 2022-09-28 DIAGNOSIS — R918 Other nonspecific abnormal finding of lung field: Secondary | ICD-10-CM | POA: Diagnosis not present

## 2022-09-28 DIAGNOSIS — R4182 Altered mental status, unspecified: Secondary | ICD-10-CM | POA: Diagnosis not present

## 2022-09-28 DIAGNOSIS — R0902 Hypoxemia: Secondary | ICD-10-CM | POA: Diagnosis not present

## 2022-09-28 DIAGNOSIS — N183 Chronic kidney disease, stage 3 unspecified: Secondary | ICD-10-CM | POA: Diagnosis not present

## 2022-09-28 DIAGNOSIS — I739 Peripheral vascular disease, unspecified: Secondary | ICD-10-CM | POA: Diagnosis not present

## 2022-09-28 DIAGNOSIS — R410 Disorientation, unspecified: Secondary | ICD-10-CM | POA: Diagnosis not present

## 2022-09-28 DIAGNOSIS — R61 Generalized hyperhidrosis: Secondary | ICD-10-CM | POA: Diagnosis not present

## 2022-09-28 DIAGNOSIS — J439 Emphysema, unspecified: Secondary | ICD-10-CM | POA: Insufficient documentation

## 2022-09-28 DIAGNOSIS — R5381 Other malaise: Secondary | ICD-10-CM | POA: Diagnosis not present

## 2022-09-28 DIAGNOSIS — R404 Transient alteration of awareness: Secondary | ICD-10-CM | POA: Diagnosis not present

## 2022-09-28 LAB — CBC WITH DIFFERENTIAL/PLATELET
Abs Immature Granulocytes: 0.04 10*3/uL (ref 0.00–0.07)
Basophils Absolute: 0 10*3/uL (ref 0.0–0.1)
Basophils Relative: 0 %
Eosinophils Absolute: 0.1 10*3/uL (ref 0.0–0.5)
Eosinophils Relative: 1 %
HCT: 46.5 % (ref 39.0–52.0)
Hemoglobin: 14.7 g/dL (ref 13.0–17.0)
Immature Granulocytes: 1 %
Lymphocytes Relative: 14 %
Lymphs Abs: 1.2 10*3/uL (ref 0.7–4.0)
MCH: 26.9 pg (ref 26.0–34.0)
MCHC: 31.6 g/dL (ref 30.0–36.0)
MCV: 85.2 fL (ref 80.0–100.0)
Monocytes Absolute: 0.5 10*3/uL (ref 0.1–1.0)
Monocytes Relative: 6 %
Neutro Abs: 6.6 10*3/uL (ref 1.7–7.7)
Neutrophils Relative %: 78 %
Platelets: 247 10*3/uL (ref 150–400)
RBC: 5.46 MIL/uL (ref 4.22–5.81)
RDW: 12.4 % (ref 11.5–15.5)
WBC: 8.4 10*3/uL (ref 4.0–10.5)
nRBC: 0 % (ref 0.0–0.2)

## 2022-09-28 LAB — URINALYSIS, ROUTINE W REFLEX MICROSCOPIC
Bacteria, UA: NONE SEEN
Bilirubin Urine: NEGATIVE
Glucose, UA: >=500 mg/dL — AB
Hgb urine dipstick: NEGATIVE
Ketones, ur: NEGATIVE mg/dL
Leukocytes,Ua: NEGATIVE
Nitrite: NEGATIVE
Protein, ur: 100 mg/dL — AB
Specific Gravity, Urine: 1.017 (ref 1.005–1.030)
pH: 6 (ref 5.0–8.0)

## 2022-09-28 LAB — BASIC METABOLIC PANEL
Anion gap: 11 (ref 5–15)
BUN: 33 mg/dL — ABNORMAL HIGH (ref 8–23)
CO2: 27 mmol/L (ref 22–32)
Calcium: 9.9 mg/dL (ref 8.9–10.3)
Chloride: 102 mmol/L (ref 98–111)
Creatinine, Ser: 1.7 mg/dL — ABNORMAL HIGH (ref 0.61–1.24)
GFR, Estimated: 40 mL/min — ABNORMAL LOW (ref >60)
Glucose, Bld: 92 mg/dL (ref 70–99)
Potassium: 3.9 mmol/L (ref 3.5–5.1)
Sodium: 140 mmol/L (ref 135–145)

## 2022-09-28 LAB — RESP PANEL BY RT-PCR (RSV, FLU A&B, COVID)  RVPGX2
Influenza A by PCR: NEGATIVE
Influenza B by PCR: NEGATIVE
Resp Syncytial Virus by PCR: NEGATIVE
SARS Coronavirus 2 by RT PCR: NEGATIVE

## 2022-09-28 LAB — CBG MONITORING, ED
Glucose-Capillary: 105 mg/dL — ABNORMAL HIGH (ref 70–99)
Glucose-Capillary: 109 mg/dL — ABNORMAL HIGH (ref 70–99)
Glucose-Capillary: 108 mg/dL — ABNORMAL HIGH (ref 70–99)
Glucose-Capillary: 209 mg/dL — ABNORMAL HIGH (ref 70–99)
Glucose-Capillary: 217 mg/dL — ABNORMAL HIGH (ref 70–99)

## 2022-09-28 MED ORDER — SODIUM CHLORIDE 0.9 % IV BOLUS
500.0000 mL | Freq: Once | INTRAVENOUS | Status: AC
  Administered 2022-09-28: 500 mL via INTRAVENOUS

## 2022-09-28 MED ORDER — AMLODIPINE BESYLATE 5 MG PO TABS
10.0000 mg | ORAL_TABLET | Freq: Once | ORAL | Status: AC
  Administered 2022-09-28: 10 mg via ORAL
  Filled 2022-09-28: qty 2

## 2022-09-28 NOTE — ED Provider Notes (Signed)
Patient care assumed from [facility], [place] at shift handoff.  Please see her note for full details  In short, 82 year old male patient presented to the emergency department due to hypoglycemia.  Patient with history of type II DM on Farxiga and insulin.  Patient takes 60 units of insulin twice daily.  Last night he states he had dirty rice with strep and sausage and felt that he did not have enough carbohydrates.  His blood sugar was 350 and he reduced his dose to 50 units to make sure he did not drop his glucose.  This morning he had slurred speech and was restless at around 9 AM.  His home glucometer would not read.  EMS noted his blood sugar to be in the 40s upon arrival.  EMS administered D10 the patient drinks of orange juice.  Blood sugar had improved.  At time of my assumption of care UA was pending.  Patient's other vitals had improved.  No acute lab or imaging findings Physical Exam  BP (!) 177/74   Pulse (!) 58   Temp 98.1 F (36.7 C) (Oral)   Resp 18   Ht 6\' 1"  (1.854 m)   Wt 84 kg   SpO2 99%   BMI 24.43 kg/m   Physical Exam  Procedures  Procedures  ED Course / MDM    Medical Decision Making Amount and/or Complexity of Data Reviewed Labs: ordered. Radiology: ordered.  Risk Prescription drug management.   UA resulted showing no signs of UTI at this time.  Patient is feeling better.  CBG w at this time is 217.  Blood pressure 177/74, heart rate 58.  Patient feels much better at this time.  Plan to discharge home.       Ronny Bacon 09/28/22 1750    Ezequiel Essex, MD 09/28/22 [phone]

## 2022-09-28 NOTE — Discharge Instructions (Addendum)
Verbal is a very low this morning.  It has been better after eating it was likely due to not eating enough carbohydrates last night.  Your blood work and imaging were all very reassuring.  Call your doctor this afternoon or first thing in the morning to see if they want to adjust your insulin dosage.

## 2022-09-28 NOTE — ED Triage Notes (Addendum)
Patient BIB [facility] from home. Patients sugar on arrival was 43. EMS gave 25G D10. CBG improved to 213. Patients wife called EMS as patient was acting strange and diaphoretic. No pain. Patient had a moon pie before arrival.

## 2022-09-28 NOTE — ED Provider Notes (Signed)
[place] EMERGENCY DEPARTMENT AT [facility] Provider Note   CSN: [id number] Arrival date & time: 09/28/22  1012     History  Chief Complaint  Patient presents with   Hypoglycemia    Xavier [name] is a 82 y.o. male.  History of diabetes type 2 he is on [place] and ambulance are insulin.  He states he takes 60 units of insulin twice daily.  Last night he states he had dirty rice with shrimp and sausage and did not think it was enough carbohydrates.  His blood sugar was 350 so he reduced his dose to 50 units to make sure he did not drop but his wife states around 9 AM this morning he was very restless and having slurred speech.  She could not get his continuous glucometer to read so she called no one sugar was in the 40s on EMS arrival.  He reports he has some orange juice with EMS and was also gave D10.  Blood sugar improved upon arrival, his wife states he is back to his baseline.  Denies recent illness, no fevers or chills, no nausea or vomiting.  No chest pain, no shortness of breath, no abdominal pain.  History of CKD stage III, PAD, preglaucoma     Hypoglycemia      Home Medications Prior to Admission medications   Medication Sig Start Date End Date Taking? Authorizing Provider  amLODipine (NORVASC) 10 MG tablet TAKE 1 TABLET BY MOUTH EVERY DAY 06/23/21   Wellington Hampshire, MD  aspirin EC 81 MG tablet Take 1 tablet (81 mg total) by mouth daily. With food 12/10/17   Roxan Hockey, MD  atorvastatin (LIPITOR) 80 MG tablet Take 1 tablet (80 mg total) by mouth daily. In evening 12/10/17   Roxan Hockey, MD  carvedilol (COREG) 25 MG tablet Take 25 mg by mouth 2 (two) times daily. 07/13/22   [provider]  Continuous Blood Gluc Sensor (FREESTYLE LIBRE 2 SENSOR) MISC Change every 14 days to monitor blood glucose continually  DX: E11.59    [provider]  COVID-19 mRNA vaccine 2023-2024 (COMIRNATY) syringe Inject into the muscle. 05/27/22   Carlyle Basques, MD  DUREZOL 0.05 % EMUL Place 1 drop into the right eye 4 (four) times daily. 09/25/20   [provider]  FARXIGA 10 MG TABS tablet Take 10 mg by mouth daily. 12/22/20   [provider]  glucose blood (ONETOUCH VERIO) test strip USE TO TEST BLOOD SUGAR 3 TIMES DAILY OR AS DIRECTED. E11.65 (HMO PLAN) 11/06/17   [provider]  HUMULIN R U-500 KWIKPEN 500 UNIT/ML KwikPen Inject 50 Units into the skin 2 (two) times daily. 09/19/22   [provider]  influenza vaccine adjuvanted (FLUAD) 0.5 ML injection Inject into the muscle. 05/27/22   Carlyle Basques, MD  Insulin Pen Needle (B-D ULTRAFINE III SHORT PEN) 31G X 8 MM MISC use 3 needle as directed with each meal- DX E11.59 05/16/11   [provider]  losartan (COZAAR) 25 MG tablet Take 25 mg by mouth daily. 09/19/22   [provider]  omeprazole (PRILOSEC) 40 MG capsule Take 40 mg by mouth every morning. 09/12/22   [provider]  OneTouch Delica Lancets 99991111 MISC Use to test sugar 3 times daily or as instructed. E11.65 01/12/17   [provider]  PRALUENT 150 MG/ML SOAJ DOSE EVEY 2 WEEKS AS DIRECTED. 11/28/19   [provider]  triamterene-hydrochlorothiazide (DYAZIDE) 37.5-25 MG capsule Take 1 capsule  by mouth daily.    [provider]  Zoster Vaccine Adjuvanted [facility]) injection Inject into the muscle. 03/24/22   Carlyle Basques, MD  Zoster Vaccine Adjuvanted [facility]) injection Inject into the muscle. 09/27/22         Allergies    Benazepril and Ezetimibe    Review of Systems   Review of Systems  Physical Exam Updated Vital Signs BP (!) 226/79 (BP Location: Left Arm) Comment: RN informed  Pulse 65   Temp 98.1 F (36.7 C) (Oral)   Resp 18   Ht 6\' 1"  (1.854 m)   Wt 84 kg   SpO2 99%   BMI 24.43 kg/m  Physical Exam Vitals and nursing note reviewed.  Constitutional:      General: He is not in acute distress.    Appearance: He is well-developed.   HENT:     Head: Normocephalic and atraumatic.     Nose: Nose normal.     Mouth/Throat:     Mouth: Mucous membranes are moist.  Eyes:     Extraocular Movements: Extraocular movements intact.     Conjunctiva/sclera: Conjunctivae normal.     Pupils: Pupils are equal, round, and reactive to light.  Cardiovascular:     Rate and Rhythm: Normal rate and regular rhythm.     Heart sounds: No murmur heard. Pulmonary:     Effort: Pulmonary effort is normal. No respiratory distress.     Breath sounds: Normal breath sounds.  Abdominal:     Palpations: Abdomen is soft.     Tenderness: There is no abdominal tenderness.  Musculoskeletal:        General: No swelling or tenderness. Normal range of motion.     Cervical back: Neck supple.  Skin:    General: Skin is warm and dry.     Capillary Refill: Capillary refill takes less than 2 seconds.  Neurological:     General: No focal deficit present.     Mental Status: He is alert and oriented to person, place, and time.  Psychiatric:        Mood and Affect: Mood normal.     ED Results / Procedures / Treatments   Labs (all labs ordered are listed, but only abnormal results are displayed) Labs Reviewed  BASIC METABOLIC PANEL - Abnormal; Notable for the following components:      Result Value   BUN 33 (*)    Creatinine, Ser 1.70 (*)    GFR, Estimated 40 (*)    All other components within normal limits  CBG MONITORING, ED - Abnormal; Notable for the following components:   Glucose-Capillary 105 (*)    All other components within normal limits  CBG MONITORING, ED - Abnormal; Notable for the following components:   Glucose-Capillary 109 (*)    All other components within normal limits  CBG MONITORING, ED - Abnormal; Notable for the following components:   Glucose-Capillary 108 (*)    All other components within normal limits  CBG MONITORING, ED - Abnormal; Notable for the following components:   Glucose-Capillary 209 (*)    All other  components within normal limits  CBG MONITORING, ED - Abnormal; Notable for the following components:   Glucose-Capillary 217 (*)    All other components within normal limits  RESP PANEL BY RT-PCR (RSV, FLU A&B, COVID)  RVPGX2  CBC WITH DIFFERENTIAL/PLATELET  URINALYSIS, ROUTINE W REFLEX MICROSCOPIC    EKG None  Radiology CT Chest Wo Contrast  Result Date: 09/28/2022 CLINICAL DATA:  Abnormal  chest x-ray, hypoglycemia. EXAM: CT CHEST WITHOUT CONTRAST TECHNIQUE: Multidetector CT imaging of the chest was performed following the standard protocol without IV contrast. RADIATION DOSE REDUCTION: This exam was performed according to the departmental dose-optimization program which includes automated exposure control, adjustment of the mA and/or kV according to patient size and/or use of iterative reconstruction technique. COMPARISON:  Same day chest radiograph, CT chest 12/09/2017 FINDINGS: Cardiovascular: The heart size is normal. There is no pericardial effusion. There are extensive coronary artery calcifications as well as aortic valve and mitral annular calcifications. There is calcified atherosclerotic plaque in the thoracic aorta. Mediastinum/Nodes: The thyroid is unremarkable. The esophagus is grossly unremarkable. There is no mediastinal adenopathy. There is no axillary adenopathy. There is no bulky hilar adenopathy, within the confines of noncontrast technique. Lungs/Pleura: The trachea and central airways are patent. There is no focal consolidation or pulmonary edema. There is no pleural effusion or pneumothorax. Prominent bullae in the lung apices, left more than right, are unchanged since 2019. A few small probable perifissural lymph nodes are unchanged (on the right on 5-78, 84 and on the left on 5-93). There is no suspicious nodule. Previously described right infrahilar opacity is likely artifactual. Upper Abdomen: Bilateral renal cysts are noted, similar to 2019 requiring no specific imaging  follow-up. The imaged portions of the upper abdominal viscera are otherwise unremarkable. Musculoskeletal: There is no acute osseous abnormality or suspicious osseous lesion. IMPRESSION: 1. No acute or suspicious finding in the chest. The finding on the same-day chest radiograph was likely artifactual. 2. Prominent apical bullae are unchanged. 3. Coronary artery calcifications, aortic valve, and mitral annular calcifications. Electronically Signed   By: Valetta Mole M.D.   On: 09/28/2022 14:22   DG Chest 1 View  Result Date: 09/28/2022 CLINICAL DATA:  Malaise. Initial hypoglycemia. Altered mental status and diaphoresis. EXAM: CHEST  1 VIEW COMPARISON:  12/09/2017 FINDINGS: Ill-defined right infrahilar density appears accentuated compared to the prior exam, cannot exclude pneumonia or infrahilar pulmonary nodule. Lungs appear otherwise clear. Cardiac and mediastinal margins appear normal. No blunting of the costophrenic angles. No significant bony abnormality observed. IMPRESSION: 1. Ill-defined right infrahilar density appears accentuated compared to the prior exam, cannot exclude pneumonia or infrahilar pulmonary nodule. Chest CT is recommended for further characterization. 2. Otherwise unremarkable. Electronically Signed   By: Van Clines M.D.   On: 09/28/2022 11:44    Procedures Procedures    Medications Ordered in ED Medications  amLODipine (NORVASC) tablet 10 mg (has no administration in time range)  sodium chloride 0.9 % bolus 500 mL (0 mLs Intravenous Stopped 09/28/22 1151)    ED Course/ Medical Decision Making/ A&P                             Medical Decision Making This patient presents to the ED for concern of low blood sugar, this involves an extensive number of treatment options, and is a complaint that carries with it a high risk of complications and morbidity.  The differential diagnosis includes sepsis, hypoglycemia, AKI, other   Co morbidities that complicate the patient  evaluation  Type 2 diabetes   Additional history obtained:  Additional history obtained from EMR External records from outside source obtained and reviewed including outpatient visits   Lab Tests:  I Ordered, and personally interpreted labs.  The pertinent results include: Patient initially had blood sugars in 100s but continue to eat, last 2 blood sugars 209, 217 UA  pending   Imaging Studies ordered:  I ordered imaging studies including x-ray showed possible infiltrate, subsequent CT normal per radiology interpretation    Problem List / ED Course / Critical interventions / Medication management  Comes with low blood sugar and hypothermia.  Resolved with eating and drinking, has not required any dextrose.  Patient is feeling much better, blood pressure is elevated due to likely not taking medicines today but he is completely asymptomatic, temperature is normal.  I have reviewed the patients home medicines and have made adjustments as needed    Test / Admission - Considered:  Admission but patient blood sugar remained stable without any intervention aside from feeding patient.  No need for admission at this time, labs are very reassuring    Amount and/or Complexity of Data Reviewed Labs: ordered. Radiology: ordered.   Pt noted to have very high blood pressure today. They have no symptoms, including no chest pain, SOB, vision change, numbness/tingling/weakness. They were made aware of the value and the need to follow up with primary care in 24-48 hours, and to return immediately if they develop any symptoms.  To him not taking any medications today for his blood pressure he is going to come when he gets home.  Awaiting urinalysis, will not to oncoming team pending UA results         Final Clinical Impression(s) / ED Diagnoses Final diagnoses:  Hypoglycemia    Rx / DC Orders ED Discharge Orders     None         Gwenevere Abbot, PA-C 09/28/22 [place], Hatch, DO 09/29/22 [phone]

## 2022-10-04 ENCOUNTER — Telehealth: Payer: Self-pay

## 2022-10-04 NOTE — Telephone Encounter (Signed)
        Patient  visited [place] on 3/20   Telephone encounter attempt :  1st  A HIPAA compliant voice message was left requesting a return call.  Instructed patient to call back .    [facility] [place] [phone] [address], [place], [place] [address] Phone: [phone] Email: [email or web address]

## 2022-10-05 ENCOUNTER — Telehealth: Payer: Self-pay

## 2022-10-05 NOTE — Telephone Encounter (Signed)
        Patient  visited [place] on 3/20     Telephone encounter attempt :  1st  A HIPAA compliant voice message was left requesting a return call.  Instructed patient to call back.    [facility] [place] [phone] [address], [place],  [address] Phone: [phone] Email: [email or web address]

## 2022-10-11 DIAGNOSIS — E785 Hyperlipidemia, unspecified: Secondary | ICD-10-CM | POA: Diagnosis not present

## 2022-10-11 DIAGNOSIS — H40013 Open angle with borderline findings, low risk, bilateral: Secondary | ICD-10-CM | POA: Diagnosis not present

## 2022-10-11 DIAGNOSIS — H5340 Unspecified visual field defects: Secondary | ICD-10-CM | POA: Diagnosis not present

## 2022-10-11 DIAGNOSIS — I10 Essential (primary) hypertension: Secondary | ICD-10-CM | POA: Diagnosis not present

## 2022-10-11 DIAGNOSIS — Z794 Long term (current) use of insulin: Secondary | ICD-10-CM | POA: Diagnosis not present

## 2022-10-11 DIAGNOSIS — I6389 Other cerebral infarction: Secondary | ICD-10-CM | POA: Diagnosis not present

## 2022-10-11 DIAGNOSIS — C61 Malignant neoplasm of prostate: Secondary | ICD-10-CM | POA: Diagnosis not present

## 2022-10-11 DIAGNOSIS — R413 Other amnesia: Secondary | ICD-10-CM | POA: Diagnosis not present

## 2022-10-11 DIAGNOSIS — I739 Peripheral vascular disease, unspecified: Secondary | ICD-10-CM | POA: Diagnosis not present

## 2022-10-11 DIAGNOSIS — E1159 Type 2 diabetes mellitus with other circulatory complications: Secondary | ICD-10-CM | POA: Diagnosis not present

## 2022-10-11 DIAGNOSIS — I2510 Atherosclerotic heart disease of native coronary artery without angina pectoris: Secondary | ICD-10-CM | POA: Diagnosis not present

## 2022-10-11 DIAGNOSIS — H40053 Ocular hypertension, bilateral: Secondary | ICD-10-CM | POA: Diagnosis not present

## 2022-10-11 DIAGNOSIS — K219 Gastro-esophageal reflux disease without esophagitis: Secondary | ICD-10-CM | POA: Diagnosis not present

## 2022-10-11 DIAGNOSIS — N1831 Chronic kidney disease, stage 3a: Secondary | ICD-10-CM | POA: Diagnosis not present

## 2022-10-11 DIAGNOSIS — I129 Hypertensive chronic kidney disease with stage 1 through stage 4 chronic kidney disease, or unspecified chronic kidney disease: Secondary | ICD-10-CM | POA: Diagnosis not present

## 2022-10-17 ENCOUNTER — Encounter: Payer: Self-pay | Admitting: Podiatry

## 2022-10-17 ENCOUNTER — Ambulatory Visit: Payer: PPO | Admitting: Podiatry

## 2022-10-17 ENCOUNTER — Ambulatory Visit (INDEPENDENT_AMBULATORY_CARE_PROVIDER_SITE_OTHER): Payer: PPO | Admitting: Podiatry

## 2022-10-17 VITALS — BP 219/84

## 2022-10-17 DIAGNOSIS — E1122 Type 2 diabetes mellitus with diabetic chronic kidney disease: Secondary | ICD-10-CM | POA: Diagnosis not present

## 2022-10-17 DIAGNOSIS — Z794 Long term (current) use of insulin: Secondary | ICD-10-CM | POA: Diagnosis not present

## 2022-10-17 DIAGNOSIS — M79675 Pain in left toe(s): Secondary | ICD-10-CM | POA: Diagnosis not present

## 2022-10-17 DIAGNOSIS — N183 Chronic kidney disease, stage 3 unspecified: Secondary | ICD-10-CM | POA: Diagnosis not present

## 2022-10-17 DIAGNOSIS — B351 Tinea unguium: Secondary | ICD-10-CM

## 2022-10-17 DIAGNOSIS — M79674 Pain in right toe(s): Secondary | ICD-10-CM

## 2022-10-17 NOTE — Progress Notes (Signed)
This patient returns to my office for at risk foot care.  This patient requires this care by a professional since this patient will be at risk due to having chronic kidney disease and diabetes.  This patient is unable to cut nails himself since the patient cannot reach his nails.These nails are painful walking and wearing shoes.  This patient presents for at risk foot care today.  General Appearance  Alert, conversant and in no acute stress.  Vascular  Dorsalis pedis  are  weakly palpable  bilaterally. Posterior tibial pulses are absent  B/L. Capillary return is within normal limits  bilaterally. Temperature is within normal limits  Bilaterally.  Absent digital hair.  Neurologic  Senn-Weinstein monofilament wire test within normal left and absent LOPS right.. Muscle power within normal limits bilaterally.  Nails Thick disfigured discolored nails with subungual debris  Hallux  B/L.. No evidence of bacterial infection or drainage bilaterally.  Orthopedic  No limitations of motion  feet .  No crepitus or effusions noted.  No bony pathology or digital deformities noted.  Skin  normotropic skin with no porokeratosis noted bilaterally.  No signs of infections or ulcers noted.     Onychomycosis  Pain in right toes  Pain in left toes  Consent was obtained for treatment procedures.   Mechanical debridement of nails 1-5  bilaterally performed with a nail nipper.  Filed with dremel without incident.    Return office visit      3 months                Told patient to return for periodic foot care and evaluation due to potential at risk complications.   Richelle Glick DPM  

## 2022-11-28 ENCOUNTER — Ambulatory Visit (HOSPITAL_COMMUNITY)
Admission: RE | Admit: 2022-11-28 | Discharge: 2022-11-28 | Disposition: A | Discharge status: Home / Self Care | Payer: PPO | Source: Ambulatory Visit | Attending: Adult Health | Admitting: Adult Health

## 2022-11-28 ENCOUNTER — Other Ambulatory Visit (HOSPITAL_COMMUNITY): Payer: Self-pay | Admitting: Adult Health

## 2022-11-28 DIAGNOSIS — M21372 Foot drop, left foot: Secondary | ICD-10-CM

## 2022-11-28 DIAGNOSIS — R200 Anesthesia of skin: Secondary | ICD-10-CM

## 2022-11-28 DIAGNOSIS — M4316 Spondylolisthesis, lumbar region: Secondary | ICD-10-CM | POA: Diagnosis not present

## 2022-11-28 DIAGNOSIS — M5136 Other intervertebral disc degeneration, lumbar region: Secondary | ICD-10-CM | POA: Insufficient documentation

## 2022-11-28 DIAGNOSIS — M47816 Spondylosis without myelopathy or radiculopathy, lumbar region: Secondary | ICD-10-CM | POA: Diagnosis not present

## 2022-11-28 DIAGNOSIS — E1159 Type 2 diabetes mellitus with other circulatory complications: Secondary | ICD-10-CM | POA: Diagnosis not present

## 2022-12-09 ENCOUNTER — Ambulatory Visit (HOSPITAL_COMMUNITY)
Admission: EM | Admit: 2022-12-09 | Discharge: 2022-12-09 | Disposition: A | Discharge status: Home / Self Care | Payer: PPO

## 2022-12-09 ENCOUNTER — Encounter (HOSPITAL_COMMUNITY): Payer: Self-pay

## 2022-12-09 DIAGNOSIS — R200 Anesthesia of skin: Secondary | ICD-10-CM | POA: Diagnosis not present

## 2022-12-09 DIAGNOSIS — I10 Essential (primary) hypertension: Secondary | ICD-10-CM | POA: Diagnosis not present

## 2022-12-09 NOTE — Discharge Instructions (Signed)
I am not sure what is causing your symptoms.  I think you need to have additional testing such as nerve conduction studies.  Please call and schedule appoint with neurology.  If anything changes or worsens return for reevaluation.  Your blood pressure is elevated.  Take your medication when you get home.  Monitor your blood pressure at home.  If you develop any chest pain, shortness of breath, headache, vision change, dizziness in setting of high blood pressure you need to go to the emergency room.
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
## 2022-12-09 NOTE — ED Triage Notes (Signed)
Left foot pain and stiffness onset last month. No known injuries or falls. Patient has a history of neuropathy and the foot would lock up/go numb at night but would get better in the morning. States this is getting worse.   Set to see Ortho in August.

## 2022-12-12 ENCOUNTER — Encounter: Payer: Self-pay | Admitting: Neurology

## 2022-12-16 DIAGNOSIS — E1159 Type 2 diabetes mellitus with other circulatory complications: Secondary | ICD-10-CM | POA: Diagnosis not present

## 2022-12-16 DIAGNOSIS — M21372 Foot drop, left foot: Secondary | ICD-10-CM | POA: Diagnosis not present

## 2022-12-16 DIAGNOSIS — I739 Peripheral vascular disease, unspecified: Secondary | ICD-10-CM | POA: Diagnosis not present

## 2022-12-16 DIAGNOSIS — Z794 Long term (current) use of insulin: Secondary | ICD-10-CM | POA: Diagnosis not present

## 2022-12-16 DIAGNOSIS — I6389 Other cerebral infarction: Secondary | ICD-10-CM | POA: Diagnosis not present

## 2022-12-16 DIAGNOSIS — I129 Hypertensive chronic kidney disease with stage 1 through stage 4 chronic kidney disease, or unspecified chronic kidney disease: Secondary | ICD-10-CM | POA: Diagnosis not present

## 2022-12-16 DIAGNOSIS — N1831 Chronic kidney disease, stage 3a: Secondary | ICD-10-CM | POA: Diagnosis not present

## 2022-12-16 DIAGNOSIS — K219 Gastro-esophageal reflux disease without esophagitis: Secondary | ICD-10-CM | POA: Diagnosis not present

## 2022-12-16 DIAGNOSIS — R200 Anesthesia of skin: Secondary | ICD-10-CM | POA: Diagnosis not present

## 2022-12-16 DIAGNOSIS — E785 Hyperlipidemia, unspecified: Secondary | ICD-10-CM | POA: Diagnosis not present

## 2022-12-16 DIAGNOSIS — I2510 Atherosclerotic heart disease of native coronary artery without angina pectoris: Secondary | ICD-10-CM | POA: Diagnosis not present

## 2022-12-16 DIAGNOSIS — C61 Malignant neoplasm of prostate: Secondary | ICD-10-CM | POA: Diagnosis not present

## 2022-12-29 ENCOUNTER — Ambulatory Visit: Payer: PPO

## 2022-12-29 ENCOUNTER — Ambulatory Visit: Payer: PPO | Attending: Internal Medicine

## 2022-12-29 ENCOUNTER — Other Ambulatory Visit: Payer: Self-pay

## 2022-12-29 DIAGNOSIS — R262 Difficulty in walking, not elsewhere classified: Secondary | ICD-10-CM | POA: Diagnosis not present

## 2022-12-29 DIAGNOSIS — M6281 Muscle weakness (generalized): Secondary | ICD-10-CM | POA: Insufficient documentation

## 2022-12-29 DIAGNOSIS — M21372 Foot drop, left foot: Secondary | ICD-10-CM | POA: Insufficient documentation
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
## 2023-01-11 NOTE — Progress Notes (Signed)
Initial neurology clinic note  Reason for Evaluation: Consultation requested by Xavier Ran, MD for an opinion regarding neuropathy. My final recommendations will be communicated back to the requesting physician by way of shared medical record or letter to requesting physician via [place] mail.  HPI: This is [name], a 82 y.o. right-handed male with a medical history of poorly controlled DM, DDD of lumbar spine, HTN, HLD, prostate cancer, CAD, PAD, vit D deficiency, OA, former smoker who presents to neurology clinic with the chief complaint of left foot numbness. The patient is accompanied by wife.  Patient first noticed symptoms in 10/2022. He noticed changes in the left foot. He feels like there is 2 parts to his foot. He denies significant pain, but endorses numbness. He does not have significant back pain unless he walks a lot. He may increase in foot symptoms if he is walking a lot. He does get some relief if he sits. He has some symptoms in his right foot that he has difficulty describing, but it gets better when he standing. He has difficulty with balance. He had one fall this year, in 12/2022, when he fell in the house. He stumbles over his left foot. He denies leg crossing, trauma to left leg, or frequent pressure on the outside of his left leg. His weight has not significantly changed.  He denies significant cramps or twitching. He used to have much worse cramps, many years ago.  Patient states he has had "neuropathy" since 2016. He describes this as foot feeling asleep. It would disappear if he got up and moved around. Since 2016, the left foot now has constant symptoms that does not disappear.  Patient has tried gabapentin, but this did not help, so it was discontinued. He has an AFO on order from [facility] for foot drop. He has not done PT.  Patient has previously taken vitamin D, but is not currently. Patient endorses missing medications occasionally, like when he was on  vacation.  He does not report any constitutional symptoms like fever, night sweats, anorexia or unintentional weight loss.  EtOH use: very rare  Restrictive diet? No Family history of neuropathy/myopathy/neurologic disease? No  He has never had EMG.   MEDICATIONS:  Outpatient Encounter Medications as of 01/19/2023  Medication Sig   amLODipine (NORVASC) 10 MG tablet TAKE 1 TABLET BY MOUTH EVERY DAY   aspirin EC 81 MG tablet Take 1 tablet (81 mg total) by mouth daily. With food   atorvastatin (LIPITOR) 80 MG tablet Take 1 tablet (80 mg total) by mouth daily. In evening   carvedilol (COREG) 25 MG tablet Take 25 mg by mouth 2 (two) times daily.   Continuous Blood Gluc Sensor (FREESTYLE LIBRE 2 SENSOR) MISC Change every 14 days to monitor blood glucose continually  DX: E11.59   FARXIGA 10 MG TABS tablet Take 10 mg by mouth daily.   glucose blood (ONETOUCH VERIO) test strip USE TO TEST BLOOD SUGAR 3 TIMES DAILY OR AS DIRECTED. E11.65 (HMO PLAN)   Insulin Pen Needle (B-D ULTRAFINE III SHORT PEN) 31G X 8 MM MISC use 3 needle as directed with each meal- DX E11.59   OneTouch Delica Lancets 33G MISC Use to test sugar 3 times daily or as instructed. E11.65   COVID-19 mRNA vaccine 2023-2024 (COMIRNATY) syringe Inject into the muscle. (Patient not taking: Reported on 01/19/2023)   DUREZOL 0.05 % EMUL Place 1 drop into the right eye 4 (four) times daily. (Patient not taking: Reported on  01/19/2023)   influenza vaccine adjuvanted (FLUAD) 0.5 ML injection Inject into the muscle. (Patient not taking: Reported on 01/19/2023)   losartan (COZAAR) 25 MG tablet Take 25 mg by mouth daily. (Patient not taking: Reported on 01/19/2023)   omeprazole (PRILOSEC) 40 MG capsule Take 40 mg by mouth every morning. (Patient not taking: Reported on 01/19/2023)   PRALUENT 150 MG/ML SOAJ DOSE EVEY 2 WEEKS AS DIRECTED. (Patient not taking: Reported on 01/19/2023)   TOUJEO SOLOSTAR 300 UNIT/ML Solostar Pen SMARTSIG:60 Unit(s) SUB-Q  Daily (Patient not taking: Reported on 01/19/2023)   triamterene-hydrochlorothiazide (DYAZIDE) 37.5-25 MG capsule Take 1 capsule by mouth daily. (Patient not taking: Reported on 01/19/2023)   Zoster Vaccine Adjuvanted [facility]) injection Inject into the muscle. (Patient not taking: Reported on 01/19/2023)   Zoster Vaccine Adjuvanted [facility]) injection Inject into the muscle. (Patient not taking: Reported on 01/19/2023)   Facility-Administered Encounter Medications as of 01/19/2023  Medication   0.9 %  sodium chloride infusion    PAST MEDICAL HISTORY: Past Medical History:  Diagnosis Date   Congenital ureterocele    Diabetes mellitus without complication (HCC)    Hypertension    Prostate cancer (HCC)     PAST SURGICAL HISTORY: Past Surgical History:  Procedure Laterality Date   COLONOSCOPY W/ POLYPECTOMY     EYE SURGERY      ALLERGIES: Allergies  Allergen Reactions   Benazepril Other (See Comments)   Ezetimibe Other (See Comments)   Losartan Potassium Other (See Comments)    FAMILY HISTORY: Family History  Problem Relation Age of Onset   Colon cancer Neg Hx     SOCIAL HISTORY: Social History   Tobacco Use   Smoking status: Former    Current packs/day: 0.00    Types: Cigarettes    Quit date: 12/10/1978    Years since quitting: 44.1   Smokeless tobacco: Never  Vaping Use   Vaping status: Never Used  Substance Use Topics   Alcohol use: No   Drug use: No   Social History   Social History Narrative   Not on file     OBJECTIVE: PHYSICAL EXAM: BP (!) 142/60   Pulse 67   Ht 6\' 2"  (1.88 m)   Wt 183 lb 9.6 oz (83.3 kg)   BMI 23.57 kg/m   General: General appearance: Awake and alert. No distress. Cooperative with exam.  Skin: No obvious rash or jaundice. HEENT: Atraumatic. Anicteric. Lungs: Non-labored breathing on room air  Extremities: No edema. Psych: Affect appropriate.  Neurological: Mental Status: Alert. Speech fluent. No pseudobulbar  affect Cranial Nerves: CNII: No RAPD. Visual fields grossly intact. CNIII, IV, VI: Irregular pupil on left, not reactive (due to retinal detachment and surgery per patient). No nystagmus. EOMI. CN V: Facial sensation intact bilaterally to fine touch. CN VII: Facial muscles symmetric and strong. No ptosis at rest. CN VIII: Hearing grossly intact bilaterally. CN IX: No hypophonia. CN X: Palate elevates symmetrically. CN XI: Full strength shoulder shrug bilaterally. CN XII: Tongue protrusion full and midline. No atrophy or fasciculations. No significant dysarthria Motor: Tone is normal. No fasciculations in extremities. No atrophy.  Individual muscle group testing (MRC grade out of 5):  Movement     Neck flexion 5    Neck extension 5     Right Left   Shoulder abduction 5 5   Shoulder adduction 5 5   Elbow flexion 5 5   Elbow extension 5 5   Finger abduction - FDI 5 5   Finger  abduction - ADM 5 5   Finger extension 5 5   Finger distal flexion - 2/3 5 5    Finger distal flexion - 4/5 5 5    Thumb flexion - FPL 5 5   Thumb abduction - APB 5 5    Hip flexion 5 5   Hip extension 5 5   Hip adduction 5 5   Hip abduction 5 5   Knee extension 5 5   Knee flexion 5 5   Dorsiflexion 5 4   Plantarflexion 5 5   Inversion 5 5   Eversion 5 4   Great toe extension 4+ 3   Great toe flexion 4+ 5-     Reflexes:  Right Left   Bicep 1+ 1+   Tricep 1+ 1+   BrRad 1+ 1+   Knee Trace Trace   Ankle 0 0    Pathological Reflexes: Babinski: flexor response bilaterally Hoffman: absent bilaterally Troemner: absent bilaterally Sensation: Pinprick: Intact in bilateral upper extremities. Diminished in right lower limb to mid calf, absent in left foot, full around the calf. Vibration: Intact in bilateral upper extremities. Absent in bilateral great toes, present in bilateral ankles. Proprioception: Intact in right great toe, ?diminished in left great toe. Coordination: Intact finger-to-  nose-finger bilaterally. Romberg negative. Gait: Able to rise from chair with arms crossed unassisted. Wide-based, steppage/slap foot gait on left.  Lab and Test Review: Internal labs: 09/28/22: CBC unremarkable BMP significant for Cr 1.70 (chronic)  HbA1c (12/10/17): 10.6 Vit D (12/10/17): 13.4  External labs: B12 (07/06/22): 773 BMP (07/06/22) significant for glucose of 403, Cr 1.6 Lipid panel (04/27/22): LDL 130 TSH (04/27/22): 1.29 HbA1c (04/27/22): 10.5  Imaging: MRI lumbar spine wo contrast (11/28/22): FINDINGS: Segmentation:  Standard.   Alignment:  Minimal grade 1 anterolisthesis of L4 on L5.   Vertebrae: No acute fracture, evidence of discitis, or aggressive bone lesion.   Conus medullaris and cauda equina: Conus extends to the L2 level. Conus and cauda equina appear normal.   Paraspinal and other soft tissues: No acute paraspinal abnormality. Multiple bilateral renal cysts.   Disc levels:   Disc spaces: Disc desiccation at L4-5 and L5-S1.   T12-L1: No significant disc bulge. Moderate bilateral facet arthropathy. No foraminal or central canal stenosis.   L1-L2: No significant disc bulge. Moderate bilateral facet arthropathy. No foraminal or central canal stenosis.   L2-L3: Mild broad-based disc bulge. Moderate bilateral facet arthropathy. No foraminal or central canal stenosis.   L3-L4: Mild broad-based disc bulge. Moderate bilateral facet arthropathy, left greater than right. No foraminal or central canal stenosis.   L4-L5: Broad-based disc bulge. Moderate bilateral facet arthropathy. Mild bilateral foraminal stenosis. No spinal stenosis.   L5-S1: Mild broad-based disc bulge. Moderate bilateral facet arthropathy, right worse than left. No foraminal or central canal stenosis.   IMPRESSION: 1. Lumbar spine spondylosis as described above. 2. No acute osseous injury of the lumbar spine.  MRI brain wo contrast (12/10/2017): FINDINGS: INTRACRANIAL  CONTENTS: No reduced diffusion to suggest acute ischemia. No susceptibility artifact to suggest hemorrhage. The ventricles and sulci are normal for patient's age. Minimal supratentorial white matter FLAIR T2 hyperintensities compatible chronic small vessel ischemic changes, less than expected for age. Old small RIGHT cerebellar infarct. No suspicious parenchymal signal, masses, mass effect. No abnormal extra-axial fluid collections. No extra-axial masses.   VASCULAR: Normal major intracranial vascular flow voids present at skull base.   SKULL AND UPPER CERVICAL SPINE: No abnormal sellar expansion. No suspicious calvarial bone marrow  signal. Craniocervical junction maintained. LEFT frontal scalp hematoma.   SINUSES/ORBITS: Trace paranasal sinus mucosal thickening and small mucosal retention cyst.Status post LEFT scleral banding and ocular lens implant.   OTHER: Patient is edentulous.   IMPRESSION: 1. No acute intracranial process.  LEFT frontal scalp hematoma. 2. Old small RIGHT cerebellar infarct, otherwise negative noncontrast MRI of the head for age.  ASSESSMENT: Xavier [name] is a 82 y.o. male who presents for evaluation of left foot drop and numbness in feet (left > right). He has a relevant medical history of poorly controlled DM, DDD of lumbar spine, HTN, HLD, prostate cancer, CAD, PAD, vit D deficiency, OA, former smoker. His neurological examination is pertinent for diminished sensation in bilateral lower extremities and weakness in left DF and eversion. This is bilateral great toe weakness. Available diagnostic data is significant for HbA1c of 10.5. MRI lumbar spine showed arthrosis, but without significant canal or neural foraminal stenosis. The etiology of patient's symptoms are likely multifactorial. 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 There is asymmetry though that suggests an overlapping process in  the left lower limb. His exam suggests a left peroneal/fibular neuropathy but a lumbar radiculopathy, such as L5, is also possible. I will send labs to look for treatable causes of neuropathy. I will also get an EMG to further clarify symptoms. Patient also needs PT, which he thinks has already been ordered.  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 He is on appropriate secondary stroke prevention medications, asa 81 mg and atorvastatin 80 mg. His LDL is still quite high. In combination with elevated BP in our office and poorly controlled DM, medication non-adherence is a concern.  PLAN: -Blood work: B1, IFE, Vit D -EMG: PN (L > R) -Physical therapy referral - patient will find out if this has already been ordered; if it has not, he will call office and I will order -Agree with getting patient AFO (currently on order) -Continue asa 81 mg daily -Continue atorvastatin 80 mg daily -Recommend Vitamin D 1000 international units (IU) daily   -Return to clinic in 3 months  The impression above as well as the plan as outlined below were extensively discussed with the patient (in the company of wife) who voiced understanding. All questions were answered to their satisfaction.  The patient was counseled on pertinent fall precautions per the printed material provided today, and as noted under the "Patient Instructions" section below.  When available, results of the above investigations and possible further recommendations will be communicated to the patient via telephone/MyChart. Patient to call office if not contacted after expected testing turnaround time.   Total time spent reviewing records, interview, history/exam, documentation, and coordination of care on day of encounter:  75 min   Thank you for allowing me to participate in patient's care.  If I can answer any additional questions, I would be pleased to do so.  Jacquelyne Balint, MD   CC: Xavier Ran,  MD [address] [place] [address]  CC: Referring provider: [name], MD [address] [place],  [place] [address]

## 2023-01-19 ENCOUNTER — Other Ambulatory Visit (INDEPENDENT_AMBULATORY_CARE_PROVIDER_SITE_OTHER): Payer: PPO

## 2023-01-19 ENCOUNTER — Ambulatory Visit: Payer: PPO | Admitting: Neurology

## 2023-01-19 ENCOUNTER — Encounter: Payer: Self-pay | Admitting: Neurology

## 2023-01-19 VITALS — BP 142/60 | HR 67 | Ht 74.0 in | Wt 183.6 lb

## 2023-01-19 DIAGNOSIS — E1122 Type 2 diabetes mellitus with diabetic chronic kidney disease: Secondary | ICD-10-CM | POA: Diagnosis not present

## 2023-01-19 DIAGNOSIS — R2689 Other abnormalities of gait and mobility: Secondary | ICD-10-CM | POA: Diagnosis not present

## 2023-01-19 DIAGNOSIS — E1142 Type 2 diabetes mellitus with diabetic polyneuropathy: Secondary | ICD-10-CM | POA: Diagnosis not present

## 2023-01-19 DIAGNOSIS — E785 Hyperlipidemia, unspecified: Secondary | ICD-10-CM

## 2023-01-19 DIAGNOSIS — I639 Cerebral infarction, unspecified: Secondary | ICD-10-CM | POA: Diagnosis not present

## 2023-01-19 DIAGNOSIS — N183 Chronic kidney disease, stage 3 unspecified: Secondary | ICD-10-CM

## 2023-01-19 DIAGNOSIS — E559 Vitamin D deficiency, unspecified: Secondary | ICD-10-CM | POA: Diagnosis not present

## 2023-01-19 DIAGNOSIS — M21372 Foot drop, left foot: Secondary | ICD-10-CM | POA: Diagnosis not present

## 2023-01-19 DIAGNOSIS — Z794 Long term (current) use of insulin: Secondary | ICD-10-CM

## 2023-01-19 NOTE — Patient Instructions (Signed)
I saw you today for the weakness in your left foot. It does appear you have neuropathy, probably from diabetes, but there may be something else contributing to the weakness in your foot, such as a pinched nerve in your leg or in your back.  I would like to investigate your symptoms further with the following: -Blood work today -Muscle and nerve test called EMG (see more information below) - please do not wear lotion on the day of EMG.  You should be going to physical therapy for your foot drop. You think this has already been ordered, but if you find it has not, please let me know and I will order it.  I agree with getting the AFO for your left ankle to prevent trips and falls.  You also had a stroke in the past. For this, you should: -Continue aspirin 81 mg daily -Continue atorvastatin 80 mg daily  I recommend taking vitamin D supplement 1000 international units daily as your vitamin D was low in the past.  I will be in touch when I have your results to discuss next steps.  I will see you back in clinic in 3 months. Please let me know if you have any questions or concerns in the meantime.   The physicians and staff at [facility] are committed to providing excellent care. You may receive a survey requesting feedback about your experience at our office. We strive to receive "very good" responses to the survey questions. If you feel that your experience would prevent you from giving the office a "very good " response, please contact our office to try to remedy the situation. We may be reached at [phone]. Thank you for taking the time out of your busy day to complete the survey.  Jacquelyne Balint, MD [facility]  ELECTROMYOGRAM AND NERVE CONDUCTION STUDIES (EMG/NCS) INSTRUCTIONS  How to Prepare The neurologist conducting the EMG will need to know if you have certain medical conditions. Tell the neurologist and other EMG lab personnel if you: Have a pacemaker or any other  electrical medical device Take blood-thinning medications Have hemophilia, a blood-clotting disorder that causes prolonged bleeding Bathing Take a shower or bath shortly before your exam in order to remove oils from your skin. Don't apply lotions or creams before the exam.  What to Expect You'll likely be asked to change into a hospital gown for the procedure and lie down on an examination table. The following explanations can help you understand what will happen during the exam.  Electrodes. The neurologist or a technician places surface electrodes at various locations on your skin depending on where you're experiencing symptoms. Or the neurologist may insert needle electrodes at different sites depending on your symptoms.  Sensations. The electrodes will at times transmit a tiny electrical current that you may feel as a twinge or spasm. The needle electrode may cause discomfort or pain that usually ends shortly after the needle is removed. If you are concerned about discomfort or pain, you may want to talk to the neurologist about taking a short break during the exam.  Instructions. During the needle EMG, the neurologist will assess whether there is any spontaneous electrical activity when the muscle is at rest - activity that isn't present in healthy muscle tissue - and the degree of activity when you slightly contract the muscle.  He or she will give you instructions on resting and contracting a muscle at appropriate times. Depending on what muscles and nerves the neurologist is examining,  he or she may ask you to change positions during the exam.  After your EMG You may experience some temporary, minor bruising where the needle electrode was inserted into your muscle. This bruising should fade within several days. If it persists, contact your primary care doctor.    Preventing Falls at [facility] are common, often dreaded events in the lives of older people. Aside from the obvious injuries and  even death that may result, fall can cause wide-ranging consequences including loss of independence, mental decline, decreased activity and mobility. Younger people are also at risk of falling, especially those with chronic illnesses and fatigue.  Ways to reduce risk for falling Examine diet and medications. Warm foods and alcohol dilate blood vessels, which can lead to dizziness when standing. Sleep aids, antidepressants and pain medications can also increase the likelihood of a fall.  Get a vision exam. Poor vision, cataracts and glaucoma increase the chances of falling.  Check foot gear. Shoes should fit snugly and have a sturdy, nonskid sole and a broad, low heel  Participate in a physician-approved exercise program to build and maintain muscle strength and improve balance and coordination. Programs that use ankle weights or stretch bands are excellent for muscle-strengthening. Water aerobics programs and low-impact Tai Chi programs have also been shown to improve balance and coordination.  Increase vitamin D intake. Vitamin D improves muscle strength and increases the amount of calcium the body is able to absorb and deposit in bones.  How to prevent falls from common hazards Floors - Remove all loose wires, cords, and throw rugs. Minimize clutter. Make sure rugs are anchored and smooth. Keep furniture in its usual place.  Chairs -- Use chairs with straight backs, armrests and firm seats. Add firm cushions to existing pieces to add height.  Bathroom - Install grab bars and non-skid tape in the tub or shower. Use a bathtub transfer bench or a shower chair with a back support Use an elevated toilet seat and/or safety rails to assist standing from a low surface. Do not use towel racks or bathroom tissue holders to help you stand.  Lighting - Make sure halls, stairways, and entrances are well-lit. Install a night light in your bathroom or hallway. Make sure there is a light switch at the top and  bottom of the staircase. Turn lights on if you get up in the middle of the night. Make sure lamps or light switches are within reach of the bed if you have to get up during the night.  Kitchen - Install non-skid rubber mats near the sink and stove. Clean spills immediately. Store frequently used utensils, pots, pans between waist and eye level. This helps prevent reaching and bending. Sit when getting things out of lower cupboards.  Living room/ Bedrooms - Place furniture with wide spaces in between, giving enough room to move around. Establish a route through the living room that gives you something to hold onto as you walk.  Stairs - Make sure treads, rails, and rugs are secure. Install a rail on both sides of the stairs. If stairs are a threat, it might be helpful to arrange most of your activities on the lower level to reduce the number of times you must climb the stairs.  Entrances and doorways - Install metal handles on the walls adjacent to the doorknobs of all doors to make it more secure as you travel through the doorway.  Tips for maintaining balance Keep at least one hand free at  all times. Try using a backpack or fanny pack to hold things rather than carrying them in your hands. Never carry objects in both hands when walking as this interferes with keeping your balance.  Attempt to swing both arms from front to back while walking. This might require a conscious effort if Parkinson's disease has diminished your movement. It will, however, help you to maintain balance and posture, and reduce fatigue.  Consciously lift your feet off of the ground when walking. Shuffling and dragging of the feet is a common culprit in losing your balance.  When trying to navigate turns, use a "U" technique of facing forward and making a wide turn, rather than pivoting sharply.  Try to stand with your feet shoulder-length apart. When your feet are close together for any length of time, you increase your  risk of losing your balance and falling.  Do one thing at a time. Don't try to walk and accomplish another task, such as reading or looking around. The decrease in your automatic reflexes complicates motor function, so the less distraction, the better.  Do not wear rubber or gripping soled shoes, they might "catch" on the floor and cause tripping.  Move slowly when changing positions. Use deliberate, concentrated movements and, if needed, use a grab bar or walking aid. Count 15 seconds between each movement. For example, when rising from a seated position, wait 15 seconds after standing to begin walking.  If balance is a continuous problem, you might want to consider a walking aid such as a cane, walking stick, or walker. Once you've mastered walking with help, you might be ready to try it on your own again.

## 2023-01-20 LAB — VITAMIN D 25 HYDROXY (VIT D DEFICIENCY, FRACTURES): VITD: 20.26 ng/mL — ABNORMAL LOW (ref 30.00–100.00)

## 2023-01-23 ENCOUNTER — Ambulatory Visit: Payer: PPO | Admitting: Podiatry

## 2023-01-23 LAB — VITAMIN B1: Vitamin B1 (Thiamine): 8 nmol/L (ref 8–30)

## 2023-01-25 LAB — IMMUNOFIXATION, SERUM
IgA/Immunoglobulin A, Serum: 216 mg/dL (ref 61–437)
IgG (Immunoglobin G), Serum: 1119 mg/dL (ref 603–1613)
IgM (Immunoglobulin M), Srm: 32 mg/dL (ref 15–143)

## 2023-01-31 DIAGNOSIS — I129 Hypertensive chronic kidney disease with stage 1 through stage 4 chronic kidney disease, or unspecified chronic kidney disease: Secondary | ICD-10-CM | POA: Diagnosis not present

## 2023-01-31 DIAGNOSIS — E559 Vitamin D deficiency, unspecified: Secondary | ICD-10-CM | POA: Diagnosis not present

## 2023-01-31 DIAGNOSIS — N1832 Chronic kidney disease, stage 3b: Secondary | ICD-10-CM | POA: Diagnosis not present

## 2023-01-31 DIAGNOSIS — E1122 Type 2 diabetes mellitus with diabetic chronic kidney disease: Secondary | ICD-10-CM | POA: Diagnosis not present

## 2023-02-06 ENCOUNTER — Telehealth: Payer: Self-pay | Admitting: Neurology

## 2023-02-06 ENCOUNTER — Ambulatory Visit: Payer: PPO | Admitting: Neurology

## 2023-02-06 DIAGNOSIS — E1142 Type 2 diabetes mellitus with diabetic polyneuropathy: Secondary | ICD-10-CM

## 2023-02-06 DIAGNOSIS — M21372 Foot drop, left foot: Secondary | ICD-10-CM

## 2023-02-06 NOTE — Procedures (Signed)
[facility] Neurology  [address] [place], [address]  [place], [place] [address] Tel: [phone] Fax: [phone] Test Date:  02/06/2023  Patient: Xavier [name]  DOB: August 02, 1940 Physician: Jacquelyne Balint, MD  Sex: Male Height: 6\' 2"  Ref Phys: Jacquelyne Balint, MD  ID#: [id number]   Technician:    History: This is an 82 year old male with left greater than right foot numbness.  NCV & EMG Findings: Extensive electrodiagnostic evaluation of the left lower limb with additional nerve conduction studies of the left upper limb shows: Left sural, superficial peroneal/fibular, median, and ulnar sensory responses are absent. Left radial sensory response shows reduced amplitude (8 V). Left peroneal/fibular (EDB) and tibial (AH) motor responses are absent. Left peroneal/fibular (TA) motor response shows reduced amplitude (1.46 mV). There is no slowing of conduction velocity across the fibular head or conduction block seen. Left median (APB) and ulnar (ADM) motor responses are within normal limits. Left H reflex is absent. Chronic motor axon loss changes WITH active denervation changes are seen in the left tibialis anterior muscle. Chronic motor axon loss changes WITHOUT active denervation changes are seen in the left flexor digitorum longus and peroneus/fibularis longus muscles.  Neuromuscular ultrasound findings: High frequency (4.0-16.0 MHz) B-mode, nonvascular ultrasound of the left lower limb shows normal cross sectional area (CSA) of the left peroneal/fibular nerve at the popliteal fossa and at the fibular head. No other obvious lesion involving the adjacent bone or tendon is identified. No definite vascular abnormalities.  Impression: This is an abnormal study. 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 An overlapping left L5 radiculopathy cannot be completely excluded,  though findings may be due to #1 above. No definitive electrodiagnostic or ultrasonographic evidence of a left peroneal/fibular mononeuropathy at the fibular head.   ___________________________ Jacquelyne Balint, MD NCS+ Motor Nerve Results    Latency Amplitude F-Lat Segment Distance CV Comment  Site (ms) Norm (mV) Norm (ms)  (cm) (m/s) Norm   Left Fibular (EDB) Motor  Ankle *NR  < 6.0 *NR  > 2.5        Bel fib head *NR - *NR -  Bel fib head-Ankle - *NR  > 40   Pop fossa *NR - *NR -  Pop fossa-Bel fib head - *NR -   Left Fibular (TA) Motor  Fib head 3.4  < 4.5 *1.46  > 3.0        Pop fossa 5.5  < 6.7 1.25 -  Pop fossa-Fib head 9.5 45  > 40   Left Median (APB) Motor  Wrist 3.0  < 4.0 7.3  > 5.0        Elbow 10.0 - 6.3 -  Elbow-Wrist 35.5 51  > 50   Left Tibial (AH) Motor  Ankle *NR  < 6.0 *NR  > 4.0        Knee *NR - *NR -  Knee-Ankle - *NR  > 40   Left Ulnar (ADM) Motor  Wrist 2.9  < 3.1 8.0  > 7.0        Bel elbow 8.1 - 7.1 -  Bel elbow-Wrist 26 50  > 50   Ab elbow 10.1 - 6.5 -  Ab elbow-Bel elbow 10 50 -    Sensory Sites    Neg Peak Lat Amplitude (O-P) Segment Distance Velocity Comment  Site (ms) Norm (V) Norm  (cm) (ms)   Left Median Sensory  Wrist-Dig II *NR  < 3.8 *NR  > 10 Wrist-Dig II 13    Left Radial Sensory  Forearm-Wrist 2.2  < 2.8 *8  > 10 Forearm-Wrist 10    Left Superficial Fibular Sensory  14 cm-Ankle *NR  < 4.6 *NR  > 3 14 cm-Ankle 14    Left Sural Sensory  Calf-Lat mall *NR  < 4.6 *NR  > 3 Calf-Lat mall 14    Left Ulnar Sensory  Wrist-Dig V *NR  < 3.2 *NR  > 5 Wrist-Dig V 11     H-Reflex Results    M-Lat H Lat H Neg Amp H-M Lat  Site (ms) (ms) Norm (mV) (ms)  Left Tibial H-Reflex  Pop fossa 10.0 ---  < 35.0 --- ---   EMG+   Side Muscle Ins.Act Fibs Fasc Recrt Amp Dur Poly Activation Comment  Left Tib ant Nml *1+ Nml *SMU *1+ *1+ *1+ Nml N/A  Left Gastroc MH Nml Nml Nml Nml Nml Nml Nml Nml N/A  Left FDL Nml Nml Nml *2- *1+ *1+ *1+ Nml N/A  Left Fib  longus Nml Nml Nml *1- *1+ *1+ *1+ Nml N/A  Left Rectus fem Nml Nml Nml Nml Nml Nml Nml Nml N/A  Left Biceps fem SH Nml Nml Nml Nml Nml Nml Nml Nml N/A  Left Gluteus med Nml Nml Nml Nml Nml Nml Nml Nml N/A  Left L5 PSP Nml Nml Nml Nml Nml Nml Nml Nml N/A   Nerve Measurements   Site Area Mobility Vascularity Comment   mm Norm     Left Fibular  Fib head 7.7  < 17.8      Pop fossa 9.2  < 20.9          Waveforms:  Motor             Sensory             H-Reflex

## 2023-02-06 NOTE — Telephone Encounter (Signed)
Discussed the results of patient's EMG after the procedure today. 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 There was no definitive lumbosacral radiculopathy (though a possible overlapping L5 radiculopathy) and no evidence of a peroneal neuropathy at the fibular head.  Patient will go to PT and get AFO as planned and follow up with me on 05/05/23.  All questions were answered.  Jacquelyne Balint, MD [facility] Neurology

## 2023-02-07 ENCOUNTER — Telehealth: Payer: Self-pay

## 2023-02-07 NOTE — Telephone Encounter (Signed)
Xavier [name] request some PW before leaving yesterday and was unable to talk with me before leaving. Called him this And and left a message for him to call office. I talked with Dr. Loleta Chance and he had not asked for paper work from him. Pt had an EMG done.

## 2023-02-14 DIAGNOSIS — I10 Essential (primary) hypertension: Secondary | ICD-10-CM | POA: Diagnosis not present

## 2023-02-14 DIAGNOSIS — E1159 Type 2 diabetes mellitus with other circulatory complications: Secondary | ICD-10-CM | POA: Diagnosis not present

## 2023-02-14 DIAGNOSIS — N189 Chronic kidney disease, unspecified: Secondary | ICD-10-CM | POA: Diagnosis not present

## 2023-02-14 DIAGNOSIS — Z87891 Personal history of nicotine dependence: Secondary | ICD-10-CM | POA: Diagnosis not present

## 2023-02-14 DIAGNOSIS — E1122 Type 2 diabetes mellitus with diabetic chronic kidney disease: Secondary | ICD-10-CM | POA: Diagnosis not present

## 2023-02-14 DIAGNOSIS — Z8639 Personal history of other endocrine, nutritional and metabolic disease: Secondary | ICD-10-CM | POA: Diagnosis not present

## 2023-02-14 DIAGNOSIS — M21372 Foot drop, left foot: Secondary | ICD-10-CM | POA: Diagnosis not present

## 2023-02-15 DIAGNOSIS — M21372 Foot drop, left foot: Secondary | ICD-10-CM | POA: Diagnosis not present

## 2023-02-17 ENCOUNTER — Inpatient Hospital Stay (HOSPITAL_COMMUNITY)
Admission: EM | Admit: 2023-02-17 | Discharge: 2023-03-12 | DRG: 023 | Disposition: E | Payer: PPO | Attending: Critical Care Medicine | Admitting: Critical Care Medicine

## 2023-02-17 ENCOUNTER — Encounter (HOSPITAL_COMMUNITY): Admission: EM | Disposition: E | Payer: Self-pay | Source: Home / Self Care | Attending: Student

## 2023-02-17 ENCOUNTER — Inpatient Hospital Stay (HOSPITAL_COMMUNITY): Payer: PPO

## 2023-02-17 ENCOUNTER — Emergency Department (HOSPITAL_COMMUNITY): Payer: PPO

## 2023-02-17 ENCOUNTER — Inpatient Hospital Stay (HOSPITAL_COMMUNITY): Payer: PPO | Admitting: Registered Nurse

## 2023-02-17 DIAGNOSIS — E1142 Type 2 diabetes mellitus with diabetic polyneuropathy: Secondary | ICD-10-CM | POA: Diagnosis present

## 2023-02-17 DIAGNOSIS — T508X5A Adverse effect of diagnostic agents, initial encounter: Secondary | ICD-10-CM | POA: Diagnosis not present

## 2023-02-17 DIAGNOSIS — G8191 Hemiplegia, unspecified affecting right dominant side: Secondary | ICD-10-CM | POA: Diagnosis not present

## 2023-02-17 DIAGNOSIS — J1108 Influenza due to unidentified influenza virus with specified pneumonia: Secondary | ICD-10-CM | POA: Diagnosis not present

## 2023-02-17 DIAGNOSIS — R410 Disorientation, unspecified: Secondary | ICD-10-CM | POA: Diagnosis not present

## 2023-02-17 DIAGNOSIS — N183 Chronic kidney disease, stage 3 unspecified: Secondary | ICD-10-CM

## 2023-02-17 DIAGNOSIS — N179 Acute kidney failure, unspecified: Secondary | ICD-10-CM | POA: Diagnosis not present

## 2023-02-17 DIAGNOSIS — J432 Centrilobular emphysema: Secondary | ICD-10-CM | POA: Diagnosis not present

## 2023-02-17 DIAGNOSIS — R531 Weakness: Secondary | ICD-10-CM

## 2023-02-17 DIAGNOSIS — I609 Nontraumatic subarachnoid hemorrhage, unspecified: Secondary | ICD-10-CM | POA: Diagnosis not present

## 2023-02-17 DIAGNOSIS — G9341 Metabolic encephalopathy: Secondary | ICD-10-CM | POA: Diagnosis not present

## 2023-02-17 DIAGNOSIS — N281 Cyst of kidney, acquired: Secondary | ICD-10-CM | POA: Diagnosis not present

## 2023-02-17 DIAGNOSIS — G936 Cerebral edema: Secondary | ICD-10-CM | POA: Diagnosis not present

## 2023-02-17 DIAGNOSIS — J111 Influenza due to unidentified influenza virus with other respiratory manifestations: Secondary | ICD-10-CM | POA: Diagnosis not present

## 2023-02-17 DIAGNOSIS — R5381 Other malaise: Secondary | ICD-10-CM

## 2023-02-17 DIAGNOSIS — I63511 Cerebral infarction due to unspecified occlusion or stenosis of right middle cerebral artery: Secondary | ICD-10-CM | POA: Diagnosis not present

## 2023-02-17 DIAGNOSIS — Z66 Do not resuscitate: Secondary | ICD-10-CM | POA: Diagnosis not present

## 2023-02-17 DIAGNOSIS — I2510 Atherosclerotic heart disease of native coronary artery without angina pectoris: Secondary | ICD-10-CM | POA: Diagnosis present

## 2023-02-17 DIAGNOSIS — Z7189 Other specified counseling: Secondary | ICD-10-CM | POA: Diagnosis not present

## 2023-02-17 DIAGNOSIS — R6520 Severe sepsis without septic shock: Secondary | ICD-10-CM | POA: Diagnosis not present

## 2023-02-17 DIAGNOSIS — N1831 Chronic kidney disease, stage 3a: Secondary | ICD-10-CM | POA: Diagnosis not present

## 2023-02-17 DIAGNOSIS — R0683 Snoring: Secondary | ICD-10-CM | POA: Diagnosis not present

## 2023-02-17 DIAGNOSIS — J90 Pleural effusion, not elsewhere classified: Secondary | ICD-10-CM | POA: Diagnosis not present

## 2023-02-17 DIAGNOSIS — Z87891 Personal history of nicotine dependence: Secondary | ICD-10-CM

## 2023-02-17 DIAGNOSIS — I959 Hypotension, unspecified: Secondary | ICD-10-CM | POA: Diagnosis not present

## 2023-02-17 DIAGNOSIS — R930 Abnormal findings on diagnostic imaging of skull and head, not elsewhere classified: Secondary | ICD-10-CM | POA: Diagnosis not present

## 2023-02-17 DIAGNOSIS — E785 Hyperlipidemia, unspecified: Secondary | ICD-10-CM | POA: Diagnosis present

## 2023-02-17 DIAGNOSIS — L89312 Pressure ulcer of right buttock, stage 2: Secondary | ICD-10-CM | POA: Diagnosis not present

## 2023-02-17 DIAGNOSIS — I129 Hypertensive chronic kidney disease with stage 1 through stage 4 chronic kidney disease, or unspecified chronic kidney disease: Secondary | ICD-10-CM | POA: Diagnosis not present

## 2023-02-17 DIAGNOSIS — Z79899 Other long term (current) drug therapy: Secondary | ICD-10-CM

## 2023-02-17 DIAGNOSIS — I4891 Unspecified atrial fibrillation: Secondary | ICD-10-CM

## 2023-02-17 DIAGNOSIS — Z7901 Long term (current) use of anticoagulants: Secondary | ICD-10-CM

## 2023-02-17 DIAGNOSIS — G4710 Hypersomnia, unspecified: Secondary | ICD-10-CM | POA: Diagnosis present

## 2023-02-17 DIAGNOSIS — R739 Hyperglycemia, unspecified: Secondary | ICD-10-CM | POA: Diagnosis not present

## 2023-02-17 DIAGNOSIS — I517 Cardiomegaly: Secondary | ICD-10-CM | POA: Diagnosis not present

## 2023-02-17 DIAGNOSIS — I6389 Other cerebral infarction: Secondary | ICD-10-CM | POA: Diagnosis not present

## 2023-02-17 DIAGNOSIS — J9601 Acute respiratory failure with hypoxia: Secondary | ICD-10-CM | POA: Diagnosis not present

## 2023-02-17 DIAGNOSIS — I2489 Other forms of acute ischemic heart disease: Secondary | ICD-10-CM | POA: Diagnosis not present

## 2023-02-17 DIAGNOSIS — R1310 Dysphagia, unspecified: Secondary | ICD-10-CM | POA: Diagnosis present

## 2023-02-17 DIAGNOSIS — R9089 Other abnormal findings on diagnostic imaging of central nervous system: Secondary | ICD-10-CM | POA: Diagnosis not present

## 2023-02-17 DIAGNOSIS — K6289 Other specified diseases of anus and rectum: Secondary | ICD-10-CM | POA: Diagnosis present

## 2023-02-17 DIAGNOSIS — R0902 Hypoxemia: Secondary | ICD-10-CM | POA: Diagnosis not present

## 2023-02-17 DIAGNOSIS — A4189 Other specified sepsis: Secondary | ICD-10-CM | POA: Diagnosis present

## 2023-02-17 DIAGNOSIS — G9340 Encephalopathy, unspecified: Secondary | ICD-10-CM | POA: Diagnosis not present

## 2023-02-17 DIAGNOSIS — R918 Other nonspecific abnormal finding of lung field: Secondary | ICD-10-CM | POA: Diagnosis not present

## 2023-02-17 DIAGNOSIS — E1159 Type 2 diabetes mellitus with other circulatory complications: Secondary | ICD-10-CM | POA: Diagnosis not present

## 2023-02-17 DIAGNOSIS — R29810 Facial weakness: Secondary | ICD-10-CM | POA: Diagnosis present

## 2023-02-17 DIAGNOSIS — E871 Hypo-osmolality and hyponatremia: Secondary | ICD-10-CM | POA: Diagnosis present

## 2023-02-17 DIAGNOSIS — R471 Dysarthria and anarthria: Secondary | ICD-10-CM | POA: Diagnosis present

## 2023-02-17 DIAGNOSIS — Z8616 Personal history of COVID-19: Secondary | ICD-10-CM | POA: Diagnosis not present

## 2023-02-17 DIAGNOSIS — J1282 Pneumonia due to coronavirus disease 2019: Secondary | ICD-10-CM | POA: Diagnosis not present

## 2023-02-17 DIAGNOSIS — R310 Gross hematuria: Secondary | ICD-10-CM | POA: Diagnosis not present

## 2023-02-17 DIAGNOSIS — I6601 Occlusion and stenosis of right middle cerebral artery: Secondary | ICD-10-CM | POA: Diagnosis not present

## 2023-02-17 DIAGNOSIS — I639 Cerebral infarction, unspecified: Secondary | ICD-10-CM | POA: Diagnosis not present

## 2023-02-17 DIAGNOSIS — I63512 Cerebral infarction due to unspecified occlusion or stenosis of left middle cerebral artery: Secondary | ICD-10-CM | POA: Diagnosis not present

## 2023-02-17 DIAGNOSIS — F05 Delirium due to known physiological condition: Secondary | ICD-10-CM | POA: Diagnosis not present

## 2023-02-17 DIAGNOSIS — R0603 Acute respiratory distress: Secondary | ICD-10-CM | POA: Diagnosis not present

## 2023-02-17 DIAGNOSIS — G9389 Other specified disorders of brain: Secondary | ICD-10-CM | POA: Diagnosis present

## 2023-02-17 DIAGNOSIS — R140 Abdominal distension (gaseous): Secondary | ICD-10-CM | POA: Diagnosis not present

## 2023-02-17 DIAGNOSIS — I480 Paroxysmal atrial fibrillation: Secondary | ICD-10-CM | POA: Diagnosis not present

## 2023-02-17 DIAGNOSIS — E878 Other disorders of electrolyte and fluid balance, not elsewhere classified: Secondary | ICD-10-CM | POA: Diagnosis not present

## 2023-02-17 DIAGNOSIS — Z794 Long term (current) use of insulin: Secondary | ICD-10-CM | POA: Diagnosis not present

## 2023-02-17 DIAGNOSIS — I63431 Cerebral infarction due to embolism of right posterior cerebral artery: Secondary | ICD-10-CM | POA: Diagnosis present

## 2023-02-17 DIAGNOSIS — I130 Hypertensive heart and chronic kidney disease with heart failure and stage 1 through stage 4 chronic kidney disease, or unspecified chronic kidney disease: Secondary | ICD-10-CM | POA: Diagnosis present

## 2023-02-17 DIAGNOSIS — Z8546 Personal history of malignant neoplasm of prostate: Secondary | ICD-10-CM

## 2023-02-17 DIAGNOSIS — Z515 Encounter for palliative care: Secondary | ICD-10-CM | POA: Diagnosis not present

## 2023-02-17 DIAGNOSIS — Z8673 Personal history of transient ischemic attack (TIA), and cerebral infarction without residual deficits: Secondary | ICD-10-CM

## 2023-02-17 DIAGNOSIS — I739 Peripheral vascular disease, unspecified: Secondary | ICD-10-CM | POA: Diagnosis not present

## 2023-02-17 DIAGNOSIS — I5031 Acute diastolic (congestive) heart failure: Secondary | ICD-10-CM | POA: Diagnosis not present

## 2023-02-17 DIAGNOSIS — E1165 Type 2 diabetes mellitus with hyperglycemia: Secondary | ICD-10-CM | POA: Diagnosis present

## 2023-02-17 DIAGNOSIS — K219 Gastro-esophageal reflux disease without esophagitis: Secondary | ICD-10-CM | POA: Diagnosis present

## 2023-02-17 DIAGNOSIS — I63211 Cerebral infarction due to unspecified occlusion or stenosis of right vertebral arteries: Secondary | ICD-10-CM | POA: Diagnosis not present

## 2023-02-17 DIAGNOSIS — R509 Fever, unspecified: Secondary | ICD-10-CM | POA: Diagnosis not present

## 2023-02-17 DIAGNOSIS — N1411 Contrast-induced nephropathy: Secondary | ICD-10-CM | POA: Diagnosis not present

## 2023-02-17 DIAGNOSIS — U071 COVID-19: Secondary | ICD-10-CM | POA: Diagnosis not present

## 2023-02-17 DIAGNOSIS — I5033 Acute on chronic diastolic (congestive) heart failure: Secondary | ICD-10-CM | POA: Diagnosis not present

## 2023-02-17 DIAGNOSIS — Z4682 Encounter for fitting and adjustment of non-vascular catheter: Secondary | ICD-10-CM | POA: Diagnosis not present

## 2023-02-17 DIAGNOSIS — R5383 Other fatigue: Secondary | ICD-10-CM | POA: Diagnosis not present

## 2023-02-17 DIAGNOSIS — G629 Polyneuropathy, unspecified: Secondary | ICD-10-CM | POA: Diagnosis not present

## 2023-02-17 DIAGNOSIS — R55 Syncope and collapse: Secondary | ICD-10-CM | POA: Diagnosis not present

## 2023-02-17 DIAGNOSIS — I161 Hypertensive emergency: Secondary | ICD-10-CM | POA: Diagnosis present

## 2023-02-17 DIAGNOSIS — N184 Chronic kidney disease, stage 4 (severe): Secondary | ICD-10-CM | POA: Diagnosis not present

## 2023-02-17 DIAGNOSIS — J690 Pneumonitis due to inhalation of food and vomit: Secondary | ICD-10-CM | POA: Diagnosis not present

## 2023-02-17 DIAGNOSIS — N1832 Chronic kidney disease, stage 3b: Secondary | ICD-10-CM | POA: Diagnosis not present

## 2023-02-17 DIAGNOSIS — D631 Anemia in chronic kidney disease: Secondary | ICD-10-CM | POA: Diagnosis present

## 2023-02-17 DIAGNOSIS — E1122 Type 2 diabetes mellitus with diabetic chronic kidney disease: Secondary | ICD-10-CM | POA: Diagnosis present

## 2023-02-17 DIAGNOSIS — M21372 Foot drop, left foot: Secondary | ICD-10-CM | POA: Diagnosis present

## 2023-02-17 DIAGNOSIS — Z7982 Long term (current) use of aspirin: Secondary | ICD-10-CM

## 2023-02-17 DIAGNOSIS — J9600 Acute respiratory failure, unspecified whether with hypoxia or hypercapnia: Secondary | ICD-10-CM | POA: Diagnosis not present

## 2023-02-17 DIAGNOSIS — E1151 Type 2 diabetes mellitus with diabetic peripheral angiopathy without gangrene: Secondary | ICD-10-CM | POA: Diagnosis present

## 2023-02-17 DIAGNOSIS — E870 Hyperosmolality and hypernatremia: Secondary | ICD-10-CM | POA: Diagnosis not present

## 2023-02-17 DIAGNOSIS — R29705 NIHSS score 5: Secondary | ICD-10-CM | POA: Diagnosis present

## 2023-02-17 DIAGNOSIS — J811 Chronic pulmonary edema: Secondary | ICD-10-CM | POA: Diagnosis not present

## 2023-02-17 DIAGNOSIS — R197 Diarrhea, unspecified: Secondary | ICD-10-CM | POA: Diagnosis not present

## 2023-02-17 DIAGNOSIS — G8194 Hemiplegia, unspecified affecting left nondominant side: Secondary | ICD-10-CM | POA: Diagnosis not present

## 2023-02-17 DIAGNOSIS — R29818 Other symptoms and signs involving the nervous system: Secondary | ICD-10-CM | POA: Diagnosis not present

## 2023-02-17 DIAGNOSIS — J984 Other disorders of lung: Secondary | ICD-10-CM | POA: Diagnosis not present

## 2023-02-17 DIAGNOSIS — G9349 Other encephalopathy: Secondary | ICD-10-CM | POA: Diagnosis not present

## 2023-02-17 DIAGNOSIS — N170 Acute kidney failure with tubular necrosis: Secondary | ICD-10-CM | POA: Diagnosis not present

## 2023-02-17 DIAGNOSIS — Y95 Nosocomial condition: Secondary | ICD-10-CM | POA: Diagnosis not present

## 2023-02-17 DIAGNOSIS — I6501 Occlusion and stenosis of right vertebral artery: Secondary | ICD-10-CM | POA: Diagnosis not present

## 2023-02-17 DIAGNOSIS — K567 Ileus, unspecified: Secondary | ICD-10-CM | POA: Diagnosis not present

## 2023-02-17 DIAGNOSIS — R4701 Aphasia: Secondary | ICD-10-CM | POA: Diagnosis not present

## 2023-02-17 DIAGNOSIS — E1129 Type 2 diabetes mellitus with other diabetic kidney complication: Secondary | ICD-10-CM | POA: Diagnosis present

## 2023-02-17 DIAGNOSIS — R001 Bradycardia, unspecified: Secondary | ICD-10-CM | POA: Diagnosis present

## 2023-02-17 DIAGNOSIS — D649 Anemia, unspecified: Secondary | ICD-10-CM | POA: Diagnosis not present

## 2023-02-17 DIAGNOSIS — R0989 Other specified symptoms and signs involving the circulatory and respiratory systems: Secondary | ICD-10-CM | POA: Diagnosis not present

## 2023-02-17 DIAGNOSIS — L8990 Pressure ulcer of unspecified site, unspecified stage: Secondary | ICD-10-CM | POA: Insufficient documentation

## 2023-02-17 DIAGNOSIS — R4589 Other symptoms and signs involving emotional state: Secondary | ICD-10-CM | POA: Diagnosis not present

## 2023-02-17 DIAGNOSIS — I6621 Occlusion and stenosis of right posterior cerebral artery: Secondary | ICD-10-CM | POA: Diagnosis not present

## 2023-02-17 DIAGNOSIS — N2889 Other specified disorders of kidney and ureter: Secondary | ICD-10-CM | POA: Diagnosis not present

## 2023-02-17 DIAGNOSIS — I6523 Occlusion and stenosis of bilateral carotid arteries: Secondary | ICD-10-CM | POA: Diagnosis not present

## 2023-02-17 DIAGNOSIS — I63531 Cerebral infarction due to unspecified occlusion or stenosis of right posterior cerebral artery: Secondary | ICD-10-CM | POA: Diagnosis not present

## 2023-02-17 DIAGNOSIS — Z781 Physical restraint status: Secondary | ICD-10-CM

## 2023-02-17 DIAGNOSIS — R339 Retention of urine, unspecified: Secondary | ICD-10-CM | POA: Diagnosis not present

## 2023-02-17 DIAGNOSIS — I63232 Cerebral infarction due to unspecified occlusion or stenosis of left carotid arteries: Secondary | ICD-10-CM | POA: Diagnosis not present

## 2023-02-17 DIAGNOSIS — R233 Spontaneous ecchymoses: Secondary | ICD-10-CM | POA: Diagnosis present

## 2023-02-17 DIAGNOSIS — L89322 Pressure ulcer of left buttock, stage 2: Secondary | ICD-10-CM | POA: Diagnosis not present

## 2023-02-17 DIAGNOSIS — C61 Malignant neoplasm of prostate: Secondary | ICD-10-CM | POA: Diagnosis not present

## 2023-02-17 HISTORY — PX: IR ANGIO VERTEBRAL SEL VERTEBRAL UNI L MOD SED: IMG5367

## 2023-02-17 HISTORY — PX: IR CT HEAD LTD: IMG2386

## 2023-02-17 HISTORY — PX: IR PERCUTANEOUS ART THROMBECTOMY/INFUSION INTRACRANIAL INC DIAG ANGIO: IMG6087

## 2023-02-17 HISTORY — PX: RADIOLOGY WITH ANESTHESIA: SHX6223

## 2023-02-17 HISTORY — PX: IR ANGIO INTRA EXTRACRAN SEL COM CAROTID INNOMINATE UNI L MOD SED: IMG5358

## 2023-02-17 LAB — I-STAT CHEM 8, ED
BUN: 24 mg/dL — ABNORMAL HIGH (ref 8–23)
Calcium, Ion: 1.14 mmol/L — ABNORMAL LOW (ref 1.15–1.40)
Chloride: 103 mmol/L (ref 98–111)
Creatinine, Ser: 2.1 mg/dL — ABNORMAL HIGH (ref 0.61–1.24)
Glucose, Bld: 238 mg/dL — ABNORMAL HIGH (ref 70–99)
HCT: 39 % (ref 39.0–52.0)
Hemoglobin: 13.3 g/dL (ref 13.0–17.0)
Potassium: 4.9 mmol/L (ref 3.5–5.1)
Sodium: 136 mmol/L (ref 135–145)
TCO2: 24 mmol/L (ref 22–32)

## 2023-02-17 LAB — CBC
HCT: 40.3 % (ref 39.0–52.0)
Hemoglobin: 12.9 g/dL — ABNORMAL LOW (ref 13.0–17.0)
MCH: 26.8 pg (ref 26.0–34.0)
MCHC: 32 g/dL (ref 30.0–36.0)
MCV: 83.6 fL (ref 80.0–100.0)
Platelets: 220 10*3/uL (ref 150–400)
RBC: 4.82 MIL/uL (ref 4.22–5.81)
RDW: 12.8 % (ref 11.5–15.5)
WBC: 8.6 10*3/uL (ref 4.0–10.5)
nRBC: 0 % (ref 0.0–0.2)

## 2023-02-17 LAB — COMPREHENSIVE METABOLIC PANEL
ALT: 16 U/L (ref 0–44)
AST: 21 U/L (ref 15–41)
Albumin: 3.5 g/dL (ref 3.5–5.0)
Alkaline Phosphatase: 91 U/L (ref 38–126)
Anion gap: 9 (ref 5–15)
BUN: 24 mg/dL — ABNORMAL HIGH (ref 8–23)
CO2: 27 mmol/L (ref 22–32)
Calcium: 9.2 mg/dL (ref 8.9–10.3)
Chloride: 100 mmol/L (ref 98–111)
Creatinine, Ser: 2 mg/dL — ABNORMAL HIGH (ref 0.61–1.24)
GFR, Estimated: 33 mL/min — ABNORMAL LOW (ref >60)
Glucose, Bld: 232 mg/dL — ABNORMAL HIGH (ref 70–99)
Potassium: 5 mmol/L (ref 3.5–5.1)
Sodium: 136 mmol/L (ref 135–145)
Total Bilirubin: 1.1 mg/dL (ref 0.3–1.2)
Total Protein: 7.1 g/dL (ref 6.5–8.1)

## 2023-02-17 LAB — DIFFERENTIAL
Abs Immature Granulocytes: 0.03 10*3/uL (ref 0.00–0.07)
Basophils Absolute: 0.1 10*3/uL (ref 0.0–0.1)
Basophils Relative: 1 %
Eosinophils Absolute: 0.1 10*3/uL (ref 0.0–0.5)
Eosinophils Relative: 1 %
Immature Granulocytes: 0 %
Lymphocytes Relative: 23 %
Lymphs Abs: 2 10*3/uL (ref 0.7–4.0)
Monocytes Absolute: 0.6 10*3/uL (ref 0.1–1.0)
Monocytes Relative: 7 %
Neutro Abs: 5.8 10*3/uL (ref 1.7–7.7)
Neutrophils Relative %: 68 %

## 2023-02-17 LAB — POCT I-STAT 7, (LYTES, BLD GAS, ICA,H+H)
Acid-base deficit: 3 mmol/L — ABNORMAL HIGH (ref 0.0–2.0)
Bicarbonate: 21.2 mmol/L (ref 20.0–28.0)
Calcium, Ion: 1.13 mmol/L — ABNORMAL LOW (ref 1.15–1.40)
HCT: 35 % — ABNORMAL LOW (ref 39.0–52.0)
Hemoglobin: 11.9 g/dL — ABNORMAL LOW (ref 13.0–17.0)
O2 Saturation: 100 %
Patient temperature: 98.6
Potassium: 4.3 mmol/L (ref 3.5–5.1)
Sodium: 135 mmol/L (ref 135–145)
TCO2: 22 mmol/L (ref 22–32)
pCO2 arterial: 33.3 mmHg (ref 32–48)
pH, Arterial: 7.413 (ref 7.35–7.45)
pO2, Arterial: 203 mmHg — ABNORMAL HIGH (ref 83–108)

## 2023-02-17 LAB — HEMOGLOBIN A1C
Hgb A1c MFr Bld: 9 % — ABNORMAL HIGH (ref 4.8–5.6)
Mean Plasma Glucose: 211.6 mg/dL

## 2023-02-17 LAB — GLUCOSE, CAPILLARY
Glucose-Capillary: 182 mg/dL — ABNORMAL HIGH (ref 70–99)
Glucose-Capillary: 243 mg/dL — ABNORMAL HIGH (ref 70–99)

## 2023-02-17 LAB — CULTURE, RESPIRATORY W GRAM STAIN

## 2023-02-17 LAB — I-STAT ARTERIAL BLOOD GAS, ED
Acid-Base Excess: 4 mmol/L — ABNORMAL HIGH (ref 0.0–2.0)
Bicarbonate: 29.4 mmol/L — ABNORMAL HIGH (ref 20.0–28.0)
Calcium, Ion: 1.2 mmol/L (ref 1.15–1.40)
HCT: 36 % — ABNORMAL LOW (ref 39.0–52.0)
Hemoglobin: 12.2 g/dL — ABNORMAL LOW (ref 13.0–17.0)
O2 Saturation: 100 %
Patient temperature: 98.6
Potassium: 4.4 mmol/L (ref 3.5–5.1)
Sodium: 136 mmol/L (ref 135–145)
TCO2: 31 mmol/L (ref 22–32)
pCO2 arterial: 45.3 mmHg (ref 32–48)
pH, Arterial: 7.421 (ref 7.35–7.45)
pO2, Arterial: 389 mmHg — ABNORMAL HIGH (ref 83–108)

## 2023-02-17 LAB — MRSA NEXT GEN BY PCR, NASAL: MRSA by PCR Next Gen: NOT DETECTED

## 2023-02-17 LAB — PROTIME-INR
INR: 1 (ref 0.8–1.2)
Prothrombin Time: 13.6 seconds (ref 11.4–15.2)

## 2023-02-17 LAB — APTT: aPTT: 23 seconds — ABNORMAL LOW (ref 24–36)

## 2023-02-17 LAB — ETHANOL: Alcohol, Ethyl (B): <10 mg/dL (ref <10)

## 2023-02-17 LAB — CBG MONITORING, ED: Glucose-Capillary: 238 mg/dL — ABNORMAL HIGH (ref 70–99)

## 2023-02-17 SURGERY — IR WITH ANESTHESIA
Anesthesia: General

## 2023-02-17 MED ORDER — IOHEXOL 350 MG/ML SOLN
75.0000 mL | Freq: Once | INTRAVENOUS | Status: AC | PRN
  Administered 2023-02-17: 75 mL via INTRAVENOUS

## 2023-02-17 MED ORDER — DOCUSATE SODIUM 50 MG/5ML PO LIQD
100.0000 mg | Freq: Two times a day (BID) | ORAL | Status: DC
  Administered 2023-02-17 – 2023-02-19 (×4): 100 mg
  Filled 2023-02-17 (×4): qty 10

## 2023-02-17 MED ORDER — PHENYLEPHRINE HCL-NACL 20-0.9 MG/250ML-% IV SOLN
INTRAVENOUS | Status: DC | PRN
  Administered 2023-02-17: 25 ug/min via INTRAVENOUS

## 2023-02-17 MED ORDER — NOREPINEPHRINE 4 MG/250ML-% IV SOLN
2.0000 ug/min | INTRAVENOUS | Status: DC
  Administered 2023-02-18: 6 ug/min via INTRAVENOUS
  Administered 2023-02-18: 2 ug/min via INTRAVENOUS
  Filled 2023-02-17 (×2): qty 250

## 2023-02-17 MED ORDER — ASPIRIN 81 MG PO CHEW
CHEWABLE_TABLET | ORAL | Status: AC
  Filled 2023-02-17: qty 1

## 2023-02-17 MED ORDER — NITROGLYCERIN 1 MG/10 ML FOR IR/CATH LAB
INTRA_ARTERIAL | Status: AC
  Filled 2023-02-17: qty 10

## 2023-02-17 MED ORDER — PROPOFOL 1000 MG/100ML IV EMUL
0.0000 ug/kg/min | INTRAVENOUS | Status: DC
  Administered 2023-02-17: 8.824 ug/kg/min via INTRAVENOUS
  Administered 2023-02-17 – 2023-02-18 (×5): 50 ug/kg/min via INTRAVENOUS
  Administered 2023-02-18: 40 ug/kg/min via INTRAVENOUS
  Administered 2023-02-18 – 2023-02-19 (×5): 50 ug/kg/min via INTRAVENOUS
  Filled 2023-02-17: qty 100
  Filled 2023-02-17: qty 200
  Filled 2023-02-17 (×7): qty 100
  Filled 2023-02-17: qty 200
  Filled 2023-02-17: qty 100

## 2023-02-17 MED ORDER — PANTOPRAZOLE SODIUM 40 MG IV SOLR
40.0000 mg | Freq: Every day | INTRAVENOUS | Status: DC
  Administered 2023-02-17 – 2023-02-19 (×3): 40 mg via INTRAVENOUS
  Filled 2023-02-17 (×4): qty 10

## 2023-02-17 MED ORDER — FENTANYL CITRATE PF 50 MCG/ML IJ SOSY
25.0000 ug | PREFILLED_SYRINGE | INTRAMUSCULAR | Status: DC | PRN
  Administered 2023-02-17: 100 ug via INTRAVENOUS
  Administered 2023-02-18 (×3): 50 ug via INTRAVENOUS
  Administered 2023-02-18: 100 ug via INTRAVENOUS
  Administered 2023-02-19 (×4): 50 ug via INTRAVENOUS
  Administered 2023-02-19: 100 ug via INTRAVENOUS
  Filled 2023-02-17: qty 2
  Filled 2023-02-17: qty 1
  Filled 2023-02-17 (×3): qty 2
  Filled 2023-02-17 (×4): qty 1

## 2023-02-17 MED ORDER — ATROPINE SULFATE 1 MG/10ML IJ SOSY
PREFILLED_SYRINGE | INTRAMUSCULAR | Status: AC
  Filled 2023-02-17: qty 10

## 2023-02-17 MED ORDER — SODIUM CHLORIDE 0.9 % IV SOLN: INTRAVENOUS | Status: DC

## 2023-02-17 MED ORDER — ORAL CARE MOUTH RINSE: 15.0000 mL | OROMUCOSAL | Status: DC | PRN

## 2023-02-17 MED ORDER — FAMOTIDINE 20 MG PO TABS: 20.0000 mg | ORAL_TABLET | Freq: Two times a day (BID) | ORAL | Status: DC

## 2023-02-17 MED ORDER — ROCURONIUM BROMIDE 100 MG/10ML IV SOLN
INTRAVENOUS | Status: DC | PRN
  Administered 2023-02-17: 20 mg via INTRAVENOUS
  Administered 2023-02-17: 30 mg via INTRAVENOUS
  Administered 2023-02-17: 20 mg via INTRAVENOUS

## 2023-02-17 MED ORDER — IOHEXOL 300 MG/ML  SOLN
150.0000 mL | Freq: Once | INTRAMUSCULAR | Status: AC | PRN
  Administered 2023-02-17: 100 mL via INTRA_ARTERIAL

## 2023-02-17 MED ORDER — ETOMIDATE 2 MG/ML IV SOLN
INTRAVENOUS | Status: AC | PRN
  Administered 2023-02-17: 20 mg via INTRAVENOUS

## 2023-02-17 MED ORDER — IPRATROPIUM-ALBUTEROL 0.5-2.5 (3) MG/3ML IN SOLN
3.0000 mL | RESPIRATORY_TRACT | Status: DC | PRN
  Administered 2023-03-01 – 2023-03-02 (×2): 3 mL via RESPIRATORY_TRACT
  Filled 2023-02-17 (×2): qty 3

## 2023-02-17 MED ORDER — PHENYLEPHRINE 80 MCG/ML (10ML) SYRINGE FOR IV PUSH (FOR BLOOD PRESSURE SUPPORT)
PREFILLED_SYRINGE | INTRAVENOUS | Status: DC | PRN
  Administered 2023-02-17 (×4): 80 ug via INTRAVENOUS

## 2023-02-17 MED ORDER — ROCURONIUM BROMIDE 50 MG/5ML IV SOLN
INTRAVENOUS | Status: AC | PRN
Start: 2023-02-17 — End: 2023-02-17
  Administered 2023-02-17: 100 mg via INTRAVENOUS

## 2023-02-17 MED ORDER — TICAGRELOR 60 MG PO TABS
ORAL_TABLET | ORAL | Status: AC | PRN
  Administered 2023-02-17: 90 mg

## 2023-02-17 MED ORDER — SODIUM CHLORIDE 0.9 % IV SOLN
INTRAVENOUS | Status: AC | PRN
  Administered 2023-02-17: 2 ug/kg/min via INTRAVENOUS

## 2023-02-17 MED ORDER — SODIUM CHLORIDE 0.9 % IV SOLN
INTRAVENOUS | Status: DC | PRN
Start: 2023-02-17 — End: 2023-02-17

## 2023-02-17 MED ORDER — NITROGLYCERIN 1 MG/10 ML FOR IR/CATH LAB
INTRA_ARTERIAL | Status: AC | PRN
  Administered 2023-02-17 (×2): 25 ug via INTRA_ARTERIAL

## 2023-02-17 MED ORDER — OSELTAMIVIR PHOSPHATE 75 MG PO CAPS: 75.0000 mg | ORAL_CAPSULE | Freq: Two times a day (BID) | ORAL | Status: DC

## 2023-02-17 MED ORDER — CEFAZOLIN SODIUM-DEXTROSE 2-3 GM-%(50ML) IV SOLR
INTRAVENOUS | Status: DC | PRN
  Administered 2023-02-17: 2 g via INTRAVENOUS

## 2023-02-17 MED ORDER — NOREPINEPHRINE 4 MG/250ML-% IV SOLN
INTRAVENOUS | Status: AC
  Administered 2023-02-17: 4 mg via INTRAVENOUS
  Filled 2023-02-17: qty 250

## 2023-02-17 MED ORDER — SENNOSIDES-DOCUSATE SODIUM 8.6-50 MG PO TABS
1.0000 | ORAL_TABLET | Freq: Every evening | ORAL | Status: DC | PRN
  Administered 2023-02-18: 1 via ORAL
  Filled 2023-02-17: qty 1

## 2023-02-17 MED ORDER — OSELTAMIVIR PHOSPHATE 30 MG PO CAPS
30.0000 mg | ORAL_CAPSULE | Freq: Two times a day (BID) | ORAL | Status: DC
  Administered 2023-02-17 – 2023-02-18 (×3): 30 mg
  Filled 2023-02-17 (×4): qty 1

## 2023-02-17 MED ORDER — SODIUM CHLORIDE 0.9 % IV SOLN
500.0000 mg | INTRAVENOUS | Status: DC
  Administered 2023-02-18 – 2023-02-20 (×4): 500 mg via INTRAVENOUS
  Filled 2023-02-17 (×5): qty 5

## 2023-02-17 MED ORDER — EPINEPHRINE 1 MG/10ML IJ SOSY
PREFILLED_SYRINGE | INTRAMUSCULAR | Status: AC
  Filled 2023-02-17: qty 10

## 2023-02-17 MED ORDER — INSULIN ASPART 100 UNIT/ML IJ SOLN
0.0000 [IU] | INTRAMUSCULAR | Status: DC
  Administered 2023-02-17: 3 [IU] via SUBCUTANEOUS
  Administered 2023-02-17: 2 [IU] via SUBCUTANEOUS
  Administered 2023-02-18 (×2): 3 [IU] via SUBCUTANEOUS

## 2023-02-17 MED ORDER — ACETAMINOPHEN 650 MG RE SUPP: 650.0000 mg | RECTAL | Status: DC | PRN

## 2023-02-17 MED ORDER — CHLORHEXIDINE GLUCONATE CLOTH 2 % EX PADS
6.0000 | MEDICATED_PAD | Freq: Every day | CUTANEOUS | Status: DC
  Administered 2023-02-17 – 2023-03-02 (×13): 6 via TOPICAL

## 2023-02-17 MED ORDER — SODIUM CHLORIDE 0.9 % IV SOLN
200.0000 mg | Freq: Once | INTRAVENOUS | Status: AC
  Administered 2023-02-18: 200 mg via INTRAVENOUS
  Filled 2023-02-17: qty 40

## 2023-02-17 MED ORDER — CLEVIDIPINE BUTYRATE 0.5 MG/ML IV EMUL
0.0000 mg/h | INTRAVENOUS | Status: DC
  Administered 2023-02-17: 10 mg/h via INTRAVENOUS
  Administered 2023-02-17: 8 mg/h via INTRAVENOUS
  Administered 2023-02-19: 28 mg/h via INTRAVENOUS
  Administered 2023-02-19: 2 mg/h via INTRAVENOUS
  Administered 2023-02-19: 4 mg/h via INTRAVENOUS
  Administered 2023-02-19: 14 mg/h via INTRAVENOUS
  Filled 2023-02-17: qty 50
  Filled 2023-02-17 (×2): qty 100
  Filled 2023-02-17 (×3): qty 50

## 2023-02-17 MED ORDER — LACTATED RINGERS IV BOLUS
500.0000 mL | Freq: Once | INTRAVENOUS | Status: AC
  Administered 2023-02-17: 500 mL via INTRAVENOUS

## 2023-02-17 MED ORDER — CANGRELOR BOLUS VIA INFUSION
INTRAVENOUS | Status: AC | PRN
  Administered 2023-02-17: 1249.5 ug via INTRAVENOUS

## 2023-02-17 MED ORDER — FENTANYL CITRATE PF 50 MCG/ML IJ SOSY: 25.0000 ug | PREFILLED_SYRINGE | INTRAMUSCULAR | Status: DC | PRN

## 2023-02-17 MED ORDER — ASPIRIN 81 MG PO CHEW
CHEWABLE_TABLET | ORAL | Status: AC | PRN
  Administered 2023-02-17: 81 mg

## 2023-02-17 MED ORDER — ATROPINE SULFATE 1 MG/ML IV SOLN: 0.4000 mg | Freq: Once | INTRAVENOUS | Status: DC | PRN

## 2023-02-17 MED ORDER — CLEVIDIPINE BUTYRATE 0.5 MG/ML IV EMUL
INTRAVENOUS | Status: AC
  Administered 2023-02-17: 2 mg/h via INTRAVENOUS
  Filled 2023-02-17: qty 100

## 2023-02-17 MED ORDER — ACETAMINOPHEN 160 MG/5ML PO SOLN
650.0000 mg | ORAL | Status: DC | PRN
  Administered 2023-02-19 – 2023-02-22 (×3): 650 mg
  Filled 2023-02-17 (×3): qty 20.3

## 2023-02-17 MED ORDER — TICAGRELOR 90 MG PO TABS
ORAL_TABLET | ORAL | Status: AC
  Filled 2023-02-17: qty 2

## 2023-02-17 MED ORDER — ALBUTEROL SULFATE (2.5 MG/3ML) 0.083% IN NEBU: 2.5000 mg | INHALATION_SOLUTION | RESPIRATORY_TRACT | Status: DC | PRN

## 2023-02-17 MED ORDER — SODIUM CHLORIDE 0.9 % IV SOLN
250.0000 mL | INTRAVENOUS | Status: DC
  Administered 2023-02-17 – 2023-02-20 (×2): 250 mL via INTRAVENOUS

## 2023-02-17 MED ORDER — ORAL CARE MOUTH RINSE
15.0000 mL | OROMUCOSAL | Status: DC
  Administered 2023-02-17 – 2023-02-18 (×7): 15 mL via OROMUCOSAL

## 2023-02-17 MED ORDER — SODIUM CHLORIDE 0.9 % IV SOLN
100.0000 mg | Freq: Every day | INTRAVENOUS | Status: AC
  Administered 2023-02-18 – 2023-02-19 (×2): 100 mg via INTRAVENOUS
  Filled 2023-02-17 (×2): qty 20

## 2023-02-17 MED ORDER — TENECTEPLASE FOR STROKE
0.2500 mg/kg | PACK | Freq: Once | INTRAVENOUS | Status: AC
  Administered 2023-02-17: 21 mg via INTRAVENOUS
  Filled 2023-02-17: qty 10

## 2023-02-17 MED ORDER — ACETAMINOPHEN 325 MG PO TABS
650.0000 mg | ORAL_TABLET | ORAL | Status: DC | PRN
  Filled 2023-02-17 (×2): qty 2

## 2023-02-17 MED ORDER — IOHEXOL 300 MG/ML  SOLN: 50.0000 mL | Freq: Once | INTRAMUSCULAR | Status: DC | PRN

## 2023-02-17 MED ORDER — CEFAZOLIN SODIUM-DEXTROSE 2-4 GM/100ML-% IV SOLN
INTRAVENOUS | Status: AC
  Filled 2023-02-17: qty 100

## 2023-02-17 MED ORDER — EPHEDRINE SULFATE (PRESSORS) 50 MG/ML IJ SOLN
INTRAMUSCULAR | Status: DC | PRN
Start: 2023-02-17 — End: 2023-02-17
  Administered 2023-02-17 (×3): 5 mg via INTRAVENOUS

## 2023-02-17 MED ORDER — ASPIRIN 325 MG PO TABS
ORAL_TABLET | ORAL | Status: AC
  Filled 2023-02-17: qty 1

## 2023-02-17 MED ORDER — IOHEXOL 300 MG/ML  SOLN
150.0000 mL | Freq: Once | INTRAMUSCULAR | Status: AC | PRN
  Administered 2023-02-17: 50 mL via INTRA_ARTERIAL

## 2023-02-17 MED ORDER — POLYETHYLENE GLYCOL 3350 17 G PO PACK
17.0000 g | PACK | Freq: Every day | ORAL | Status: DC
  Administered 2023-02-17 – 2023-02-19 (×2): 17 g
  Filled 2023-02-17 (×2): qty 1

## 2023-02-17 MED ORDER — STROKE: EARLY STAGES OF RECOVERY BOOK
Freq: Once | Status: AC
  Filled 2023-02-17: qty 1

## 2023-02-17 MED ORDER — HYDRALAZINE HCL 20 MG/ML IJ SOLN
10.0000 mg | Freq: Once | INTRAMUSCULAR | Status: AC
  Administered 2023-02-17: 10 mg via INTRAVENOUS

## 2023-02-17 MED ORDER — CANGRELOR TETRASODIUM 50 MG IV SOLR
INTRAVENOUS | Status: AC
  Filled 2023-02-17: qty 50

## 2023-02-17 MED ORDER — SODIUM CHLORIDE 0.9 % IV SOLN
2.0000 g | INTRAVENOUS | Status: DC
  Administered 2023-02-17 – 2023-02-19 (×3): 2 g via INTRAVENOUS
  Filled 2023-02-17 (×3): qty 20

## 2023-02-17 NOTE — Progress Notes (Signed)
Pt transported from IR to 2M09 without complications

## 2023-02-17 NOTE — ED Triage Notes (Signed)
Pt LKW at 1445. Pt has sudden onset of L side weakness, facial droop, and aphasia. Code stroke called at this time.   Per family pt has been sick for a couple weeks prior and dx with COVID today.

## 2023-02-17 NOTE — ED Provider Notes (Signed)
[facility] Provider Note  MDM   HPI/ROS:  Xavier [name] is a 82 y.o. male with history of diabetes, hypertension presenting to the emergency department out of concern for acute stroke.  Patient experienced sudden onset left-sided weakness, left-sided facial droop, and significant speech difficulty this afternoon approximately 45 minutes prior to arrival.  These deficits presented after a syncopal episode and called her earlier today.  Patient was sitting in a car and lost consciousness and became diaphoretic somnolent.  Last known normal around 245 this afternoon.  Of note, tested positive for COVID and flu yesterday.  Physical exam is notable for: - Ill-appearing, diaphoretic, GCS 12 - Neurological exam significant for aphasia with pronounced left-sided hemibody weakness and left-sided facial droop  On my initial evaluation, patient is:  -Somnolent, diaphoretic agonal respirations and oxygen saturations in the 70s.  Additional vital signs concerning for hypertension with systolic pressures in the 180s and a heart rate in the 40s to 50s.  Code stroke was paged out immediately upon initial evaluation in triage.  Given the patient's concerning mental status with initial oxygen sats in the 70s, patient was taken emergently from triage to ED bed for management of airway prior to CT imaging.  Please see linked procedure notes for further details.  After successful intubation, patient was initiated on propofol drip for sedation tube placement was confirmed by bedside chest x-ray.  Patient was then taken emergently to the CT scanner for stroke imaging.  Stroke imaging revealed acute M1 occlusion.  Patient was taken to IR for further management.  Please see neurology and IR notes for further details.  Disposition:  I discussed the case with neurology who graciously agreed to admit the patient to their service for continued care.   Clinical Impression:   1. Acute ischemic stroke [facility])     Rx / DC Orders ED Discharge Orders     None       The plan for this patient was discussed with Dr. Silverio Lay, who voiced agreement and who oversaw evaluation and treatment of this patient.   Clinical Complexity A medically appropriate history, review of systems, and physical exam was performed.  My independent interpretations of EKG, labs, and radiology are documented in the ED course above.   Click here for ABCD2, HEART and other calculatorsREFRESH Note before signing   Patient's presentation is most consistent with acute presentation with potential threat to life or bodily function.  Medical Decision Making Amount and/or Complexity of Data Reviewed Labs: ordered. Radiology: ordered.  Risk Prescription drug management. Decision regarding hospitalization.    HPI/ROS      See MDM section for pertinent HPI and ROS. A complete ROS was performed with pertinent positives/negatives noted above.   Past Medical History:  Diagnosis Date   Congenital ureterocele    Diabetes mellitus without complication (HCC)    Hypertension    Prostate cancer [facility])     Past Surgical History:  Procedure Laterality Date   COLONOSCOPY W/ POLYPECTOMY     EYE SURGERY        Physical Exam   Vitals:   02/17/23 2318 02/17/23 2330 02/18/23 0000 02/18/23 0030  BP:  (!) 103/54 (!) 127/55 (!) 118/54  Pulse:  (!) 46 (!) 54 (!) 53  Resp:  20 20 20   Temp: (!) 93 F (33.9 C)  (!) 94.4 F (34.7 C) (!) 95.5 F (35.3 C)  TempSrc: Esophageal  Esophageal Esophageal  SpO2:  96% 97% 98%  Weight:      Height:        Physical Exam Vitals and nursing note reviewed.  Constitutional:      General: He is in acute distress.     Appearance: He is well-developed. He is ill-appearing, toxic-appearing and diaphoretic.  HENT:     Head: Normocephalic and atraumatic.     Nose: Congestion present.     Mouth/Throat:     Mouth: Mucous membranes are moist.     Comments:  Gurgling respirations Eyes:     Conjunctiva/sclera: Conjunctivae normal.     Pupils: Pupils are equal, round, and reactive to light.  Cardiovascular:     Rate and Rhythm: Regular rhythm. Bradycardia present.  Pulmonary:     Effort: Respiratory distress present.  Abdominal:     Palpations: Abdomen is soft.     Tenderness: There is no abdominal tenderness.  Musculoskeletal:     Cervical back: Neck supple.  Skin:    General: Skin is warm.  Neurological:     Mental Status: He is disoriented.     Cranial Nerves: Cranial nerve deficit present.     Motor: Weakness present.  Psychiatric:        Mood and Affect: Mood normal.      Procedures    Procedure Name: Intubation Date/Time: 02/18/2023 12:47 AM  Performed by: Dyanne Iha, MDPre-anesthesia Checklist: Patient identified, Emergency Drugs available, Suction available, Timeout performed and Patient being monitored Oxygen Delivery Method: Non-rebreather mask Preoxygenation: Pre-oxygenation with 100% oxygen Induction Type: Rapid sequence Laryngoscope Size: Mac, Glidescope and 3 Grade View: Grade I Tube size: 7.5 mm Number of attempts: 1 Airway Equipment and Method: Bougie stylet Placement Confirmation: ETT inserted through vocal cords under direct vision, Positive ETCO2, CO2 detector and Breath sounds checked- equal and bilateral Secured at: 24 cm Tube secured with: ETT holder     Starleen Arms, MD Department of Emergency Medicine   Please note that this documentation was produced with the assistance of voice-to-text technology and may contain errors.    Dyanne Iha, MD 02/18/23 1610    Charlynne Pander, MD 02/18/23 [phone]

## 2023-02-17 NOTE — ED Notes (Signed)
EDP at pt side. Pt noted to have SpO2 of 78% on R/A. Placed on NRB mask. Pt not stable for CT at this time per EDP. Pt taken to ED room 18.

## 2023-02-17 NOTE — Progress Notes (Signed)
eLink Physician-Brief Progress Note Patient Name: Xavier [name] DOB: 04-12-1941 MRN: [id number]   Date of Service  02/17/2023  HPI/Events of Note  26 M DM (A1C 9), HTN, CKD, prostate CA, flud like symptoms for weeks presented with left facial droop o by unresponsiveness. SBP 208. Intubated in the ED. CTA with right M1 occlusion. TNK given/ IR thrombectomy.  eICU Interventions  Intubated for airway protection BP and glucose control     Intervention Category Evaluation Type: New Patient Evaluation  Darl Pikes 02/17/2023, 8:22 PM

## 2023-02-17 NOTE — Transfer of Care (Signed)
Immediate Anesthesia Transfer of Care Note  Patient: Xavier [name]  Procedure(s) Performed: IR WITH ANESTHESIA  Patient Location: ICU  Anesthesia Type:General  Level of Consciousness: sedated and Patient remains intubated per anesthesia plan  Airway & Oxygen Therapy: Patient remains intubated per anesthesia plan and Patient placed on Ventilator (see vital sign flow sheet for setting)  Post-op Assessment: Report given to RN and Post -op Vital signs reviewed and stable  Post vital signs: Reviewed and stable  Last Vitals:  Vitals Value Taken Time  BP 125/46 02/17/23 2015  Temp    Pulse 50 02/17/23 2027  Resp 13 02/17/23 2027  SpO2 100 % 02/17/23 2027  Vitals shown include unfiled device data.  Last Pain:  Vitals:   02/17/23 1535  TempSrc: Oral         Complications: There were no known notable events for this encounter.

## 2023-02-17 NOTE — Progress Notes (Signed)
 An USGPIV (ultrasound guided PIV) has been placed for short-term vasopressor infusion. A correctly placed ivWatch must be used when administering Vasopressors. Should this treatment be needed beyond 24 hours, central line access should be obtained.  It will be the responsibility of the bedside nurse to follow best practice to prevent extravasations.

## 2023-02-17 NOTE — Code Documentation (Signed)
Stroke Response Nurse Documentation Code Documentation  Xavier [name] is a 82 y.o. male arriving to [facility]  via [employer] on 02/17/2023 with past medical hx of hypertension, diabetes and prostate cancer. On No antithrombotic. Code stroke was activated by ED.   Patient has not been feeling well for weeks. Diagnosed by PCP today with Covid and influenzae. While a passenger in car with wife, she noted he was diaphoretic and developed a sudden facial droop then became unresponsive.   Stroke team met patient in ED. Patient's oxygen sats in 70s. Placed on nonrebreather mask. Taken to room for intubation. Once cleared for CT by EDP,  patient to CT with team. NIHSS 16 prior to intubation, see documentation for details and code stroke times. Patient with disoriented, right gaze preference , left hemianopia, left facial droop, bilateral leg weakness, left decreased sensation, and Sensory  neglect on exam. The following imaging was completed:  CT Head and CTA. Patient is a candidate for IV Thrombolytic. SBP elevated. Cleviprex gtt started. TNK given at 1650 once BP reduced. BP elevated after TNK given and cleviprex gtt increased. Max dose reached and prn given which reduced SBP to 160. Patient taken to IR.  Care Plan: admit to ICU for post TNK/IR care.   Bedside handoff with IR RN Herbert Seta.    Ferman Hamming Stroke Response RN

## 2023-02-17 NOTE — Progress Notes (Signed)
eLink Physician-Brief Progress Note Patient Name: Xavier [name] DOB: 01/15/1941 MRN: [id number]   Date of Service  02/17/2023  HPI/Events of Note  Notified of increasing pressor and bradycardia in the 30s On norepinephrine and phenylephrine Also noted have unequal pupils not noted previously  eICU Interventions  He is due for reimaging. Clinical picture concerning for Cushing's reflex Imaging is being arranged now and patient to be brought down once more stable. Discussed with BSRN Will order standby atropine Bedside CCM team aware     Intervention Category Intermediate Interventions: Hypertension - evaluation and management;Other:  Darl Pikes 02/17/2023, 8:56 PM

## 2023-02-17 NOTE — Progress Notes (Signed)
PHARMACIST CODE STROKE RESPONSE  Notified to mix TNK at 1641 by Dr. Viviann Spare 1641 BP above goal for TNK 1645 clevidipine started (labetalol contraindicated 2/2 to HR low 50s TNK preparation completed at 1646 1650 BP ok to give TNK >> TNK given  TNK dose = 21 mg IV over 5 seconds  Issues/delays encountered (if applicable): Initially told no TNK but further imaging discovered stroke so called back for TNK administration prior going to IR. This pharmacist was in CT with another stroke at this time. I immediately left that stroke for TNK preparation for this patient and aided in hypertension management for TNK administration.  Cathie Hoops 02/17/23 4:51 PM

## 2023-02-17 NOTE — Progress Notes (Signed)
eLink Physician-Brief Progress Note Patient Name: Xavier [name] DOB: 1940-08-23 MRN: [id number]   Date of Service  02/17/2023  HPI/Events of Note  BP labile switching from Cleviprex and norepinephrine  Head CT without acute changes  eICU Interventions  Ordered a trial of 500 cc LR bolus     Intervention Category Intermediate Interventions: Hypertension - evaluation and management;Hypotension - evaluation and management  Darl Pikes 02/17/2023, 11:25 PM

## 2023-02-17 NOTE — Sedation Documentation (Signed)
Care per anesthesia at this time. Arese, CRNA at bedside. See CRNA documentation.

## 2023-02-17 NOTE — Procedures (Signed)
INR.  Status post four-vessel cerebral arteriogram.  Right CFA approach.  Findings.  1.  Occluded right middle cerebral artery proximal M1 segment. 2.  Occlusion of the right posterior cerebral artery P1 P2 junction. 3 90% plus stenosis of the left internal carotid artery supraclinoid segment. 4.  Occluded right vertebral artery at the level of the right posterior inferior cerebellar artery.  Status post complete revascularization of occluded right middle cerebral artery with 1 pass with 055 zoom complex aspiration achieving aTICI 3 revascularization.Marland Kitchen  Reocclusion due to underlying severe atherosclerosis requiring revascularization with stent assisted angioplasty using a 2 x 15 mm Onyx balloon mandible stent with return toTICI 3 revascularization  Revascularization of the right posterior cerebral P1 P2 junction with mechanical thrombolysis with an O2 8 microguidewire.  Post CT of the brain demonstrating patchy focal areas of hyperattenuation in the right parietal occipital region contrast staining versus subarachnoid hemorrhage/petechial hemorrhages.  8 French Angio-Seal closure device deployed at the right groin puncture site for hemostasis.  Distal pulses only right posterior tibial barely dopplerable unchanged from prior to the procedure.  No other pulses in the feet were dopplerable prior to the procedure.  Medications .given 81 aspirin and Brilinta 90 mg via orogastric tube prior to stent placement.  Patient also given half bolus dose of IV cangrelor with at 1 hour infusion of the half dose of IV cangrelor which was stopped.  Follow-up CT brain without infusion at 2043.  Hours  Patient left intubated due to COVID-19 positivity.  Xavier Sanger MD.

## 2023-02-17 NOTE — Progress Notes (Signed)
Admission assessment complete.  Pupillary changes noted on L eye.  Oval/oblong in shape and non-reactive.  Right eye pinpoint non-reactive.  Pt sedated.  Pt also bradycardic in 40's.  CCM notified.  BLE cold and unable to doppler pulses.  MD aware.

## 2023-02-17 NOTE — Progress Notes (Signed)
Follow up CT head completed and personally reviewed. I agree with the official Radiology report conclusions, as follows: Unchanged appearance of posterior right MCA territory infarct. Hyperdensity over the posterior right hemisphere, possibly contrast staining.   Electronically signed: Dr. Caryl Pina

## 2023-02-17 NOTE — Progress Notes (Signed)
Smart watch given to wife

## 2023-02-17 NOTE — Progress Notes (Signed)
Patient transported to CT and back without any complications.  

## 2023-02-17 NOTE — Progress Notes (Signed)
   02/17/23 1550  Spiritual Encounters  Type of Visit Initial  Care provided to: Family  Referral source Nurse (RN/NT/LPN)  Reason for visit Trauma  OnCall Visit No  Spiritual Framework  Presenting Themes Impactful experiences and emotions  Patient Stress Factors None identified  Family Stress Factors Other (Comment) (Pt spouse was crying)  Interventions  Spiritual Care Interventions Made Compassionate presence;Normalization of emotions;Prayer   Prayed with pt's spouse.  Chaplain Intern: Paul Dykes. Christell Constant

## 2023-02-17 NOTE — H&P (Addendum)
43   Temp 97.8 F (36.6 C) (Oral)   Resp 20   Ht 6\' 2"  (1.88 m)   SpO2 100%   BMI 23.57 kg/m  Vital signs in last 24 hours: Temp:  [97.8 F (36.6 C)] 97.8 F (36.6 C) (08/09 1535) Pulse Rate:  [27-66] 43 (08/09 1703) Resp:  [19-20] 20 (08/09 1703) BP: (135-240)/(63-99) 208/67 (08/09 1700) SpO2:  [78 %-100 %] 100 % (08/09 1703) FiO2 (%):  [50 %-100 %] 50 % (08/09 1657)  GENERAL: Awake, alert, ill-appearing African American male Psych: Patient is cooperative with examination prior to intubation  Head: Normocephalic and atraumatic, without obvious abnormality EENT: Normal conjunctivae, dry mucous membranes, no OP obstruction LUNGS: Initially patient on nonrebreather and was intubated emergently for airway protection.  CV: Regular rate and rhythm on telemetry ABDOMEN: Soft, non-tender, non-distended Extremities: Warm, well perfused, chronic left foot drop     NEURO: Exam limited as he was immediately brought to ED room for intubation for airway protection and hypoxic respiratory failure 2/2 COVID and influenza.  Mental Status: Awake, alert, and oriented to self, place, and situation.  There is no evidence of aphasia or dysarthria on exam.  Patient follows simple commands.  No neglect is noted Cranial Nerves:  II: PERRL. visual fields full.  III, IV, VI: EOMI without gaze preference, ptosis, or nystagmus V: Sensation is intact to light touch and symmetrical to face.  VII: Face is symmetric resting and with movement VIII: Hearing intact to voice IX, X: Phonation intact XI: Normal sternocleidomastoid and trapezius muscle strength XII: Tongue protrudes midline   Motor: Bilateral upper extremities elevate antigravity without vertical drift. Right lower extremity elevates antigravity without vertical drift. Left lower extremity weakness noted; LLE moves slightly without gravity (some chronic LLE weakness and footdrop is present per patient's wife at bedside) Tone is normal. Bulk is normal.  Sensation: Intact to light touch bilaterally in all four extremities.  Gait: Deferred for patient safety   NIHSS: 1a Level of Conscious.: 0 1b LOC Questions: 0 1c LOC Commands: 1 2 Best Gaze: 0 3 Visual: 0 4 Facial Palsy: 0 5a Motor Arm - left: 0. Initially no movement, then was able to lift it off the bed without drift prior to intubation. 5b Motor Arm - Right: 0 6a Motor Leg - Left: 3 6b Motor Leg - Right: 0 7 Limb Ataxia: 0 8 Sensory: 0 9 Best Language: 1 10 Dysarthria: 0 11 Extinct. and Inatten.: 0 TOTAL: 5  Labs I have reviewed labs in epic and the results pertinent to this consultation are: CBC    Component Value Date/Time   WBC 8.6 03/09/2023 1550   RBC 4.82 03/10/2023 1550   HGB 12.2 (L) 02/26/2023 1650   HCT 36.0 (L) 03/07/2023 1650   PLT 220 03/10/2023 1550   MCV 83.6 02/11/2023 1550   MCH 26.8 02/11/2023 1550   MCHC 32.0  02/20/2023 1550   RDW 12.8 03/10/2023 1550   LYMPHSABS 2.0 03/06/2023 1550   MONOABS 0.6 02/18/2023 1550   EOSABS 0.1 02/28/2023 1550   BASOSABS 0.1 02/11/2023 1550   CMP     Component Value Date/Time   NA 136 02/26/2023 1650   K 4.4 03/09/2023 1650   CL 100 03/04/2023 1550   CO2 27 02/14/2023 1550   GLUCOSE 232 (H) 02/14/2023 1550   BUN 24 (H) 02/19/2023 1550   CREATININE 2.00 (H) 02/24/2023 1550   CALCIUM 9.2 02/11/2023 1550   PROT 7.1 02/25/2023 1550   ALBUMIN 3.5  43   Temp 97.8 F (36.6 C) (Oral)   Resp 20   Ht 6\' 2"  (1.88 m)   SpO2 100%   BMI 23.57 kg/m  Vital signs in last 24 hours: Temp:  [97.8 F (36.6 C)] 97.8 F (36.6 C) (08/09 1535) Pulse Rate:  [27-66] 43 (08/09 1703) Resp:  [19-20] 20 (08/09 1703) BP: (135-240)/(63-99) 208/67 (08/09 1700) SpO2:  [78 %-100 %] 100 % (08/09 1703) FiO2 (%):  [50 %-100 %] 50 % (08/09 1657)  GENERAL: Awake, alert, ill-appearing African American male Psych: Patient is cooperative with examination prior to intubation  Head: Normocephalic and atraumatic, without obvious abnormality EENT: Normal conjunctivae, dry mucous membranes, no OP obstruction LUNGS: Initially patient on nonrebreather and was intubated emergently for airway protection.  CV: Regular rate and rhythm on telemetry ABDOMEN: Soft, non-tender, non-distended Extremities: Warm, well perfused, chronic left foot drop     NEURO: Exam limited as he was immediately brought to ED room for intubation for airway protection and hypoxic respiratory failure 2/2 COVID and influenza.  Mental Status: Awake, alert, and oriented to self, place, and situation.  There is no evidence of aphasia or dysarthria on exam.  Patient follows simple commands.  No neglect is noted Cranial Nerves:  II: PERRL. visual fields full.  III, IV, VI: EOMI without gaze preference, ptosis, or nystagmus V: Sensation is intact to light touch and symmetrical to face.  VII: Face is symmetric resting and with movement VIII: Hearing intact to voice IX, X: Phonation intact XI: Normal sternocleidomastoid and trapezius muscle strength XII: Tongue protrudes midline   Motor: Bilateral upper extremities elevate antigravity without vertical drift. Right lower extremity elevates antigravity without vertical drift. Left lower extremity weakness noted; LLE moves slightly without gravity (some chronic LLE weakness and footdrop is present per patient's wife at bedside) Tone is normal. Bulk is normal.  Sensation: Intact to light touch bilaterally in all four extremities.  Gait: Deferred for patient safety   NIHSS: 1a Level of Conscious.: 0 1b LOC Questions: 0 1c LOC Commands: 1 2 Best Gaze: 0 3 Visual: 0 4 Facial Palsy: 0 5a Motor Arm - left: 0. Initially no movement, then was able to lift it off the bed without drift prior to intubation. 5b Motor Arm - Right: 0 6a Motor Leg - Left: 3 6b Motor Leg - Right: 0 7 Limb Ataxia: 0 8 Sensory: 0 9 Best Language: 1 10 Dysarthria: 0 11 Extinct. and Inatten.: 0 TOTAL: 5  Labs I have reviewed labs in epic and the results pertinent to this consultation are: CBC    Component Value Date/Time   WBC 8.6 03/01/2023 1550   RBC 4.82 03/08/2023 1550   HGB 12.2 (L) 02/19/2023 1650   HCT 36.0 (L) 02/22/2023 1650   PLT 220 02/22/2023 1550   MCV 83.6 02/24/2023 1550   MCH 26.8 02/14/2023 1550   MCHC 32.0  02/25/2023 1550   RDW 12.8 03/01/2023 1550   LYMPHSABS 2.0 03/08/2023 1550   MONOABS 0.6 03/06/2023 1550   EOSABS 0.1 03/08/2023 1550   BASOSABS 0.1 03/08/2023 1550   CMP     Component Value Date/Time   NA 136 02/26/2023 1650   K 4.4 03/04/2023 1650   CL 100 03/06/2023 1550   CO2 27 02/27/2023 1550   GLUCOSE 232 (H) 02/25/2023 1550   BUN 24 (H) 02/11/2023 1550   CREATININE 2.00 (H) 02/13/2023 1550   CALCIUM 9.2 02/13/2023 1550   PROT 7.1 02/18/2023 1550   ALBUMIN 3.5  43   Temp 97.8 F (36.6 C) (Oral)   Resp 20   Ht 6\' 2"  (1.88 m)   SpO2 100%   BMI 23.57 kg/m  Vital signs in last 24 hours: Temp:  [97.8 F (36.6 C)] 97.8 F (36.6 C) (08/09 1535) Pulse Rate:  [27-66] 43 (08/09 1703) Resp:  [19-20] 20 (08/09 1703) BP: (135-240)/(63-99) 208/67 (08/09 1700) SpO2:  [78 %-100 %] 100 % (08/09 1703) FiO2 (%):  [50 %-100 %] 50 % (08/09 1657)  GENERAL: Awake, alert, ill-appearing African American male Psych: Patient is cooperative with examination prior to intubation  Head: Normocephalic and atraumatic, without obvious abnormality EENT: Normal conjunctivae, dry mucous membranes, no OP obstruction LUNGS: Initially patient on nonrebreather and was intubated emergently for airway protection.  CV: Regular rate and rhythm on telemetry ABDOMEN: Soft, non-tender, non-distended Extremities: Warm, well perfused, chronic left foot drop     NEURO: Exam limited as he was immediately brought to ED room for intubation for airway protection and hypoxic respiratory failure 2/2 COVID and influenza.  Mental Status: Awake, alert, and oriented to self, place, and situation.  There is no evidence of aphasia or dysarthria on exam.  Patient follows simple commands.  No neglect is noted Cranial Nerves:  II: PERRL. visual fields full.  III, IV, VI: EOMI without gaze preference, ptosis, or nystagmus V: Sensation is intact to light touch and symmetrical to face.  VII: Face is symmetric resting and with movement VIII: Hearing intact to voice IX, X: Phonation intact XI: Normal sternocleidomastoid and trapezius muscle strength XII: Tongue protrudes midline   Motor: Bilateral upper extremities elevate antigravity without vertical drift. Right lower extremity elevates antigravity without vertical drift. Left lower extremity weakness noted; LLE moves slightly without gravity (some chronic LLE weakness and footdrop is present per patient's wife at bedside) Tone is normal. Bulk is normal.  Sensation: Intact to light touch bilaterally in all four extremities.  Gait: Deferred for patient safety   NIHSS: 1a Level of Conscious.: 0 1b LOC Questions: 0 1c LOC Commands: 1 2 Best Gaze: 0 3 Visual: 0 4 Facial Palsy: 0 5a Motor Arm - left: 0. Initially no movement, then was able to lift it off the bed without drift prior to intubation. 5b Motor Arm - Right: 0 6a Motor Leg - Left: 3 6b Motor Leg - Right: 0 7 Limb Ataxia: 0 8 Sensory: 0 9 Best Language: 1 10 Dysarthria: 0 11 Extinct. and Inatten.: 0 TOTAL: 5  Labs I have reviewed labs in epic and the results pertinent to this consultation are: CBC    Component Value Date/Time   WBC 8.6 03/01/2023 1550   RBC 4.82 03/08/2023 1550   HGB 12.2 (L) 02/19/2023 1650   HCT 36.0 (L) 02/22/2023 1650   PLT 220 02/22/2023 1550   MCV 83.6 02/24/2023 1550   MCH 26.8 02/14/2023 1550   MCHC 32.0  02/25/2023 1550   RDW 12.8 03/01/2023 1550   LYMPHSABS 2.0 03/08/2023 1550   MONOABS 0.6 03/06/2023 1550   EOSABS 0.1 03/08/2023 1550   BASOSABS 0.1 03/08/2023 1550   CMP     Component Value Date/Time   NA 136 02/26/2023 1650   K 4.4 03/04/2023 1650   CL 100 03/06/2023 1550   CO2 27 02/27/2023 1550   GLUCOSE 232 (H) 02/25/2023 1550   BUN 24 (H) 02/11/2023 1550   CREATININE 2.00 (H) 02/13/2023 1550   CALCIUM 9.2 02/13/2023 1550   PROT 7.1 02/18/2023 1550   ALBUMIN 3.5  43   Temp 97.8 F (36.6 C) (Oral)   Resp 20   Ht 6\' 2"  (1.88 m)   SpO2 100%   BMI 23.57 kg/m  Vital signs in last 24 hours: Temp:  [97.8 F (36.6 C)] 97.8 F (36.6 C) (08/09 1535) Pulse Rate:  [27-66] 43 (08/09 1703) Resp:  [19-20] 20 (08/09 1703) BP: (135-240)/(63-99) 208/67 (08/09 1700) SpO2:  [78 %-100 %] 100 % (08/09 1703) FiO2 (%):  [50 %-100 %] 50 % (08/09 1657)  GENERAL: Awake, alert, ill-appearing African American male Psych: Patient is cooperative with examination prior to intubation  Head: Normocephalic and atraumatic, without obvious abnormality EENT: Normal conjunctivae, dry mucous membranes, no OP obstruction LUNGS: Initially patient on nonrebreather and was intubated emergently for airway protection.  CV: Regular rate and rhythm on telemetry ABDOMEN: Soft, non-tender, non-distended Extremities: Warm, well perfused, chronic left foot drop     NEURO: Exam limited as he was immediately brought to ED room for intubation for airway protection and hypoxic respiratory failure 2/2 COVID and influenza.  Mental Status: Awake, alert, and oriented to self, place, and situation.  There is no evidence of aphasia or dysarthria on exam.  Patient follows simple commands.  No neglect is noted Cranial Nerves:  II: PERRL. visual fields full.  III, IV, VI: EOMI without gaze preference, ptosis, or nystagmus V: Sensation is intact to light touch and symmetrical to face.  VII: Face is symmetric resting and with movement VIII: Hearing intact to voice IX, X: Phonation intact XI: Normal sternocleidomastoid and trapezius muscle strength XII: Tongue protrudes midline   Motor: Bilateral upper extremities elevate antigravity without vertical drift. Right lower extremity elevates antigravity without vertical drift. Left lower extremity weakness noted; LLE moves slightly without gravity (some chronic LLE weakness and footdrop is present per patient's wife at bedside) Tone is normal. Bulk is normal.  Sensation: Intact to light touch bilaterally in all four extremities.  Gait: Deferred for patient safety   NIHSS: 1a Level of Conscious.: 0 1b LOC Questions: 0 1c LOC Commands: 1 2 Best Gaze: 0 3 Visual: 0 4 Facial Palsy: 0 5a Motor Arm - left: 0. Initially no movement, then was able to lift it off the bed without drift prior to intubation. 5b Motor Arm - Right: 0 6a Motor Leg - Left: 3 6b Motor Leg - Right: 0 7 Limb Ataxia: 0 8 Sensory: 0 9 Best Language: 1 10 Dysarthria: 0 11 Extinct. and Inatten.: 0 TOTAL: 5  Labs I have reviewed labs in epic and the results pertinent to this consultation are: CBC    Component Value Date/Time   WBC 8.6 03/09/2023 1550   RBC 4.82 03/10/2023 1550   HGB 12.2 (L) 02/26/2023 1650   HCT 36.0 (L) 03/07/2023 1650   PLT 220 03/10/2023 1550   MCV 83.6 02/11/2023 1550   MCH 26.8 02/11/2023 1550   MCHC 32.0  02/20/2023 1550   RDW 12.8 03/10/2023 1550   LYMPHSABS 2.0 03/06/2023 1550   MONOABS 0.6 02/18/2023 1550   EOSABS 0.1 02/28/2023 1550   BASOSABS 0.1 02/11/2023 1550   CMP     Component Value Date/Time   NA 136 02/26/2023 1650   K 4.4 03/09/2023 1650   CL 100 03/04/2023 1550   CO2 27 02/14/2023 1550   GLUCOSE 232 (H) 02/14/2023 1550   BUN 24 (H) 02/19/2023 1550   CREATININE 2.00 (H) 02/24/2023 1550   CALCIUM 9.2 02/11/2023 1550   PROT 7.1 02/25/2023 1550   ALBUMIN 3.5  43   Temp 97.8 F (36.6 C) (Oral)   Resp 20   Ht 6\' 2"  (1.88 m)   SpO2 100%   BMI 23.57 kg/m  Vital signs in last 24 hours: Temp:  [97.8 F (36.6 C)] 97.8 F (36.6 C) (08/09 1535) Pulse Rate:  [27-66] 43 (08/09 1703) Resp:  [19-20] 20 (08/09 1703) BP: (135-240)/(63-99) 208/67 (08/09 1700) SpO2:  [78 %-100 %] 100 % (08/09 1703) FiO2 (%):  [50 %-100 %] 50 % (08/09 1657)  GENERAL: Awake, alert, ill-appearing African American male Psych: Patient is cooperative with examination prior to intubation  Head: Normocephalic and atraumatic, without obvious abnormality EENT: Normal conjunctivae, dry mucous membranes, no OP obstruction LUNGS: Initially patient on nonrebreather and was intubated emergently for airway protection.  CV: Regular rate and rhythm on telemetry ABDOMEN: Soft, non-tender, non-distended Extremities: Warm, well perfused, chronic left foot drop     NEURO: Exam limited as he was immediately brought to ED room for intubation for airway protection and hypoxic respiratory failure 2/2 COVID and influenza.  Mental Status: Awake, alert, and oriented to self, place, and situation.  There is no evidence of aphasia or dysarthria on exam.  Patient follows simple commands.  No neglect is noted Cranial Nerves:  II: PERRL. visual fields full.  III, IV, VI: EOMI without gaze preference, ptosis, or nystagmus V: Sensation is intact to light touch and symmetrical to face.  VII: Face is symmetric resting and with movement VIII: Hearing intact to voice IX, X: Phonation intact XI: Normal sternocleidomastoid and trapezius muscle strength XII: Tongue protrudes midline   Motor: Bilateral upper extremities elevate antigravity without vertical drift. Right lower extremity elevates antigravity without vertical drift. Left lower extremity weakness noted; LLE moves slightly without gravity (some chronic LLE weakness and footdrop is present per patient's wife at bedside) Tone is normal. Bulk is normal.  Sensation: Intact to light touch bilaterally in all four extremities.  Gait: Deferred for patient safety   NIHSS: 1a Level of Conscious.: 0 1b LOC Questions: 0 1c LOC Commands: 1 2 Best Gaze: 0 3 Visual: 0 4 Facial Palsy: 0 5a Motor Arm - left: 0. Initially no movement, then was able to lift it off the bed without drift prior to intubation. 5b Motor Arm - Right: 0 6a Motor Leg - Left: 3 6b Motor Leg - Right: 0 7 Limb Ataxia: 0 8 Sensory: 0 9 Best Language: 1 10 Dysarthria: 0 11 Extinct. and Inatten.: 0 TOTAL: 5  Labs I have reviewed labs in epic and the results pertinent to this consultation are: CBC    Component Value Date/Time   WBC 8.6 03/01/2023 1550   RBC 4.82 03/08/2023 1550   HGB 12.2 (L) 02/19/2023 1650   HCT 36.0 (L) 02/22/2023 1650   PLT 220 02/22/2023 1550   MCV 83.6 02/24/2023 1550   MCH 26.8 02/14/2023 1550   MCHC 32.0  02/25/2023 1550   RDW 12.8 03/01/2023 1550   LYMPHSABS 2.0 03/08/2023 1550   MONOABS 0.6 03/06/2023 1550   EOSABS 0.1 03/08/2023 1550   BASOSABS 0.1 03/08/2023 1550   CMP     Component Value Date/Time   NA 136 02/26/2023 1650   K 4.4 03/04/2023 1650   CL 100 03/06/2023 1550   CO2 27 02/27/2023 1550   GLUCOSE 232 (H) 02/25/2023 1550   BUN 24 (H) 02/11/2023 1550   CREATININE 2.00 (H) 02/13/2023 1550   CALCIUM 9.2 02/13/2023 1550   PROT 7.1 02/18/2023 1550   ALBUMIN 3.5  43   Temp 97.8 F (36.6 C) (Oral)   Resp 20   Ht 6\' 2"  (1.88 m)   SpO2 100%   BMI 23.57 kg/m  Vital signs in last 24 hours: Temp:  [97.8 F (36.6 C)] 97.8 F (36.6 C) (08/09 1535) Pulse Rate:  [27-66] 43 (08/09 1703) Resp:  [19-20] 20 (08/09 1703) BP: (135-240)/(63-99) 208/67 (08/09 1700) SpO2:  [78 %-100 %] 100 % (08/09 1703) FiO2 (%):  [50 %-100 %] 50 % (08/09 1657)  GENERAL: Awake, alert, ill-appearing African American male Psych: Patient is cooperative with examination prior to intubation  Head: Normocephalic and atraumatic, without obvious abnormality EENT: Normal conjunctivae, dry mucous membranes, no OP obstruction LUNGS: Initially patient on nonrebreather and was intubated emergently for airway protection.  CV: Regular rate and rhythm on telemetry ABDOMEN: Soft, non-tender, non-distended Extremities: Warm, well perfused, chronic left foot drop     NEURO: Exam limited as he was immediately brought to ED room for intubation for airway protection and hypoxic respiratory failure 2/2 COVID and influenza.  Mental Status: Awake, alert, and oriented to self, place, and situation.  There is no evidence of aphasia or dysarthria on exam.  Patient follows simple commands.  No neglect is noted Cranial Nerves:  II: PERRL. visual fields full.  III, IV, VI: EOMI without gaze preference, ptosis, or nystagmus V: Sensation is intact to light touch and symmetrical to face.  VII: Face is symmetric resting and with movement VIII: Hearing intact to voice IX, X: Phonation intact XI: Normal sternocleidomastoid and trapezius muscle strength XII: Tongue protrudes midline   Motor: Bilateral upper extremities elevate antigravity without vertical drift. Right lower extremity elevates antigravity without vertical drift. Left lower extremity weakness noted; LLE moves slightly without gravity (some chronic LLE weakness and footdrop is present per patient's wife at bedside) Tone is normal. Bulk is normal.  Sensation: Intact to light touch bilaterally in all four extremities.  Gait: Deferred for patient safety   NIHSS: 1a Level of Conscious.: 0 1b LOC Questions: 0 1c LOC Commands: 1 2 Best Gaze: 0 3 Visual: 0 4 Facial Palsy: 0 5a Motor Arm - left: 0. Initially no movement, then was able to lift it off the bed without drift prior to intubation. 5b Motor Arm - Right: 0 6a Motor Leg - Left: 3 6b Motor Leg - Right: 0 7 Limb Ataxia: 0 8 Sensory: 0 9 Best Language: 1 10 Dysarthria: 0 11 Extinct. and Inatten.: 0 TOTAL: 5  Labs I have reviewed labs in epic and the results pertinent to this consultation are: CBC    Component Value Date/Time   WBC 8.6 03/01/2023 1550   RBC 4.82 03/08/2023 1550   HGB 12.2 (L) 02/19/2023 1650   HCT 36.0 (L) 02/22/2023 1650   PLT 220 02/22/2023 1550   MCV 83.6 02/24/2023 1550   MCH 26.8 02/14/2023 1550   MCHC 32.0  02/25/2023 1550   RDW 12.8 03/01/2023 1550   LYMPHSABS 2.0 03/08/2023 1550   MONOABS 0.6 03/06/2023 1550   EOSABS 0.1 03/08/2023 1550   BASOSABS 0.1 03/08/2023 1550   CMP     Component Value Date/Time   NA 136 02/26/2023 1650   K 4.4 03/04/2023 1650   CL 100 03/06/2023 1550   CO2 27 02/27/2023 1550   GLUCOSE 232 (H) 02/25/2023 1550   BUN 24 (H) 02/11/2023 1550   CREATININE 2.00 (H) 02/13/2023 1550   CALCIUM 9.2 02/13/2023 1550   PROT 7.1 02/18/2023 1550   ALBUMIN 3.5

## 2023-02-17 NOTE — Progress Notes (Signed)
Pt transported from ED18 to CT01 to [facility] by RN and RT w/o complications

## 2023-02-17 NOTE — Anesthesia Preprocedure Evaluation (Signed)
Anesthesia Evaluation  Patient identified by MRN, date of birth, ID band Patient unresponsive    Reviewed: Allergy & Precautions, Patient's Chart, lab work & pertinent test results, Unable to perform ROS - Chart review onlyPreop documentation limited or incomplete due to emergent nature of procedure.  History of Anesthesia Complications Negative for: history of anesthetic complications  Airway Mallampati: Intubated       Dental   Pulmonary former smoker    + decreased breath sounds  + intubated    Cardiovascular hypertension, Pt. on medications + Peripheral Vascular Disease  Normal cardiovascular exam   '21 TTE - EF 60 to 65%. There is mild left ventricular hypertrophy. Grade I diastolic dysfunction (impaired relaxation). Trivial mitral valve regurgitation. Mild aortic valve sclerosis is present, with no evidence of aortic valve stenosis.     Neuro/Psych CVA, Residual Symptoms    GI/Hepatic ,GERD  ,,  Endo/Other  diabetes, Insulin Dependent    Renal/GU     Prostate cancer     Musculoskeletal   Abdominal   Peds  Hematology  (+) Blood dyscrasia, anemia   Anesthesia Other Findings Covid+  Reproductive/Obstetrics                             Anesthesia Physical Anesthesia Plan  ASA: 4 and emergent  Anesthesia Plan: General   Post-op Pain Management: Minimal or no pain anticipated   Induction: Inhalational  PONV Risk Score and Plan: 2 and Treatment may vary due to age or medical condition  Airway Management Planned: Oral ETT  Additional Equipment:   Intra-op Plan:   Post-operative Plan: Post-operative intubation/ventilation  Informed Consent:      History available from chart only  Plan Discussed with: CRNA and Anesthesiologist  Anesthesia Plan Comments:        Anesthesia Quick Evaluation
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
## 2023-02-18 ENCOUNTER — Inpatient Hospital Stay (HOSPITAL_COMMUNITY): Payer: PPO

## 2023-02-18 DIAGNOSIS — U071 COVID-19: Secondary | ICD-10-CM | POA: Diagnosis not present

## 2023-02-18 DIAGNOSIS — J9601 Acute respiratory failure with hypoxia: Secondary | ICD-10-CM | POA: Diagnosis not present

## 2023-02-18 DIAGNOSIS — I6389 Other cerebral infarction: Secondary | ICD-10-CM | POA: Diagnosis not present

## 2023-02-18 DIAGNOSIS — I639 Cerebral infarction, unspecified: Secondary | ICD-10-CM | POA: Diagnosis not present

## 2023-02-18 DIAGNOSIS — J690 Pneumonitis due to inhalation of food and vomit: Secondary | ICD-10-CM

## 2023-02-18 DIAGNOSIS — J111 Influenza due to unidentified influenza virus with other respiratory manifestations: Secondary | ICD-10-CM | POA: Diagnosis not present

## 2023-02-18 LAB — BASIC METABOLIC PANEL
Anion gap: 10 (ref 5–15)
BUN: 26 mg/dL — ABNORMAL HIGH (ref 8–23)
CO2: 19 mmol/L — ABNORMAL LOW (ref 22–32)
Calcium: 7.7 mg/dL — ABNORMAL LOW (ref 8.9–10.3)
Chloride: 105 mmol/L (ref 98–111)
Creatinine, Ser: 1.94 mg/dL — ABNORMAL HIGH (ref 0.61–1.24)
GFR, Estimated: 34 mL/min — ABNORMAL LOW (ref >60)
Glucose, Bld: 251 mg/dL — ABNORMAL HIGH (ref 70–99)
Potassium: 4 mmol/L (ref 3.5–5.1)
Sodium: 134 mmol/L — ABNORMAL LOW (ref 135–145)

## 2023-02-18 LAB — MAGNESIUM: Magnesium: 1.6 mg/dL — ABNORMAL LOW (ref 1.7–2.4)

## 2023-02-18 LAB — LIPID PANEL
Cholesterol: 163 mg/dL (ref 0–200)
HDL: 38 mg/dL — ABNORMAL LOW (ref >40)
LDL Cholesterol: 101 mg/dL — ABNORMAL HIGH (ref 0–99)
Total CHOL/HDL Ratio: 4.3 RATIO
Triglycerides: 120 mg/dL (ref <150)
VLDL: 24 mg/dL (ref 0–40)

## 2023-02-18 LAB — GLUCOSE, CAPILLARY
Glucose-Capillary: 236 mg/dL — ABNORMAL HIGH (ref 70–99)
Glucose-Capillary: 234 mg/dL — ABNORMAL HIGH (ref 70–99)
Glucose-Capillary: 237 mg/dL — ABNORMAL HIGH (ref 70–99)
Glucose-Capillary: 225 mg/dL — ABNORMAL HIGH (ref 70–99)
Glucose-Capillary: 241 mg/dL — ABNORMAL HIGH (ref 70–99)
Glucose-Capillary: 216 mg/dL — ABNORMAL HIGH (ref 70–99)

## 2023-02-18 LAB — PHOSPHORUS: Phosphorus: 2.3 mg/dL — ABNORMAL LOW (ref 2.5–4.6)

## 2023-02-18 LAB — LACTIC ACID, PLASMA: Lactic Acid, Venous: 2.3 mmol/L (ref 0.5–1.9)

## 2023-02-18 LAB — TSH: TSH: 1.187 u[IU]/mL (ref 0.350–4.500)

## 2023-02-18 MED ORDER — LACTATED RINGERS IV BOLUS
500.0000 mL | Freq: Once | INTRAVENOUS | Status: AC
  Administered 2023-02-18: 500 mL via INTRAVENOUS

## 2023-02-18 MED ORDER — THIAMINE MONONITRATE 100 MG PO TABS
100.0000 mg | ORAL_TABLET | Freq: Every day | ORAL | Status: DC
  Filled 2023-02-18: qty 1

## 2023-02-18 MED ORDER — INSULIN GLARGINE-YFGN 100 UNIT/ML ~~LOC~~ SOLN
30.0000 [IU] | Freq: Every day | SUBCUTANEOUS | Status: DC
  Administered 2023-02-18: 30 [IU] via SUBCUTANEOUS
  Filled 2023-02-18 (×2): qty 0.3

## 2023-02-18 MED ORDER — TICAGRELOR 60 MG PO TABS
60.0000 mg | ORAL_TABLET | Freq: Two times a day (BID) | ORAL | Status: DC
  Administered 2023-02-18 – 2023-02-22 (×9): 60 mg
  Filled 2023-02-18 (×10): qty 1

## 2023-02-18 MED ORDER — INSULIN ASPART 100 UNIT/ML IJ SOLN
0.0000 [IU] | INTRAMUSCULAR | Status: DC
  Administered 2023-02-18 (×4): 5 [IU] via SUBCUTANEOUS
  Administered 2023-02-19 (×2): 3 [IU] via SUBCUTANEOUS

## 2023-02-18 MED ORDER — ATORVASTATIN CALCIUM 80 MG PO TABS
80.0000 mg | ORAL_TABLET | Freq: Every day | ORAL | Status: DC
  Administered 2023-02-18 – 2023-03-01 (×12): 80 mg
  Filled 2023-02-18 (×6): qty 2
  Filled 2023-02-18 (×2): qty 1
  Filled 2023-02-18 (×4): qty 2

## 2023-02-18 MED ORDER — MAGNESIUM SULFATE 2 GM/50ML IV SOLN
2.0000 g | Freq: Once | INTRAVENOUS | Status: AC
  Administered 2023-02-18: 2 g via INTRAVENOUS
  Filled 2023-02-18: qty 50

## 2023-02-18 MED ORDER — VITAL 1.5 CAL PO LIQD
1000.0000 mL | ORAL | Status: DC
  Administered 2023-02-18: 1000 mL

## 2023-02-18 MED ORDER — VITAL HIGH PROTEIN PO LIQD: 1000.0000 mL | ORAL | Status: DC

## 2023-02-18 MED ORDER — ASPIRIN 81 MG PO CHEW
81.0000 mg | CHEWABLE_TABLET | Freq: Every day | ORAL | Status: DC
  Administered 2023-02-18 – 2023-02-19 (×2): 81 mg
  Filled 2023-02-18: qty 1

## 2023-02-18 MED ORDER — ASPIRIN 81 MG PO CHEW
81.0000 mg | CHEWABLE_TABLET | Freq: Every day | ORAL | Status: DC
  Filled 2023-02-18: qty 1

## 2023-02-18 MED ORDER — ATORVASTATIN CALCIUM 40 MG PO TABS: 40.0000 mg | ORAL_TABLET | Freq: Every day | ORAL | Status: DC

## 2023-02-18 MED ORDER — TICAGRELOR 90 MG PO TABS
90.0000 mg | ORAL_TABLET | Freq: Two times a day (BID) | ORAL | Status: DC
  Filled 2023-02-18: qty 1

## 2023-02-18 MED ORDER — THIAMINE HCL 100 MG/ML IJ SOLN
100.0000 mg | Freq: Every day | INTRAMUSCULAR | Status: DC
  Administered 2023-02-18: 100 mg via INTRAVENOUS
  Filled 2023-02-18: qty 2

## 2023-02-18 MED ORDER — PROSOURCE TF20 ENFIT COMPATIBL EN LIQD
60.0000 mL | Freq: Every day | ENTERAL | Status: DC
  Administered 2023-02-18 – 2023-02-19 (×2): 60 mL
  Filled 2023-02-18 (×2): qty 60

## 2023-02-18 NOTE — Progress Notes (Signed)
  Echocardiogram 2D Echocardiogram has been performed.  Delcie Roch 02/18/2023, 4:02 PM

## 2023-02-18 NOTE — Progress Notes (Signed)
Pt transported on the ventilator and back from 2M09 to CT1 without complication. RT, RN and transport accompanied patient.

## 2023-02-18 NOTE — Progress Notes (Addendum)
NAME:  Xavier [name], MRN:  [id number], DOB:  1941-02-04, LOS: 1 ADMISSION DATE:  02/17/2023  History of Present Illness:  82 year old male with hypertension, diabetes, CKD 3B, who presented after acute onset left-sided weakness, facial droop, and aphasia.  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  Tenecteplase administered.  He was intubated for airway protection.  He underwent revascularization and stenting of occluded right MCA and revascularization of  occluded right PCA without stenting.  Notably, he was diagnosed with COVID and flu on same day as strokelike symptoms occurred.  Significant Hospital Events:  8/9 admitted to MICU at [facility] after acute cerebral infarct s/p TNK and thrombectomy, intubated for airway protection, antibiotics started for aspiration, antivirals started for COVID and flu  Interim History / Subjective:  Afebrile Labile blood pressures PRVC 20/650/5/50% Sedated with propofol  Objective   Blood pressure (!) 137/55, pulse (!) 55, temperature 99.7 F (37.6 C), temperature source Esophageal, resp. rate 20, height 6\' 2"  (1.88 m), weight 84.4 kg, SpO2 97%.    Vent Mode: PRVC FiO2 (%):  [50 %-100 %] 50 % Set Rate:  [20 bmp] 20 bmp Vt Set:  [650 mL] 650 mL PEEP:  [5 cmH20] 5 cmH20 Plateau Pressure:  [17 cmH20-20 cmH20] 19 cmH20   Intake/Output Summary (Last 24 hours) at 02/18/2023 0739 Last data filed at 02/18/2023 0600 Gross per 24 hour  Intake 3858.71 ml  Output 1075 ml  Net 2783.71 ml   Filed Weights   02/17/23 1838 02/17/23 2018 02/18/23 0338  Weight: 83.3 kg 84.4 kg 84.4 kg    Examination: Intubated and sedated Heart rate is bradycardic, rhythm is regular Ventilated at a rate of 20 Skin is warm and dry Unresponsive to verbal  Labs: Sodium 133 Potassium 4.4 Bicarb 18 Glucose 244 BUN/creatinine 28/1.98 Mild hypoalbuminemia LDL 101 WBC 3.6 Hemoglobin  11.4 Platelets 201 TSH 1.187 UA bland with small proteinuria Urine tox screen negative Blood and respiratory cultures pending Respiratory pathogen panel and SARS-CoV-2 screen both negative for flu or COVID  Resolved Hospital Problem list   Resolved Problems:   * No resolved hospital problems. *   Assessment & Plan:  Principal Problem:   Acute ischemic stroke [facility]) Active Problems:   Acute respiratory failure with hypoxia (HCC)   Pneumonia due to COVID-19 virus  Acute cerebral infarct Right MCA and PCA occlusions Status post tenecteplase and thrombectomy with stent placement in right MCA - Management per neurology - SBP 120-140 mmHg - Continue clevidipine infusion - Hold DAPT and anticoagulation pending imaging to rule out bleeding - Discussed with neurology  Acute hypoxic respiratory failure Viral pneumonia due to COVID-19 and flu versus aspiration pneumonitis - PRVC - Maintain sedation to facilitate comfort on ventilator - Ceftriaxone and azithromycin through 02/22/2023 - Consider ampicillin-sulbactam for worsening respiratory status - Oseltamivir - Remdesivir  Bradycardia Hypertension - Monitor for signs of deteriorating neurologic status - Clevidipine infusion per above  Hyperlipidemia Peripheral artery disease - Aspirin - Restart home atorvastatin 80 mg  CKD 3B - Creatinine consistent with chronic kidney disease - Trend BMP  Diabetes, insulin-dependent - Hyperglycemia - Increase to moderate SSI  Nutrition Status: - Start tube feeds  Best practice (daily eval):  Diet/type: tubefeeds DVT prophylaxis: SCD GI prophylaxis: PPI Lines: Arterial Line, still needed. Left femoral sheath. Foley:  Yes, and it is still needed Code Status:  full code Last date of  multidisciplinary goals of care discussion [per primary]  Marrianne Mood MD 02/18/2023, 7:39 AM  Pager: [phone]

## 2023-02-18 NOTE — Plan of Care (Signed)
Pt intubated and sedated 

## 2023-02-18 NOTE — Progress Notes (Addendum)
MRI calling to coordinate 24 hour MRI. Per earlier discussion with Dr Pearlean Brownie  Pt was to get CT because of being very unstable. Patient BP is very labile and during brief trip to CT between 1100-1140 earlier today Pts systolic was 70s to 170s. During thsi brief time with minimal changes to levophed.  Pt is very unstable and BP is very labile. Will disscuss with MD.  Discussed Pt condition briefly with Gemma Payor NP. Understand that imaging is due 24hrs s/p intervention and CT will be alternative and will mitigate risk. NP is aware that Pts BP is very labile, unstable, and sensitive to levophed. Small changes/interruptions to IV levophed cause big changes to BP. RN concerned concerned about negative impact to BP switching to MRI IV pumps and the inability to titrate using the MRI IV pumps

## 2023-02-18 NOTE — Progress Notes (Signed)
eLink Physician-Brief Progress Note Patient Name: Xavier [name] DOB: 28-Sep-1940 MRN: [id number]   Date of Service  02/18/2023  HPI/Events of Note  Notified of lactic acid 2.9 BP more stable after fluid bolus on stable dose of norepinephrine  eICU Interventions  Ordered another 500 cc LR bolus     Intervention Category Intermediate Interventions: Hypotension - evaluation and management  Darl Pikes 02/18/2023, 3:42 AM

## 2023-02-18 NOTE — Progress Notes (Addendum)
STROKE TEAM PROGRESS NOTE   BRIEF HPI Xavier [name] is a 82 y.o. male with history of type 2 diabetes mellitus, prostate cancer, and essential hypertension who presented to the ED via POV after a syncopal episode in the vehicle today with associated foaming at the mouth and facial asymmetry   S/p TNK and IR mechanical thrombectomy of right M1 and right P2 with stent assisted angioplasty of right M1.  Patient is COVID-positive  SIGNIFICANT HOSPITAL EVENTS 8/9 presented as code stroke received TNK at 1650.  Mechanical thrombectomy of right M1 with stent and right P2.  COVID-positive  INTERIM HISTORY/SUBJECTIVE Patient has remained intubated.  He is on propofol at 50 mcg and Levophed IV.  Repeat CT head this morning with diminished but persistent right occipital subarachnoid hemorrhage with right PCA territory cytotoxic edema.  Will start Brilinta at 60 mg twice daily as discussed with Dr. Corliss Skains and aspirin 81 mg daily Will need 24-hour brain imaging at around 1630 today.  MRI brain has been ordered Neurological exam is limited due to sedation No family at the bedside.  Discussed with critical care team  OBJECTIVE  CBC    Component Value Date/Time   WBC 3.6 (L) 02/18/2023 0344   RBC 4.36 02/18/2023 0344   HGB 11.4 (L) 02/18/2023 0344   HCT 35.8 (L) 02/18/2023 0344   PLT 201 02/18/2023 0344   MCV 82.1 02/18/2023 0344   MCH 26.1 02/18/2023 0344   MCHC 31.8 02/18/2023 0344   RDW 13.1 02/18/2023 0344   LYMPHSABS 2.0 02/17/2023 1550   MONOABS 0.6 02/17/2023 1550   EOSABS 0.1 02/17/2023 1550   BASOSABS 0.1 02/17/2023 1550    BMET    Component Value Date/Time   NA 133 (L) 02/18/2023 0344   K 4.4 02/18/2023 0344   CL 105 02/18/2023 0344   CO2 18 (L) 02/18/2023 0344   GLUCOSE 244 (H) 02/18/2023 0344   BUN 28 (H) 02/18/2023 0344   CREATININE 1.98 (H) 02/18/2023 0344   CALCIUM 7.8 (L) 02/18/2023 0344   GFRNONAA 33 (L) 02/18/2023 0344    IMAGING past 24 hours CT HEAD WO  CONTRAST ( )  Result Date: 02/18/2023 CLINICAL DATA:  81 year old male code stroke presentation yesterday with right MCA and PCA occlusion status post revascularization of the MCA, requiring stent assisted angioplasty after reocclusion. Revascularization of the right PCA. Contrast staining versus subarachnoid or petechial hemorrhage post NIR. EXAM: CT HEAD WITHOUT CONTRAST TECHNIQUE: Contiguous axial images were obtained from the base of the skull through the vertex without intravenous contrast. RADIATION DOSE REDUCTION: This exam was performed according to the departmental dose-optimization program which includes automated exposure control, adjustment of the mA and/or kV according to patient size and/or use of iterative reconstruction technique. COMPARISON:  Head CT yesterday and earlier. FINDINGS: Brain: Regressed gyriform and sulcal hyperdensity in the right occipital lobe since last night. No definite intraventricular blood. No ventriculomegaly or midline shift. Basilar cisterns remain normal. Underlying right occipital cytotoxic edema or encephalomalacia. No evolving right MCA infarct is evident and elsewhere gray-white differentiation is stable. Vascular: Right MCA M1 region vascular stent redemonstrated. Less retained intravascular contrast at this time. Skull: Intact.  No acute osseous abnormality identified. Sinuses/Orbits: Stable paranasal sinuses, tympanic cavities and mastoids. Patient remains intubated on the scout view. Other: No acute orbit or scalp soft tissue finding. IMPRESSION: 1. Persistent but regressed right occipital hyperdensity since last night. Favor subarachnoid hemorrhage (Heidelberg classification 3c: Subarachnoid hemorrhage) over contrast staining. No intracranial mass effect. 2.  Underlying right PCA territory cytotoxic edema or encephalomalacia. No evolving right MCA territory infarct is evident. 3. No new intracranial abnormality. Electronically Signed   By: Odessa Fleming M.D.   On:  02/18/2023 11:46   DG CHEST PORT 1 VIEW  Result Date: 02/18/2023 CLINICAL DATA:  [id number] Acute respiratory failure with hypoxia (HCC) [id number] EXAM: PORTABLE CHEST 1 VIEW COMPARISON:  Chest radiograph from one day prior. FINDINGS: Endotracheal tube tip is 4.2 cm above the carina. Enteric tube terminates in the gastric fundus. Esophageal temperature probe terminates in the proximal stomach. Stable cardiomediastinal silhouette with normal heart size. No pneumothorax. No pleural effusion. New borderline mild pulmonary edema. Increased hazy bibasilar lung opacities. IMPRESSION: 1. Well-positioned support structures. 2. New borderline mild pulmonary edema. 3. Increased hazy bibasilar lung opacities, favor atelectasis. Electronically Signed   By: Delbert Phenix M.D.   On: 02/18/2023 08:08   CT HEAD WO CONTRAST ( )  Result Date: 02/17/2023 CLINICAL DATA:  Stroke follow-up EXAM: CT HEAD WITHOUT CONTRAST TECHNIQUE: Contiguous axial images were obtained from the base of the skull through the vertex without intravenous contrast. RADIATION DOSE REDUCTION: This exam was performed according to the departmental dose-optimization program which includes automated exposure control, adjustment of the mA and/or kV according to patient size and/or use of iterative reconstruction technique. COMPARISON:  Head CT 02/17/2023 FINDINGS: Brain: There is subarachnoid hyperdensity over the posterior right hemisphere, possibly contrast staining. Loss of gray-white differentiation in the posterior right MCA territory is unchanged. No midline shift or other mass effect. Vascular: There is now a stent in the right MCA. Skull: The visualized skull base, calvarium and extracranial soft tissues are normal. Sinuses/Orbits: Bilateral maxillary retention cysts. The orbits are normal. IMPRESSION: 1. 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 2. Hyperdensity over the posterior right hemisphere, possibly contrast staining. Attention  on follow-up. Electronically Signed   By: Deatra Robinson M.D.   On: 02/17/2023 22:11   CT ANGIO HEAD NECK W WO CM (CODE STROKE)  Result Date: 02/17/2023 CLINICAL DATA:  Neuro deficit, acute, stroke suspected EXAM: CT ANGIOGRAPHY HEAD AND NECK WITH AND WITHOUT CONTRAST TECHNIQUE: Multidetector CT imaging of the head and neck was performed using the standard protocol during bolus administration of intravenous contrast. Multiplanar CT image reconstructions and MIPs were obtained to evaluate the vascular anatomy. Carotid stenosis measurements (when applicable) are obtained utilizing NASCET criteria, using the distal internal carotid diameter as the denominator. RADIATION DOSE REDUCTION: This exam was performed according to the departmental dose-optimization program which includes automated exposure control, adjustment of the mA and/or kV according to patient size and/or use of iterative reconstruction technique. CONTRAST:  75mL OMNIPAQUE IOHEXOL 350 MG/ML SOLN COMPARISON:  Same day CT head FINDINGS: CT HEAD FINDINGS See same day CT head for intracranial findings CTA NECK FINDINGS Aortic arch: Standard branching. Imaged portion shows no evidence of aneurysm or dissection. No significant stenosis of the major arch vessel origins. Right carotid system: No evidence of dissection, stenosis (50% or greater), or occlusion. Mild narrowing of the origin of the right ICA secondary to calcified atherosclerotic plaque Left carotid system: No evidence of dissection, stenosis (50% or greater), or occlusion. Mild narrowing of the origin of the left ICA secondary to calcified atherosclerotic plaque. Moderate narrowing in the distal petrous segment of the left ICA (series 12, image 126). Vertebral arteries: Left dominant. No evidence of dissection, stenosis (50% or greater), or occlusion. The right vertebral artery is diffusely small in caliber. This is favored to be congenital  given small size of the transverse foramina the right.  Skeleton: Severe spinal canal stenosis at C3-C4 secondary to a circumferential disc bulge. No fracture. No worrisome osseous lesions. Other neck: There are layering secretions in the oropharynx. Upper chest: Moderate centrilobular emphysema. Partially visualized endotracheal tube in place. Review of the MIP images confirms the above findings CTA HEAD FINDINGS Anterior circulation: Severe narrowing in the supraclinoid ICA on the left (series 12, image 113). Moderate to severe narrowing of the supraclinoid ICA on the right (series 12, image 112). The right M1 is occluded (series 11, image 122). Posterior circulation: The right PCA is occluded at the P1 P2 junction (series 10, image 84). Venous sinuses: As permitted by contrast timing, patent. Anatomic variants: Fetal PCAs bilaterally. Review of the MIP images confirms the above findings IMPRESSION: 1. Occlusion of the right M1 segment and right PCA at the P1 P2 junction. 2. Severe narrowing in the supraclinoid ICA on the left and moderate to severe narrowing of the supraclinoid ICA on the right. 3. Moderate narrowing in the distal petrous segment of the left ICA. 4. Severe spinal canal stenosis at C3-C4 secondary to a circumferential disc bulge. 5. Emphysema. Findings were discussed with Dr. Viviann Spare on 02/17/23 at 4:32 PM. Note that a stroke alert was not activated for this exam, which may have delayed interpretation of this exam. Emphysema (ICD10-J43.9). Electronically Signed   By: Lorenza Cambridge M.D.   On: 02/17/2023 16:44   DG Chest Portable 1 View  Result Date: 02/17/2023 CLINICAL DATA:  Intubation EXAM: PORTABLE CHEST 1 VIEW COMPARISON:  X-ray 09/28/2022 FINDINGS: ET tube 5 cm above the carina. No pneumothorax or effusion. Normal cardiopericardial silhouette without edema. Developing mild opacities along the lung bases, left-greater-than-right. Subtle infiltrate is possible. Recommend follow-up. Overlapping cardiac leads. IMPRESSION: ET tube seen with tip 5 cm  above the carina. Lung base opacities.  Recommend follow-up Electronically Signed   By: Karen Kays M.D.   On: 02/17/2023 16:39   CT HEAD CODE STROKE WO CONTRAST  Result Date: 02/17/2023 CLINICAL DATA:  Code stroke.  No localizing signs provided EXAM: CT HEAD WITHOUT CONTRAST TECHNIQUE: Contiguous axial images were obtained from the base of the skull through the vertex without intravenous contrast. RADIATION DOSE REDUCTION: This exam was performed according to the departmental dose-optimization program which includes automated exposure control, adjustment of the mA and/or kV according to patient size and/or use of iterative reconstruction technique. COMPARISON:  MR head 12/10/17 FINDINGS: Brain: Acute right MCA and PCA territory infarcts involving portions of the right insula and posterior temporal lobe. No hemorrhage. No extra-axial fluid collection. No hydrocephalus. Vascular: See subsequently obtained CT angiogram. Skull: Normal. Negative for fracture or focal lesion. Sinuses/Orbits: No middle ear or mastoid effusion. Polypoid mucosal thickening of the floor of the left maxillary sinus. Bilateral lens replacement in the left scleral band. Orbits are otherwise unremarkable. Other: None. ASPECTS [facility] Stroke Program Early CT Score): 8 IMPRESSION: Acute right MCA and PCA territory infarct involving portions of the right insula and temporal lobe. No hemorrhage. Aspects is 8. Findings were discussed with Dr. Viviann Spare on 02/17/23 at 4:32 PM. Electronically Signed   By: Lorenza Cambridge M.D.   On: 02/17/2023 16:33    Vitals:   02/18/23 1131 02/18/23 1132 02/18/23 1145 02/18/23 1200  BP:      Pulse: (!) 59 (!) 56 (!) 57 (!) 56  Resp: 20 16 20 20   Temp:  99.1 F (37.3 C) 98.8 F (37.1 C) 98.6 F (  37 C)  TempSrc:      SpO2: 98% 98% 96% 97%  Weight:      Height:         PHYSICAL EXAM General: Sedated and intubated elderly African-American male in no apparent distress Psych:  Mood and affect appropriate for  situation CV: Regular rate and rhythm on monitor Respiratory:  Regular, unlabored respirations on room air GI: Abdomen soft and nontender   NEURO:  Mental Status: Exam is limited to sedation.  Eyes are closed does not open eyes to voice or tactile stimulation with sedation paused does not follow commands Speech/Language: Not able to assess due to ET tube  Cranial Nerves:  II: PERRL.  Unable to assess visual fields due to sedation III, IV, VI: EOMI. eyes are midline V: Sensation is intact to light touch and symmetrical to face.  VII: unAble to assess due to VIII: hearing intact to voice. IX, X: Unable to assess XI: Unable to assess XII: tongue is midline without fasciculations. Motor: Right arm and leg with minimal withdrawal no movement of left hemibody Tone: is normal and bulk is normal Sensation-once to noxious stimuli as above Coordination: Unable to assess Gait- deferred   ASSESSMENT/PLAN  Acute Ischemic Infarct:  right MCA/PCA s/p TNK and mechanical thrombectomy with stent placement in right MCA Etiology: Embolic versus hypercoagulable state Code Stroke  CT head cute right MCA and PCA territory infarct ASPECTS 8   CTA head & neck  Occlusion of the right M1 segment and right PCA at the P1 P2 junction. 2. Severe narrowing in the supraclinoid ICA on the left and moderate to severe narrowing of the supraclinoid ICA on the right. 3. Moderate narrowing in the distal petrous segment of the left ICA. Post IR CT  patchy focal areas of hyperattenuation in the right parietal occipital region contrast staining versus subarachnoid hemorrhage/petechial hemorrhages.  Repeat CT head Persistent but regressed right occipital hyperdensity since last  night. Favor subarachnoid hemorrhage. Underlying right PCA territory cytotoxic edema or encephalomalacia. MRI ordered 24-hour repeat head CT scheduled for 1630 today 2D Echo ordered LDL 101 HgbA1c 9.0 VTE prophylaxis -SCDs aspirin 81 mg  daily prior to admission, now on aspirin 81 mg daily and Brilinta 60 mg twice daily  Therapy recommendations: Pending Disposition: Pending   Hypertension Home meds: Norvasc 10 mg, Coreg 25 mg twice daily, losartan 25 mg daily, Dyazide  Stable Blood Pressure Goal: SBP 120-160 for first 24 hours then less than 180   Hyperlipidemia Home meds: Atorvastatin 80 mg, resumed in hospital LDL 101, goal < 70 Continue statin at discharge  Diabetes type II UnControlled Home meds: Toujeo, Farxiga HgbA1c 9.0, goal < 7.0 CBGs SSI Recommend close follow-up with PCP for better DM control  Former tobacco Abuse Noted  Acute hypoxic respiratory failure with COVID-19 and flu ?  Aspiration pneumonia Managed per CCM  Propofol for sedation On ceftriaxone and azithromycin On remdesivir  Dysphagia Patient has post-stroke dysphagia, SLP consulted    Diet   Diet NPO time specified   Consider starting tube feeds   Other Active Problems CKD  Hospital day # 1  Gevena Mart DNP, ACNPC-AG  [facility]  STROKE MD NOTE :  I have personally obtained history,examined this patient, reviewed notes, independently viewed imaging studies, participated in medical decision making and plan of care.ROS completed by me personally and pertinent positives fully documented  I have made any additions or clarifications directly to the above note. Agree with note above.  He presented  with sudden onset of left hemiplegia due to right M1 and right P1 occlusion and underwent successful mechanical thrombectomy of both vessels but there is small periprocedural subarachnoid hemorrhage which appears improving on follow-up imaging today.  He required rescue right MCA stent.  Recommend close neurological observation and strict blood pressure control as per post thrombectomy protocol.  Keep systolic blood pressure 120-160.  Reduced dose Brilinta 60 mg twice daily due to subarachnoid hemorrhage.  Management of COVID and  ventilator as per critical care team discussed with Dr. Corliss Skains neurointerventional radiology and critical care team.  No family available at the bedside.This patient is critically ill and at significant risk of neurological worsening, death and care requires constant monitoring of vital signs, hemodynamics,respiratory and cardiac monitoring, extensive review of multiple databases, frequent neurological assessment, discussion with family, other specialists and medical decision making of high complexity.I have made any additions or clarifications directly to the above note.This critical care time does not reflect procedure time, or teaching time or supervisory time of PA/NP/Med Resident etc but could involve care discussion time.  I spent 30 minutes of neurocritical care time  in the care of  this patient.      Delia Heady, MD Medical Director [facility] Stroke Center Pager: [phone] 02/18/2023 1:16 PM   To contact Stroke Continuity provider, please refer to [email or web address]. After hours, contact General Neurology

## 2023-02-18 NOTE — Progress Notes (Signed)
Pt back from CT

## 2023-02-18 NOTE — Progress Notes (Signed)
PT Cancellation Note  Patient Details Name: Xavier [name] MRN: [id number] DOB: 1940/08/24   Cancelled Treatment:    Reason Eval/Treat Not Completed: Medical issues which prohibited therapy  Pt intubated and sedated.  Will f/u at later date. Anise Salvo, PT Acute Rehab [facility] Rehab [phone]  Rayetta Humphrey 02/18/2023, 9:09 AM

## 2023-02-18 NOTE — Progress Notes (Signed)
Pt transported from 2M09 to MRI and back without complications. Pt suctioned prior to transport.

## 2023-02-18 NOTE — Progress Notes (Signed)
Pts nephew called with concerns about patient environment. Pts home being evaluated for mold/mildew abatement. Nephew Dannielle Burn [phone] concerned some of pts health issues might be caused by mold.

## 2023-02-18 NOTE — Progress Notes (Signed)
Initial Nutrition Assessment  DOCUMENTATION CODES:   Not applicable  INTERVENTION:  Initiate tube feeding via OGT: Start Vital 1.5 at 20 ml/h and advance by 10ml q8h to a goal rate of 43ml/hr (1320 ml per day) Prosource TF20 60 ml once daily Provides 2060 kcal, 109 gm protein, 1008 ml free water daily  Monitor magnesium, potassium, and phosphorus BID for at least 3 days, MD to replete as needed, as pt is at elevated risk for refeeding syndrome given decrease in oral intake >/=7 days.   Recommend thiamine 100mg  x3 days  NUTRITION DIAGNOSIS:   Inadequate oral intake related to acute illness as evidenced by NPO status.  GOAL:   Patient will meet greater than or equal to 90% of their needs  MONITOR:   Labs, Weight trends, Vent status, TF tolerance  REASON FOR ASSESSMENT:   Consult, Ventilator Enteral/tube feeding initiation and management  ASSESSMENT:   Pt admitted with acute L-sided weakness d/t acute ischemic stroke following outpatient MD appointment where he was diagnosed with COVID and flu however pt tested negative on admission. PMH significant for T2DM, prostate cancer, essential HTN, CKD IIIb.  8/9 - s/p TNK, thrombectomy, intubated for airway protection 8/10 - tube feeding initiated (OGT side port gastric)  Per H&P, pt's wife reported with with significant drowsiness and hypersomnia over the last several months, with worsening hypersomnolence over the last week.   Spoke with pt's wife via phone call. She reports at his baseline he eats about 2 meals per day. Over at least the last week he has been consuming about 10-15% less than his baseline. She denies noticeable difficulty chewing/swallowing foods. He occasionally, but not consistently, will drink an Ensure supplement for additional nutrition. He take a MVI and Vitamin D at home daily.   Given decreased intake for at least the last 7 days, pt is likely at elevated refeeding risk. Discussed adding thiamine with MD  to reduce risk.   Patient is currently intubated on ventilator support MV: 12.4 L/min Temp (24hrs), Avg:97.2 F (36.2 C), Min:93 F (33.9 C), Max:99.7 F (37.6 C) MAP (a-line) 87  Propofol: 25.5 ml/hr ( per day)  Unfortunately there is limited weight history on file to review over the last year. Since March, pt's weight appears to remain fairly stable between 83-84 kg.  Current weight: 84.4 kg  Per pt's wife, he had had a gradual weight loss over the last 6 months of about 5-6 lbs.   Medications: colace, SSI 0-15 units q4h, protonix, miralax Drips: Levo @4mcg /min propofol  Labs: sodium 133, BUN 28, Cr 1.98, Mg 1.6, GFR 33, HgbA1c 9.0%, CBG's 182-236 x24 hours   NUTRITION - FOCUSED PHYSICAL EXAM: RD working remotely. Deferred to follow up.   Diet Order:   Diet Order             Diet NPO time specified  Diet effective now                   EDUCATION NEEDS:   Not appropriate for education at this time  Skin:  Skin Assessment: Reviewed RN Assessment  Last BM:  PTA  Height:   Ht Readings from Last 1 Encounters:  02/17/23 6\' 2"  (1.88 m)    Weight:   Wt Readings from Last 1 Encounters:  02/18/23 84.4 kg   BMI:  Body mass index is 23.89 kg/m.  Estimated Nutritional Needs:   Kcal:  2000-2200  Protein:  100-115g  Fluid:  >/=2L  Drusilla Kanner, RDN, LDN  Clinical Nutrition

## 2023-02-18 NOTE — Progress Notes (Signed)
SLP Cancellation Note  Patient Details Name: KAVON SALAMA MRN: [id number] DOB: 06-02-1941   Cancelled treatment:       Reason Eval/Treat Not Completed: Patient not medically ready. Patient remains intubated. SLP will follow for readiness.   Angela Nevin, MA, CCC-SLP Speech Therapy

## 2023-02-18 NOTE — Progress Notes (Signed)
Pt off unit to CT

## 2023-02-18 NOTE — Progress Notes (Signed)
    Code stroke 8/9 Status post complete revascularization of occluded right middle cerebral artery with 1 pass with 055 zoom complex aspiration achieving aTICI 3 revascularization.Marland Kitchen   Reocclusion due to underlying severe atherosclerosis requiring revascularization with stent assisted angioplasty using a 2 x 15 mm Onyx balloon mandible stent with return toTICI 3 revascularization   Revascularization of the right posterior cerebral P1 P2 junction with mechanical thrombolysis with an O2 8 microguidewire.   Post CT of the brain demonstrating patchy focal areas of hyperattenuation in the right parietal occipital region contrast staining versus subarachnoid hemorrhage/petechial hemorrhages.  ++Covid Spoke to RN via phone Dr Corliss Skains also on the call--- Sedated; vent Rt groin NT no bleeding; soft w/o hematoma Rt foot doppler pulses; faint palpable pulses  CTIMPRESSION: 1. 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 2. Hyperdensity over the posterior right hemisphere, possibly contrast staining. Attention on follow-up  Ordered ASA 81 mg daily Brilinta 90 mg BID

## 2023-02-19 ENCOUNTER — Inpatient Hospital Stay (HOSPITAL_COMMUNITY): Payer: PPO

## 2023-02-19 DIAGNOSIS — U071 COVID-19: Secondary | ICD-10-CM | POA: Diagnosis not present

## 2023-02-19 DIAGNOSIS — J9601 Acute respiratory failure with hypoxia: Secondary | ICD-10-CM | POA: Diagnosis not present

## 2023-02-19 DIAGNOSIS — I639 Cerebral infarction, unspecified: Secondary | ICD-10-CM | POA: Diagnosis not present

## 2023-02-19 DIAGNOSIS — J111 Influenza due to unidentified influenza virus with other respiratory manifestations: Secondary | ICD-10-CM | POA: Diagnosis not present

## 2023-02-19 LAB — BASIC METABOLIC PANEL WITH GFR
Anion gap: 9 (ref 5–15)
BUN: 47 mg/dL — ABNORMAL HIGH (ref 8–23)
CO2: 20 mmol/L — ABNORMAL LOW (ref 22–32)
Calcium: 7.8 mg/dL — ABNORMAL LOW (ref 8.9–10.3)
Chloride: 106 mmol/L (ref 98–111)
Creatinine, Ser: 3.03 mg/dL — ABNORMAL HIGH (ref 0.61–1.24)
GFR, Estimated: 20 mL/min — ABNORMAL LOW (ref >60)
Glucose, Bld: 195 mg/dL — ABNORMAL HIGH (ref 70–99)
Potassium: 4.4 mmol/L (ref 3.5–5.1)
Sodium: 135 mmol/L (ref 135–145)

## 2023-02-19 LAB — POCT I-STAT 7, (LYTES, BLD GAS, ICA,H+H)
Acid-base deficit: 5 mmol/L — ABNORMAL HIGH (ref 0.0–2.0)
Bicarbonate: 18.2 mmol/L — ABNORMAL LOW (ref 20.0–28.0)
Calcium, Ion: 1.09 mmol/L — ABNORMAL LOW (ref 1.15–1.40)
HCT: 27 % — ABNORMAL LOW (ref 39.0–52.0)
Hemoglobin: 9.2 g/dL — ABNORMAL LOW (ref 13.0–17.0)
O2 Saturation: 98 %
Patient temperature: 101.2
Potassium: 3.9 mmol/L (ref 3.5–5.1)
Sodium: 135 mmol/L (ref 135–145)
TCO2: 19 mmol/L — ABNORMAL LOW (ref 22–32)
pCO2 arterial: 28.7 mmHg — ABNORMAL LOW (ref 32–48)
pH, Arterial: 7.416 (ref 7.35–7.45)
pO2, Arterial: 108 mmHg (ref 83–108)

## 2023-02-19 LAB — GLUCOSE, CAPILLARY
Glucose-Capillary: 184 mg/dL — ABNORMAL HIGH (ref 70–99)
Glucose-Capillary: 195 mg/dL — ABNORMAL HIGH (ref 70–99)
Glucose-Capillary: 264 mg/dL — ABNORMAL HIGH (ref 70–99)
Glucose-Capillary: 183 mg/dL — ABNORMAL HIGH (ref 70–99)
Glucose-Capillary: 123 mg/dL — ABNORMAL HIGH (ref 70–99)
Glucose-Capillary: 73 mg/dL (ref 70–99)

## 2023-02-19 LAB — PHOSPHORUS: Phosphorus: 3.4 mg/dL (ref 2.5–4.6)

## 2023-02-19 LAB — CBC
HCT: 30 % — ABNORMAL LOW (ref 39.0–52.0)
Hemoglobin: 9.8 g/dL — ABNORMAL LOW (ref 13.0–17.0)
MCH: 26.9 pg (ref 26.0–34.0)
MCHC: 32.7 g/dL (ref 30.0–36.0)
MCV: 82.4 fL (ref 80.0–100.0)
Platelets: 144 10*3/uL — ABNORMAL LOW (ref 150–400)
RBC: 3.64 MIL/uL — ABNORMAL LOW (ref 4.22–5.81)
RDW: 13.6 % (ref 11.5–15.5)
WBC: 7.8 10*3/uL (ref 4.0–10.5)
nRBC: 0 % (ref 0.0–0.2)

## 2023-02-19 LAB — POCT ACTIVATED CLOTTING TIME: Activated Clotting Time: 0 seconds

## 2023-02-19 LAB — MAGNESIUM: Magnesium: 2.6 mg/dL — ABNORMAL HIGH (ref 1.7–2.4)

## 2023-02-19 MED ORDER — FENTANYL CITRATE PF 50 MCG/ML IJ SOSY: 100.0000 ug | PREFILLED_SYRINGE | Freq: Once | INTRAMUSCULAR | Status: AC

## 2023-02-19 MED ORDER — FENTANYL BOLUS VIA INFUSION
25.0000 ug | INTRAVENOUS | Status: DC | PRN
  Administered 2023-02-20: 50 ug via INTRAVENOUS
  Administered 2023-02-20 (×3): 100 ug via INTRAVENOUS
  Administered 2023-02-20: 50 ug via INTRAVENOUS
  Administered 2023-02-20 (×3): 100 ug via INTRAVENOUS

## 2023-02-19 MED ORDER — DEXMEDETOMIDINE HCL IN NACL 400 MCG/100ML IV SOLN
INTRAVENOUS | Status: AC
  Administered 2023-02-19: 0.2 ug/kg/h via INTRAVENOUS
  Filled 2023-02-19: qty 100

## 2023-02-19 MED ORDER — DOCUSATE SODIUM 50 MG/5ML PO LIQD
100.0000 mg | Freq: Two times a day (BID) | ORAL | Status: DC
  Administered 2023-02-20 – 2023-03-01 (×6): 100 mg
  Filled 2023-02-19 (×12): qty 10

## 2023-02-19 MED ORDER — OSELTAMIVIR PHOSPHATE 30 MG PO CAPS
30.0000 mg | ORAL_CAPSULE | Freq: Every day | ORAL | Status: DC
  Administered 2023-02-19: 30 mg
  Filled 2023-02-19 (×2): qty 1

## 2023-02-19 MED ORDER — ETOMIDATE 2 MG/ML IV SOLN: 20.0000 mg | Freq: Once | INTRAVENOUS | Status: AC

## 2023-02-19 MED ORDER — FENTANYL 2500MCG IN NS 250ML (10MCG/ML) PREMIX INFUSION
25.0000 ug/h | INTRAVENOUS | Status: DC
  Administered 2023-02-20: 150 ug/h via INTRAVENOUS
  Administered 2023-02-20: 50 ug/h via INTRAVENOUS
  Administered 2023-02-21: 150 ug/h via INTRAVENOUS
  Filled 2023-02-19 (×3): qty 250

## 2023-02-19 MED ORDER — INSULIN ASPART 100 UNIT/ML IJ SOLN
0.0000 [IU] | INTRAMUSCULAR | Status: DC
  Administered 2023-02-19: 3 [IU] via SUBCUTANEOUS
  Administered 2023-02-19: 4 [IU] via SUBCUTANEOUS
  Administered 2023-02-19: 11 [IU] via SUBCUTANEOUS
  Administered 2023-02-20: 20 [IU] via SUBCUTANEOUS
  Administered 2023-02-20: 15 [IU] via SUBCUTANEOUS
  Administered 2023-02-20 (×2): 4 [IU] via SUBCUTANEOUS
  Administered 2023-02-20: 15 [IU] via SUBCUTANEOUS

## 2023-02-19 MED ORDER — CLEVIDIPINE BUTYRATE 0.5 MG/ML IV EMUL
0.0000 mg/h | INTRAVENOUS | Status: DC
  Administered 2023-02-19: 30 mg/h via INTRAVENOUS
  Administered 2023-02-19: 26 mg/h via INTRAVENOUS
  Administered 2023-02-19: 4 mg/h via INTRAVENOUS
  Administered 2023-02-19: 16 mg/h via INTRAVENOUS
  Administered 2023-02-20: 30 mg/h via INTRAVENOUS
  Administered 2023-02-20: 20 mg/h via INTRAVENOUS
  Administered 2023-02-20: 32 mg/h via INTRAVENOUS
  Administered 2023-02-21: 2 mg/h via INTRAVENOUS
  Filled 2023-02-19 (×4): qty 100

## 2023-02-19 MED ORDER — FAMOTIDINE 20 MG PO TABS
20.0000 mg | ORAL_TABLET | Freq: Two times a day (BID) | ORAL | Status: DC
  Administered 2023-02-20: 20 mg
  Filled 2023-02-19: qty 1

## 2023-02-19 MED ORDER — ASPIRIN 300 MG RE SUPP
300.0000 mg | Freq: Every day | RECTAL | Status: DC
  Filled 2023-02-19: qty 1

## 2023-02-19 MED ORDER — ORAL CARE MOUTH RINSE: 15.0000 mL | OROMUCOSAL | Status: DC | PRN

## 2023-02-19 MED ORDER — POLYETHYLENE GLYCOL 3350 17 G PO PACK
17.0000 g | PACK | Freq: Every day | ORAL | Status: DC
  Administered 2023-02-20: 17 g
  Filled 2023-02-19: qty 1

## 2023-02-19 MED ORDER — PHENYLEPHRINE 80 MCG/ML (10ML) SYRINGE FOR IV PUSH (FOR BLOOD PRESSURE SUPPORT)
PREFILLED_SYRINGE | INTRAVENOUS | Status: AC
  Filled 2023-02-19: qty 10

## 2023-02-19 MED ORDER — THIAMINE MONONITRATE 100 MG PO TABS
100.0000 mg | ORAL_TABLET | Freq: Every day | ORAL | Status: AC
  Administered 2023-02-19 – 2023-02-20 (×2): 100 mg
  Filled 2023-02-19 (×2): qty 1

## 2023-02-19 MED ORDER — ROCURONIUM BROMIDE 10 MG/ML (PF) SYRINGE: 50.0000 mg | PREFILLED_SYRINGE | Freq: Once | INTRAVENOUS | Status: AC

## 2023-02-19 MED ORDER — INSULIN GLARGINE-YFGN 100 UNIT/ML ~~LOC~~ SOLN
10.0000 [IU] | Freq: Once | SUBCUTANEOUS | Status: AC
  Administered 2023-02-19: 10 [IU] via SUBCUTANEOUS
  Filled 2023-02-19: qty 0.1

## 2023-02-19 MED ORDER — ROCURONIUM BROMIDE 10 MG/ML (PF) SYRINGE
PREFILLED_SYRINGE | INTRAVENOUS | Status: AC
  Administered 2023-02-19: 50 mg via INTRAVENOUS
  Filled 2023-02-19: qty 10

## 2023-02-19 MED ORDER — NOREPINEPHRINE 4 MG/250ML-% IV SOLN
INTRAVENOUS | Status: AC
  Administered 2023-02-20: 2 ug/min via INTRAVENOUS
  Filled 2023-02-19: qty 250

## 2023-02-19 MED ORDER — MIDAZOLAM HCL 2 MG/2ML IJ SOLN
1.0000 mg | INTRAMUSCULAR | Status: DC | PRN
  Administered 2023-02-20 (×2): 2 mg via INTRAVENOUS
  Administered 2023-02-20: 1 mg via INTRAVENOUS
  Administered 2023-02-20: 2 mg via INTRAVENOUS
  Filled 2023-02-19 (×6): qty 2

## 2023-02-19 MED ORDER — AMLODIPINE BESYLATE 10 MG PO TABS
10.0000 mg | ORAL_TABLET | Freq: Every day | ORAL | Status: DC
  Administered 2023-02-19 – 2023-02-26 (×8): 10 mg
  Filled 2023-02-19 (×8): qty 1

## 2023-02-19 MED ORDER — METHYLPREDNISOLONE SODIUM SUCC 125 MG IJ SOLR: 80.0000 mg | Freq: Every day | INTRAMUSCULAR | Status: DC

## 2023-02-19 MED ORDER — FENTANYL CITRATE PF 50 MCG/ML IJ SOSY
PREFILLED_SYRINGE | INTRAMUSCULAR | Status: AC
  Administered 2023-02-19: 100 ug via INTRAVENOUS
  Filled 2023-02-19: qty 2

## 2023-02-19 MED ORDER — ASPIRIN 81 MG PO CHEW
81.0000 mg | CHEWABLE_TABLET | Freq: Every day | ORAL | Status: DC
  Administered 2023-02-20 – 2023-02-21 (×2): 81 mg
  Filled 2023-02-19 (×2): qty 1

## 2023-02-19 MED ORDER — DEXMEDETOMIDINE HCL IN NACL 400 MCG/100ML IV SOLN
0.0000 ug/kg/h | INTRAVENOUS | Status: DC
  Administered 2023-02-21: 0.2 ug/kg/h via INTRAVENOUS
  Administered 2023-02-21: 1.1 ug/kg/h via INTRAVENOUS
  Administered 2023-02-21 – 2023-02-22 (×2): 0.6 ug/kg/h via INTRAVENOUS
  Administered 2023-02-22: 0.5 ug/kg/h via INTRAVENOUS
  Administered 2023-02-22 (×2): 0.6 ug/kg/h via INTRAVENOUS
  Administered 2023-02-23: 0.4 ug/kg/h via INTRAVENOUS
  Filled 2023-02-19 (×2): qty 100
  Filled 2023-02-19: qty 200
  Filled 2023-02-19 (×5): qty 100

## 2023-02-19 MED ORDER — METHYLPREDNISOLONE SODIUM SUCC 125 MG IJ SOLR
125.0000 mg | Freq: Once | INTRAMUSCULAR | Status: AC
  Administered 2023-02-20: 125 mg via INTRAVENOUS
  Filled 2023-02-19: qty 2

## 2023-02-19 MED ORDER — MIDAZOLAM HCL 2 MG/2ML IJ SOLN: 2.0000 mg | Freq: Once | INTRAMUSCULAR | Status: AC

## 2023-02-19 MED ORDER — SUCCINYLCHOLINE CHLORIDE 200 MG/10ML IV SOSY
PREFILLED_SYRINGE | INTRAVENOUS | Status: AC
  Filled 2023-02-19: qty 10

## 2023-02-19 MED ORDER — INSULIN GLARGINE-YFGN 100 UNIT/ML ~~LOC~~ SOLN
40.0000 [IU] | Freq: Every day | SUBCUTANEOUS | Status: DC
  Filled 2023-02-19: qty 0.4

## 2023-02-19 MED ORDER — AMLODIPINE BESYLATE 10 MG PO TABS: 10.0000 mg | ORAL_TABLET | Freq: Every day | ORAL | Status: DC

## 2023-02-19 MED ORDER — HEPARIN SODIUM (PORCINE) 5000 UNIT/ML IJ SOLN
5000.0000 [IU] | Freq: Three times a day (TID) | INTRAMUSCULAR | Status: DC
  Administered 2023-02-19 – 2023-02-21 (×7): 5000 [IU] via SUBCUTANEOUS
  Filled 2023-02-19 (×7): qty 1

## 2023-02-19 MED ORDER — ETOMIDATE 2 MG/ML IV SOLN
INTRAVENOUS | Status: AC
  Administered 2023-02-19: 20 mg via INTRAVENOUS
  Filled 2023-02-19: qty 20

## 2023-02-19 MED ORDER — ORAL CARE MOUTH RINSE
15.0000 mL | OROMUCOSAL | Status: DC
  Administered 2023-02-19 (×8): 15 mL via OROMUCOSAL

## 2023-02-19 MED ORDER — MIDAZOLAM HCL 2 MG/2ML IJ SOLN
INTRAMUSCULAR | Status: AC
  Administered 2023-02-19: 2 mg via INTRAVENOUS
  Filled 2023-02-19: qty 2

## 2023-02-19 NOTE — Anesthesia Postprocedure Evaluation (Signed)
Anesthesia Post Note  Patient: Xavier [name]  Procedure(s) Performed: IR WITH ANESTHESIA     Patient location during evaluation: SICU Anesthesia Type: General Level of consciousness: sedated Pain management: pain level controlled Vital Signs Assessment: post-procedure vital signs reviewed and stable Respiratory status: patient remains intubated per anesthesia plan Cardiovascular status: stable Postop Assessment: no apparent nausea or vomiting Anesthetic complications: no   There were no known notable events for this encounter.  Last Vitals:  Vitals:   02/19/23 0630 02/19/23 0743  BP: (!) 122/52   Pulse: 60   Resp: 20   Temp:  36.5 C  SpO2: 94%     Last Pain:  Vitals:   02/19/23 0743  TempSrc: Axillary                  

## 2023-02-19 NOTE — Progress Notes (Signed)
NAME:  Xavier [name], MRN:  [id number], DOB:  1941/03/18, LOS: 2 ADMISSION DATE:  02/17/2023  History of Present Illness:  82 year old male with hypertension, diabetes, CKD 3B, who presented after acute onset left-sided weakness, facial droop, and aphasia.  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  Tenecteplase administered.  He was intubated for airway protection.  He underwent revascularization and stenting of occluded right MCA and revascularization of  occluded right PCA without stenting.  Notably, he was diagnosed with COVID and flu on same day as strokelike symptoms occurred.  Significant Hospital Events:  8/9 admitted to MICU at [facility] after acute cerebral infarct s/p TNK and thrombectomy, intubated for airway protection, antibiotics started for aspiration, antivirals started for COVID and flu  Interim History / Subjective:  Remains on vent, sedation, cleviprex.  Decreased urine outpt.  Objective   Blood pressure 119/73, pulse 65, temperature 97.7 F (36.5 C), temperature source Axillary, resp. rate (!) 21, height 6\' 2"  (1.88 m), weight 88.4 kg, SpO2 95%.    Vent Mode: PRVC FiO2 (%):  [40 %-50 %] 40 % Set Rate:  [20 bmp] 20 bmp Vt Set:  [650 mL] 650 mL PEEP:  [5 cmH20] 5 cmH20 Plateau Pressure:  [15 cmH20-21 cmH20] 17 cmH20   Intake/Output Summary (Last 24 hours) at 02/19/2023 9528 Last data filed at 02/19/2023 0930 Gross per 24 hour  Intake 2988.69 ml  Output 815 ml  Net 2173.69 ml   Filed Weights   02/17/23 2018 02/18/23 0338 02/19/23 0451  Weight: 84.4 kg 84.4 kg 88.4 kg    Examination:  General - sedated Eyes - pupils reactive ENT - ETT in place Cardiac - regular rate/rhythm, no murmur Chest - scattered rhonchi Abdomen - soft, non tender, + bowel sounds Extremities - 1+ edema Skin - no rashes Neuro - RASS -3  Resolved Hospital Problem list     Assessment & Plan:    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 - neurology, neuro-IR following   Hx of HTN, HLD, PAD, chronic HFpEF. Sinus bradycardia likely from sedation. - wean off cleviprex for goal SBP < 160 - restart norvasc 10 mg daily - hold outpt coreg in setting of bradycardia - hold outpt cozaar, dyazide in setting of AKI   Acute hypoxic respiratory failure from pneumonia. - pressure support wean - defer extubation trial until neuro status stable - f/u CXR 8/12 - goal SpO2 > 92%   Pneumonia. - likely combination of aspiration pneumonia, Influenza, and COVID 19 infections - family reports positive Influenza and COVID tests at PCP office, but testing negative in hospital >> will error side of caution and continue tamiflu and remdesivir for 5 days - continue ABx   DM type 2. - SSI with semglee  AKI from hypoxia, contrast dye. CKD 3b. - no indication for renal replacement at this time - continue NS at 50 ml/hr - f/u BMET - monitor urine outpt  Best practice (daily eval):  Diet/type: tubefeeds DVT prophylaxis: SCD GI prophylaxis: PPI Lines: Arterial Line, still needed. Left femoral sheath. Foley:  Yes, and it is still needed Code Status:  full code Last date of multidisciplinary goals of care discussion [updated his wife and daughter at bedside on 02/18/23]  Labs:      Latest Ref Rng & Units 02/19/2023    3:01  AM 02/18/2023    3:44 AM 02/18/2023    1:41 AM  CMP  Glucose 70 - 99 mg/dL 578  469  629   BUN 8 - 23 mg/dL 47  28  26   Creatinine 0.61 - 1.24 mg/dL 5.28  C 4.13  2.44   Sodium 135 - 145 mmol/L 135  133  134   Potassium 3.5 - 5.1 mmol/L 4.4  4.4  4.0   Chloride 98 - 111 mmol/L 106  105  105   CO2 22 - 32 mmol/L 20  18  19    Calcium 8.9 - 10.3 mg/dL 7.8  7.8  7.7   Total Protein 6.5 - 8.1 g/dL  5.2    Total Bilirubin 0.3 - 1.2 mg/dL  0.7    Alkaline Phos 38 - 126 U/L  64    AST 15 - 41 U/L  15    ALT 0  - 44 U/L  12      C Corrected result      Latest Ref Rng & Units 02/19/2023    3:01 AM 02/18/2023    3:44 AM 02/17/2023    8:35 PM  CBC  WBC 4.0 - 10.5 K/uL 7.8  3.6    Hemoglobin 13.0 - 17.0 g/dL 9.8  01.0  27.2   Hematocrit 39.0 - 52.0 % 30.0  35.8  35.0   Platelets 150 - 400 K/uL 144  201      ABG    Component Value Date/Time   PHART 7.413 02/17/2023 2035   PCO2ART 33.3 02/17/2023 2035   PO2ART 203 (H) 02/17/2023 2035   HCO3 21.2 02/17/2023 2035   TCO2 22 02/17/2023 2035   ACIDBASEDEF 3.0 (H) 02/17/2023 2035   O2SAT 100 02/17/2023 2035    CBG (last 3)  Recent Labs    02/18/23 2319 02/19/23 0329 02/19/23 0808  GLUCAP 216* 184* 195*    Critical care time: 33 minutes  Coralyn Helling, MD [place] [facility] Pager - [phone] or [phone] 02/19/2023, 10:01 AM

## 2023-02-19 NOTE — Progress Notes (Signed)
   02/19/23 1330  Oxygen Therapy/Pulse Ox  O2 Device (S)  HFNC;Non-rebreather Mask  $ High Flow Nasal Cannula  Yes  O2 Therapy Oxygen humidified  Heater temperature 93.2 F (34 C)  O2 Flow Rate (L/min) (S)  45 L/min  FiO2 (%) (S)  100 %  SpO2 94 %   Pt transitioned to Heated High Flow nasal cannula plus NRB mask. RN at bedside and MD updated.

## 2023-02-19 NOTE — Progress Notes (Addendum)
STROKE TEAM PROGRESS NOTE   BRIEF HPI [name] is a 82 y.o. male with history of type 2 diabetes mellitus, prostate cancer, and essential hypertension who presented to the ED via POV after a syncopal episode in the vehicle today with associated foaming at the mouth and facial asymmetry   S/p TNK and IR mechanical thrombectomy of right M1 and right P2 with stent assisted angioplasty of right M1.  Patient is COVID-positive  SIGNIFICANT HOSPITAL EVENTS 8/9 presented as code stroke received TNK at 1650.  Mechanical thrombectomy of right M1 with stent and right P2.  COVID-positive  INTERIM HISTORY/SUBJECTIVE Patient has remained intubated.  He is on propofol at 25 mcg. He is back on Cleviprex @ 14mg   No family at the bedside.  MRI brain with Multifocal acute ischemia within the right MCA territory. Petechial hemorrhage in the posterior right MCA territory, Cr is up to 3.03 today.   OBJECTIVE  CBC    Component Value Date/Time   WBC 7.8 02/19/2023 0301   RBC 3.64 (L) 02/19/2023 0301   HGB 9.8 (L) 02/19/2023 0301   HCT 30.0 (L) 02/19/2023 0301   PLT 144 (L) 02/19/2023 0301   MCV 82.4 02/19/2023 0301   MCH 26.9 02/19/2023 0301   MCHC 32.7 02/19/2023 0301   RDW 13.6 02/19/2023 0301   LYMPHSABS 2.0 02/17/2023 1550   MONOABS 0.6 02/17/2023 1550   EOSABS 0.1 02/17/2023 1550   BASOSABS 0.1 02/17/2023 1550    BMET    Component Value Date/Time   NA 135 02/19/2023 0301   K 4.4 02/19/2023 0301   CL 106 02/19/2023 0301   CO2 20 (L) 02/19/2023 0301   GLUCOSE 195 (H) 02/19/2023 0301   BUN 47 (H) 02/19/2023 0301   CREATININE 3.03 (H) 02/19/2023 0301   CALCIUM 7.8 (L) 02/19/2023 0301   GFRNONAA 20 (L) 02/19/2023 0301    IMAGING past 24 hours DG Chest Port 1 View  Result Date: 02/19/2023 CLINICAL DATA:  Pneumonia EXAM: PORTABLE CHEST 1 VIEW COMPARISON:  Chest radiograph from one day prior. FINDINGS: Endotracheal tube tip is 5.4 cm above the carina. Enteric tube terminates in  the gastric fundus. Esophageal temperature probe terminates over the mid thoracic esophagus just below the carina. Stable cardiomediastinal silhouette with normal heart size. No pneumothorax. No pleural effusion. Hazy patchy bibasilar lung opacities, similar. IMPRESSION: 1. Well-positioned support structures. No pneumothorax. 2. Hazy patchy bibasilar lung opacities, similar. Electronically Signed   By: Delbert Phenix M.D.   On: 02/19/2023 09:36   MR BRAIN WO CONTRAST  Result Date: 02/18/2023 CLINICAL DATA:  Stroke follow-up EXAM: MRI HEAD WITHOUT CONTRAST TECHNIQUE: Multiplanar, multiecho pulse sequences of the brain and surrounding structures were obtained without intravenous contrast. COMPARISON:  12/10/2017 FINDINGS: Brain: Multifocal acute ischemia within the right MCA territory. Petechial hemorrhage in the posterior right MCA territory. There is multifocal hyperintense T2-weighted signal within the white matter. Generalized volume loss. The midline structures are normal. Vascular: Major flow voids are preserved. Skull and upper cervical spine: Normal calvarium and skull base. Visualized upper cervical spine and soft tissues are normal. Sinuses/Orbits:No paranasal sinus fluid levels or advanced mucosal thickening. No mastoid or middle ear effusion. Normal orbits. IMPRESSION: Multifocal acute ischemia within the right MCA territory. Petechial hemorrhage in the posterior right MCA territory, Heidelberg classification 1b: HI2, confluent petechiae, no mass effect. Electronically Signed   By: Deatra Robinson M.D.   On: 02/18/2023 23:00   ECHOCARDIOGRAM COMPLETE  Result Date: 02/18/2023    ECHOCARDIOGRAM  REPORT   Patient Name:   Xavier [name] Date of Exam: 02/18/2023 Medical Rec #:  [id number]        Height:       74.0 in Accession #:    [id number]       Weight:       186.1 lb Date of Birth:  1941/01/25        BSA:          2.108 m Patient Age:    81 years         BP:           148/53 mmHg Patient Gender: M                 HR:           54 bpm. Exam Location:  Inpatient Procedure: 2D Echo, Color Doppler and Cardiac Doppler Indications:    stroke  History:        Patient has prior history of Echocardiogram examinations, most                 recent 04/30/2020. Chronic kidney disease,                 Signs/Symptoms:Murmur; Risk Factors:Hypertension, Dyslipidemia                 and Diabetes.  Sonographer:    Delcie Roch RDCS Referring Phys: [id number] Reyne Dumas [facility]  Sonographer Comments: Technically difficult study due to poor echo windows and echo performed with patient supine and on artificial respirator. Image acquisition challenging due to respiratory motion. IMPRESSIONS  1. Left ventricular ejection fraction, by estimation, is 55 to 60%. The left ventricle has normal function. The left ventricle has no regional wall motion abnormalities. There is moderate concentric left ventricular hypertrophy. Left ventricular diastolic parameters are consistent with Grade II diastolic dysfunction (pseudonormalization).  2. Right ventricular systolic function is normal. The right ventricular size is not well visualized. There is normal pulmonary artery systolic pressure.  3. A small pericardial effusion is present. The pericardial effusion is anterior to the right ventricle. There is no evidence of cardiac tamponade.  4. The mitral valve is degenerative. No evidence of mitral valve regurgitation.  5. The aortic valve is tricuspid. There is moderate calcification of the aortic valve. Aortic valve regurgitation is not visualized. Aortic valve sclerosis is present, with no evidence of aortic valve stenosis.  6. The inferior vena cava is dilated in size with >50% respiratory variability, suggesting right atrial pressure of 8 mmHg. Comparison(s): No significant change from prior study. Prior images reviewed side by side. FINDINGS  Left Ventricle: Left ventricular ejection fraction, by estimation, is 55 to 60%. The left ventricle has  normal function. The left ventricle has no regional wall motion abnormalities. The left ventricular internal cavity size was normal in size. There is  moderate concentric left ventricular hypertrophy. Left ventricular diastolic parameters are consistent with Grade II diastolic dysfunction (pseudonormalization). Right Ventricle: The right ventricular size is not well visualized. Right vetricular wall thickness was not well visualized. Right ventricular systolic function is normal. There is normal pulmonary artery systolic pressure. The tricuspid regurgitant velocity is 2.43 m/s, and with an assumed right atrial pressure of 8 mmHg, the estimated right ventricular systolic pressure is 31.6 mmHg. Left Atrium: Left atrial size was normal in size. Right Atrium: Right atrial size was normal in size. Pericardium: A small pericardial effusion is present. The pericardial effusion is anterior to the right ventricle.  There is no evidence of cardiac tamponade. Mitral Valve: The mitral valve is degenerative in appearance. No evidence of mitral valve regurgitation. Tricuspid Valve: The tricuspid valve is normal in structure. Tricuspid valve regurgitation is not demonstrated. No evidence of tricuspid stenosis. Aortic Valve: The aortic valve is tricuspid. There is moderate calcification of the aortic valve. Aortic valve regurgitation is not visualized. Aortic valve sclerosis is present, with no evidence of aortic valve stenosis. Pulmonic Valve: The pulmonic valve was normal in structure. Pulmonic valve regurgitation is not visualized. No evidence of pulmonic stenosis. Aorta: The aortic root is normal in size and structure. Venous: The inferior vena cava is dilated in size with greater than 50% respiratory variability, suggesting right atrial pressure of 8 mmHg. IAS/Shunts: No atrial level shunt detected by color flow Doppler.  LEFT VENTRICLE PLAX 2D LVIDd:         4.20 cm   Diastology LVIDs:         3.00 cm   LV e' medial:    3.92  cm/s LV PW:         1.10 cm   LV E/e' medial:  16.9 LV IVS:        1.30 cm   LV e' lateral:   4.90 cm/s LVOT diam:     2.00 cm   LV E/e' lateral: 13.5 LV SV:         51 LV SV Index:   24 LVOT Area:     3.14 cm  RIGHT VENTRICLE             IVC RV S prime:     10.00 cm/s  IVC diam: 2.30 cm TAPSE (M-mode): 1.8 cm LEFT ATRIUM           Index LA diam:      2.50 cm 1.19 cm/m LA Vol (A4C): 22.2 ml 10.53 ml/m  AORTIC VALVE LVOT Vmax:   68.30 cm/s LVOT Vmean:  42.600 cm/s LVOT VTI:    0.163 m  AORTA Ao Root diam: 3.20 cm MITRAL VALVE               TRICUSPID VALVE MV Area (PHT): 2.06 cm    TR Peak grad:   23.6 mmHg MV Decel Time: 369 msec    TR Vmax:        243.00 cm/s MV E velocity: 66.30 cm/s MV A velocity: 60.20 cm/s  SHUNTS MV E/A ratio:  1.10        Systemic VTI:  0.16 m                            Systemic Diam: 2.00 cm Riley Lam MD Electronically signed by Riley Lam MD Signature Date/Time: 02/18/2023/6:51:28 PM    Final     Vitals:   02/19/23 1130 02/19/23 1145 02/19/23 1150 02/19/23 1200  BP:    (!) 145/53  Pulse: 68 65  70  Resp: 20 (!) 22  (!) 23  Temp:  99.1 F (37.3 C)    TempSrc:  Esophageal    SpO2: 94% 92% 92% (!) 88%  Weight:      Height:         PHYSICAL EXAM General: Sedated and intubated elderly African-American male in no apparent distress Psych:  Mood and affect appropriate for situation CV: Regular rate and rhythm on monitor Respiratory:  Regular, unlabored respirations on room air GI: Abdomen soft and nontender   NEURO:  Mental Status: Exam  is limited to sedation.  Eyes are closed does not open eyes to voice or tactile stimulation with sedation paused does not follow commands Speech/Language: Not able to assess due to ET tube  Cranial Nerves:  II: PERRL.  Unable to assess visual fields due to sedation III, IV, VI: EOMI. eyes are midline V: Sensation is intact to light touch and symmetrical to face.  VII: unAble to assess due to VIII: hearing  intact to voice. IX, X: Unable to assess XI: Unable to assess XII: tongue is midline without fasciculations. Motor: Right arm and leg with minimal withdrawal no movement of left hemibody Tone: is normal and bulk is normal Sensation-once to noxious stimuli as above Coordination: Unable to assess Gait- deferred   ASSESSMENT/PLAN  Acute Ischemic Infarct:  right MCA/PCA s/p TNK and mechanical thrombectomy with stent placement in right MCA Etiology: Embolic versus hypercoagulable state Code Stroke  CT head cute right MCA and PCA territory infarct ASPECTS 8   CTA head & neck  Occlusion of the right M1 segment and right PCA at the P1 P2 junction. 2. Severe narrowing in the supraclinoid ICA on the left and moderate to severe narrowing of the supraclinoid ICA on the right. 3. Moderate narrowing in the distal petrous segment of the left ICA. Post IR CT  patchy focal areas of hyperattenuation in the right parietal occipital region contrast staining versus subarachnoid hemorrhage/petechial hemorrhages.  Repeat CT head Persistent but regressed right occipital hyperdensity since last  night. Favor subarachnoid hemorrhage. Underlying right PCA territory cytotoxic edema or encephalomalacia. MRI Multifocal acute ischemia within the right MCA territory. Petechial hemorrhage in the posterior right MCA territory, 2D Echo EF 55-60%. LV with moderate concentric hypertrophy. Grade II diastolic dysfunction  LDL 101 [id number] 9.0 VTE prophylaxis -heparin SQ aspirin 81 mg daily prior to admission, now on aspirin 81 mg daily and Brilinta 60 mg twice daily  Therapy recommendations: Pending Disposition: Pending   Hypertension Home meds: Norvasc 10 mg, Coreg 25 mg twice daily, losartan 25 mg daily, Dyazide  Stable Blood Pressure Goal: SBP less than 160  Norvasc added back   Hyperlipidemia Home meds: Atorvastatin 80 mg, resumed in hospital LDL 101, goal < 70 Continue statin at discharge  Diabetes type II  UnControlled Home meds: Toujeo, Farxiga HgbA1c 9.0, goal < 7.0 CBGs SSI Recommend close follow-up with PCP for better DM control  Former tobacco Abuse Noted  Acute hypoxic respiratory failure with COVID-19 and flu ?  Aspiration pneumonia Managed per CCM  Propofol for sedation On ceftriaxone and azithromycin On remdesivir  Dysphagia Patient has post-stroke dysphagia, SLP consulted    Diet   Diet NPO time specified   Consider starting tube feeds   Other Active Problems CKD  Hospital day # 2  Gevena Mart DNP, ACNPC-AG  [facility]  I have personally obtained history,examined this patient, reviewed notes, independently viewed imaging studies, participated in medical decision making and plan of care.ROS completed by me personally and pertinent positives fully documented  I have made any additions or clarifications directly to the above note. Agree with note above.  Remains intubated on neurological exam is limited due to sedation was able to withdraw the right side more than left side.  MRI shows patchy right MCA infarct.  Subarachnoid hemorrhage.  Continue aspirin and Brilinta for his stent recommend extubate as tolerated per critical care team.  Management of COVID and pneumonia respiratory protecting.  Long discussion at bedside with patient's mother and multiple other family members  at the bedside during the afternoon and answered questions.  Discussed with Dr.Sood This patient is critically ill and at significant risk of neurological worsening, death and care requires constant monitoring of vital signs, hemodynamics,respiratory and cardiac monitoring, extensive review of multiple databases, frequent neurological assessment, discussion with family, other specialists and medical decision making of high complexity.I have made any additions or clarifications directly to the above note.This critical care time does not reflect procedure time, or teaching time or supervisory  time of PA/NP/Med Resident etc but could involve care discussion time.  I spent 30 minutes of neurocritical care time  in the care of  this patient.     Delia Heady, MD Medical Director [facility] Pager: [phone] 02/19/2023 3:10 PM    To contact Stroke Continuity provider, please refer to [email or web address]. After hours, contact General Neurology

## 2023-02-19 NOTE — Progress Notes (Addendum)
PCCM called to bedside to assess patient with worsening respiratory failure and increased o2 requirements. Patient also agitated on precedex. Patient on NRB and HHFNC 100% w/ sats ranging 80-90s. Patient w/ accessory muscle use. NT suctioned w/ thick secretions.   Acute respiratory failure w/ hypoxia Plan: -will intubate patient and place on mech vent -will start on iv steroids -fentanyl and prn versed for sedation -CXR and abg post intubation  Critical care time: 35 minutes  JD Anselm Lis  Pulmonary & Critical Care 02/19/2023, 11:46 PM  Please see [email or web address] for pager details.  From 7A-7P if no response, please call [phone]. After hours, please call ELink [phone].

## 2023-02-19 NOTE — Progress Notes (Signed)
eLink Physician-Brief Progress Note Patient Name: Xavier [name] DOB: [birth year] MRN: [id number]   Date of Service  02/19/2023  HPI/Events of Note  Since extubation today has had worsening respiratory status and currently on HHFNC 100% 45L and NRB. Maintaining sats 94% Agitation worsens this  eICU Interventions  Posey belt ordered Request for ground team to surveillance     Intervention Category Minor Interventions: Agitation / anxiety - evaluation and management   Mechele Collin 02/19/2023, 10:50 PM

## 2023-02-19 NOTE — Progress Notes (Signed)
Pt noted to have intermittent R-arm twitching approx every min lasting 1-3 seconds. Otelia Limes, MD w/ neurology notified. Orders to continue to monitor for seizure-like activity.

## 2023-02-19 NOTE — Procedures (Signed)
Intubation Procedure Note  Xavier [name]  [id number]  03-30-1941  Date:02/19/23  Time:11:41 PM   Provider Performing: V.     Procedure: Intubation (31500)  Indication(s) Respiratory Failure  Consent Unable to obtain consent due to emergent nature of procedure.   Anesthesia Etomidate, Versed, Fentanyl, and Rocuronium   Time Out Verified patient identification, verified procedure, site/side was marked, verified correct patient position, special equipment/implants available, medications/allergies/relevant history reviewed, required imaging and test results available.   Sterile Technique Usual hand hygeine, masks, and gloves were used   Procedure Description Patient positioned in bed supine.  Sedation given as noted above.  Patient was intubated with endotracheal tube using Glidescope.  View was Grade 1 full glottis .  Number of attempts was 1.  Colorimetric CO2 detector was consistent with tracheal placement.   Complications/Tolerance None; patient tolerated the procedure well. Chest X-ray is ordered to verify placement.   EBL Minimal   Specimen(s) None   V. Vassie Loll MD

## 2023-02-19 NOTE — Progress Notes (Cosign Needed)
  Covid + Code stroke 8/9 Status post complete revascularization of occluded right middle cerebral artery with 1 pass with 055 zoom complex aspiration achieving aTICI 3 revascularization.Marland Kitchen  Reocclusion due to underlying severe atherosclerosis requiring revascularization with stent assisted angioplasty using a 2 x 15 mm Onyx balloon mandible stent with return toTICI 3 revascularization Revascularization of the right posterior cerebral P1 P2 junction with mechanical thrombolysis with an O2 8 microguidewire.  Spoke to [employer] today  Pt is still intubated and sedated Does withdraw from pain on Rt Left leg moves spontaneously No movement of left arm  MR Brain yesterday: IMPRESSION: Multifocal acute ischemia within the right MCA territory. Petechial hemorrhage in the posterior right MCA territory, Heidelberg classification 1b: HI2, confluent petechiae, no mass effect.  Discussed with Dr Corliss Skains  Will follow Plans per Stroke Team

## 2023-02-19 NOTE — Progress Notes (Signed)
PT Cancellation Note  Patient Details Name: Xavier [name] MRN: [id number] DOB: [birth year]   Cancelled Treatment:    Reason Eval/Treat Not Completed: Patient not medically ready; will follow up for appropriateness of PT evaluation.  Ina Homes, PT, DPT Acute Rehabilitation Services  Personal: Secure Chat Rehab Office: [phone]  Malachy Chamber 02/19/2023, 7:34 AM

## 2023-02-19 NOTE — Procedures (Signed)
Extubation Procedure Note  Patient Details:   Name: Xavier [name] DOB: 02-19-1941 MRN: [id number]   Airway Documentation:    Vent end date: 02/19/23 Vent end time: 1150   Evaluation  O2 sats: stable throughout Complications: No apparent complications Patient did tolerate procedure well. Bilateral Breath Sounds: Diminished, Rhonchi   Yes  Pt extubated per physician order. Pt with positive cuff leak and suctioned via ETT/orally prior. Upon extubation pt titrated to 15L salter HFNC. Pt able to mumble but does not purposefully respond to staff when asked to speak. No stridor heard at this time and pt with good cough. RT will continue to monitor and be available as needed   Derinda Late 02/19/2023, 12:04 PM

## 2023-02-19 NOTE — Progress Notes (Signed)
Dr. Craige Cotta updated on patients increased oxygen needs and switch to [facility]. SpO2 now 92-94%. Pt remains restless. No new orders received. RT will continue to monitor and be available as needed.

## 2023-02-20 ENCOUNTER — Inpatient Hospital Stay (HOSPITAL_COMMUNITY): Payer: PPO

## 2023-02-20 ENCOUNTER — Encounter (HOSPITAL_COMMUNITY): Payer: Self-pay | Admitting: Radiology

## 2023-02-20 DIAGNOSIS — Z7189 Other specified counseling: Secondary | ICD-10-CM

## 2023-02-20 DIAGNOSIS — I639 Cerebral infarction, unspecified: Secondary | ICD-10-CM | POA: Diagnosis not present

## 2023-02-20 DIAGNOSIS — Z515 Encounter for palliative care: Secondary | ICD-10-CM | POA: Diagnosis not present

## 2023-02-20 LAB — RESPIRATORY PANEL BY PCR

## 2023-02-20 LAB — POCT I-STAT 7, (LYTES, BLD GAS, ICA,H+H)
Acid-base deficit: 6 mmol/L — ABNORMAL HIGH (ref 0.0–2.0)
Bicarbonate: 17.9 mmol/L — ABNORMAL LOW (ref 20.0–28.0)
Calcium, Ion: 1.08 mmol/L — ABNORMAL LOW (ref 1.15–1.40)
HCT: 26 % — ABNORMAL LOW (ref 39.0–52.0)
Hemoglobin: 8.8 g/dL — ABNORMAL LOW (ref 13.0–17.0)
O2 Saturation: 100 %
Patient temperature: 98.6
Potassium: 3.7 mmol/L (ref 3.5–5.1)
Sodium: 139 mmol/L (ref 135–145)
TCO2: 19 mmol/L — ABNORMAL LOW (ref 22–32)
pCO2 arterial: 27.9 mmHg — ABNORMAL LOW (ref 32–48)
pH, Arterial: 7.416 (ref 7.35–7.45)
pO2, Arterial: 274 mmHg — ABNORMAL HIGH (ref 83–108)

## 2023-02-20 LAB — TRIGLYCERIDES: Triglycerides: 156 mg/dL — ABNORMAL HIGH (ref <150)

## 2023-02-20 LAB — GLUCOSE, CAPILLARY
Glucose-Capillary: 118 mg/dL — ABNORMAL HIGH (ref 70–99)
Glucose-Capillary: 176 mg/dL — ABNORMAL HIGH (ref 70–99)
Glucose-Capillary: 193 mg/dL — ABNORMAL HIGH (ref 70–99)
Glucose-Capillary: 305 mg/dL — ABNORMAL HIGH (ref 70–99)
Glucose-Capillary: 329 mg/dL — ABNORMAL HIGH (ref 70–99)
Glucose-Capillary: 399 mg/dL — ABNORMAL HIGH (ref 70–99)
Glucose-Capillary: 401 mg/dL — ABNORMAL HIGH (ref 70–99)
Glucose-Capillary: 66 mg/dL — ABNORMAL LOW (ref 70–99)
Glucose-Capillary: 96 mg/dL (ref 70–99)

## 2023-02-20 LAB — CULTURE, RESPIRATORY W GRAM STAIN: Gram Stain: NONE SEEN

## 2023-02-20 LAB — SARS CORONAVIRUS 2 BY RT PCR: SARS Coronavirus 2 by RT PCR: NEGATIVE

## 2023-02-20 LAB — PROCALCITONIN: Procalcitonin: 47.18 ng/mL

## 2023-02-20 MED ORDER — PROPOFOL 1000 MG/100ML IV EMUL
INTRAVENOUS | Status: AC
  Administered 2023-02-20: 5 ug/kg/min via INTRAVENOUS
  Filled 2023-02-20: qty 100

## 2023-02-20 MED ORDER — PIVOT 1.5 CAL PO LIQD
1000.0000 mL | ORAL | Status: DC
  Administered 2023-02-20: 1000 mL
  Filled 2023-02-20: qty 1000

## 2023-02-20 MED ORDER — ORAL CARE MOUTH RINSE
15.0000 mL | OROMUCOSAL | Status: DC
  Administered 2023-02-20 – 2023-02-21 (×16): 15 mL via OROMUCOSAL

## 2023-02-20 MED ORDER — DEXTROSE 50 % IV SOLN: 12.5000 g | Freq: Once | INTRAVENOUS | Status: AC

## 2023-02-20 MED ORDER — INSULIN GLARGINE-YFGN 100 UNIT/ML ~~LOC~~ SOLN
40.0000 [IU] | Freq: Two times a day (BID) | SUBCUTANEOUS | Status: DC
  Administered 2023-02-20: 40 [IU] via SUBCUTANEOUS
  Filled 2023-02-20 (×2): qty 0.4

## 2023-02-20 MED ORDER — PROSOURCE TF20 ENFIT COMPATIBL EN LIQD
60.0000 mL | Freq: Every day | ENTERAL | Status: DC
  Administered 2023-02-20 – 2023-02-21 (×2): 60 mL
  Filled 2023-02-20 (×2): qty 60

## 2023-02-20 MED ORDER — DEXTROSE 50 % IV SOLN
INTRAVENOUS | Status: AC
  Administered 2023-02-20: 12.5 g via INTRAVENOUS
  Filled 2023-02-20: qty 50

## 2023-02-20 MED ORDER — CALCIUM GLUCONATE-NACL 2-0.675 GM/100ML-% IV SOLN
2.0000 g | Freq: Once | INTRAVENOUS | Status: AC
  Administered 2023-02-20: 2000 mg via INTRAVENOUS
  Filled 2023-02-20: qty 100

## 2023-02-20 MED ORDER — FAMOTIDINE 20 MG PO TABS
20.0000 mg | ORAL_TABLET | Freq: Every day | ORAL | Status: DC
  Administered 2023-02-20 – 2023-02-21 (×2): 20 mg
  Filled 2023-02-20 (×2): qty 1

## 2023-02-20 MED ORDER — PROPOFOL 1000 MG/100ML IV EMUL
5.0000 ug/kg/min | INTRAVENOUS | Status: DC
  Administered 2023-02-20: 15 ug/kg/min via INTRAVENOUS
  Administered 2023-02-21: 25 ug/kg/min via INTRAVENOUS
  Filled 2023-02-20 (×3): qty 100

## 2023-02-20 MED ORDER — PIPERACILLIN-TAZOBACTAM IN DEX 2-0.25 GM/50ML IV SOLN
2.2500 g | Freq: Four times a day (QID) | INTRAVENOUS | Status: DC
  Administered 2023-02-20 – 2023-02-24 (×17): 2.25 g via INTRAVENOUS
  Filled 2023-02-20 (×18): qty 50

## 2023-02-20 MED ORDER — INSULIN GLARGINE-YFGN 100 UNIT/ML ~~LOC~~ SOLN
30.0000 [IU] | Freq: Every day | SUBCUTANEOUS | Status: DC
  Administered 2023-02-20: 30 [IU] via SUBCUTANEOUS
  Filled 2023-02-20: qty 0.3

## 2023-02-20 MED ORDER — NOREPINEPHRINE 4 MG/250ML-% IV SOLN
2.0000 ug/min | INTRAVENOUS | Status: DC
  Administered 2023-02-20: 2 ug/min via INTRAVENOUS

## 2023-02-20 MED ORDER — VITAL 1.5 CAL PO LIQD
1000.0000 mL | ORAL | Status: DC
  Administered 2023-02-20 – 2023-02-21 (×2): 1000 mL

## 2023-02-20 NOTE — Progress Notes (Signed)
PT Cancellation Note  Patient Details Name: Xavier [name] MRN: [id number] DOB: May 29, 1941   Cancelled Treatment:    Reason Eval/Treat Not Completed: Patient not medically ready. Pt reintubated last night. Will monitor for appropriateness for therapy.    Angelina Ok [facility] 02/20/2023, 10:18 AM Skip Mayer PT Acute Rehabilitation Services Office [phone]

## 2023-02-20 NOTE — Progress Notes (Signed)
eLink Physician-Brief Progress Note Patient Name: ZAYEN MCWILLIAMS DOB: 22-Jun-1941 MRN: [id number]   Date of Service  02/20/2023  HPI/Events of Note  Watery stool x 3. No hemorrhoids or rectal breakdown/injury reported by [facility]  eICU Interventions  Flexiseal ordered Hold bowel regimen     Intervention Category Minor Interventions: Other:   Mechele Collin 02/20/2023, 11:02 PM

## 2023-02-20 NOTE — TOC CM/SW Note (Signed)
Patient admitted for stroke. Patient is intubated and on Cleviprex drip.  Palliative is consulted.  TOC following.

## 2023-02-20 NOTE — Progress Notes (Signed)
Nutrition Follow-up  DOCUMENTATION CODES:   Not applicable  INTERVENTION:  Adjust tube feeding via NGT: Glucerna 1.5 at 55 ml/h (1320 ml per day) Provides 1980 kcal, 109 gm protein, 1002 ml free water daily Exchange NGT to cortrak tube 8/14  NUTRITION DIAGNOSIS:   Inadequate oral intake related to acute illness as evidenced by NPO status. - remains applicable  GOAL:   Patient will meet greater than or equal to 90% of their needs - progressing, being met with TF at goal  MONITOR:   Labs, Weight trends, Vent status, TF tolerance  REASON FOR ASSESSMENT:   Consult, Ventilator Enteral/tube feeding initiation and management  ASSESSMENT:   Pt admitted with acute L-sided weakness d/t acute ischemic stroke following outpatient MD appointment where he was diagnosed with COVID and flu however pt tested negative on admission. PMH significant for T2DM, prostate cancer, essential HTN, CKD IIIb.  8/9 - s/p TNK, thrombectomy, intubated for airway protection 8/10 - tube feeding initiated (OGT side port gastric) 8/11 - extubated, re-intubated 8/13 - extubated, NGT remains in place  Pt able to be extubated this AM. Visited at bedside and pt restless, moving in bed a lot and attempting to pull lines and tubes. Mittens being applied and precedex being increased.   NGT remains in place, TF have not been restarted yet after being held for extubation. CBGs very elevated and pt required insulin drip overnight to manage. Will adjust to lower carb formula. DM coordinator adjusting insulin regimen for better control. Discussed with wife at bedside, no concerns voiced at this time.  Discussed with MD, will exchanged NGT for cortrak tube tomorrow if pt still altered and requiring enteral feeds.    Intake/Output Summary (Last 24 hours) at 02/21/2023 1220 Last data filed at 02/21/2023 1200 Gross per 24 hour  Intake 2983.42 ml  Output 498 ml  Net 2485.42 ml  Net IO Since Admission: 10,268.16 mL  [02/21/23 1220]  Nutritionally Relevant Medications: Scheduled Meds:  atorvastatin  80 mg Per Tube Daily   docusate  100 mg Per Tube BID   furosemide  80 mg Intravenous Q8H   insulin aspart  0-20 Units Subcutaneous Q4H   insulin glargine-yfgn  45 Units Subcutaneous Daily   Continuous Infusions:  albumin human 25 g (02/21/23 0942)   dexmedetomidine (PRECEDEX) IV infusion 0.4 mcg/kg/hr (02/21/23 1140)   feeding supplement (GLUCERNA 1.5 CAL)     norepinephrine (LEVOPHED) Adult infusion Stopped (02/20/23 1748)   piperacillin-tazobactam (ZOSYN)  IV 2.25 g (02/21/23 1154)   PRN Meds: polyethylene glycol, senna-docusate  Labs Reviewed: Na 134 K 5.2 BUN 88, creatinine 3.81 CBG ranges from 176-401 mg/dL over the last 24 hours  NUTRITION - FOCUSED PHYSICAL EXAM:  Flowsheet Row Most Recent Value  Orbital Region No depletion  Upper Arm Region No depletion  Thoracic and Lumbar Region No depletion  Buccal Region No depletion  Temple Region No depletion  Clavicle Bone Region No depletion  Clavicle and Acromion Bone Region No depletion  Scapular Bone Region No depletion  Dorsal Hand Unable to assess  [mittens]  Patellar Region Mild depletion  Anterior Thigh Region Moderate depletion  Posterior Calf Region Mild depletion  Edema (RD Assessment) None  Hair Reviewed  Eyes Reviewed  Mouth Reviewed  Skin Reviewed  Nails Reviewed    Diet Order:   Diet Order             Diet NPO time specified  Diet effective now  EDUCATION NEEDS:   Not appropriate for education at this time  Skin:  Skin Assessment: Reviewed RN Assessment  Last BM:  8/12 - type 7, FMS in place  Height:   Ht Readings from Last 1 Encounters:  02/17/23 6\' 2"  (1.88 m)    Weight:   Wt Readings from Last 1 Encounters:  02/21/23 87.2 kg    Ideal Body Weight:  86.4 kg  BMI:  Body mass index is 24.68 kg/m.  Estimated Nutritional Needs:  Kcal:  2000-2200 Protein:   100-115g Fluid:  >/=2L    Greig Castilla, RD, LDN Clinical Dietitian RD pager # available in [facility]  After hours/weekend pager # available in [facility]

## 2023-02-20 NOTE — Progress Notes (Signed)
SLP Cancellation Note  Patient Details Name: Xavier [name] MRN: [id number] DOB: August 20, 1940   Cancelled treatment:       Reason Eval/Treat Not Completed: Patient not medically ready. Intubated. Will sign off and await new orders.    , [name] 02/20/2023, 7:48 AM

## 2023-02-20 NOTE — Progress Notes (Signed)
IVT consult placed for PIV assessment/placement. Patient has 3 PIV's with 1 currently leaking. IV removed. Both arms assessed. 1 new PIV placed, however if need or additional access is needed, please consider PICC or central line due to extremely limited peripheral vasculature.    Loyola Mast, RN

## 2023-02-20 NOTE — Progress Notes (Addendum)
STROKE TEAM PROGRESS NOTE   BRIEF HPI [name] is a 82 y.o. male with history of type 2 diabetes mellitus, prostate cancer, and essential hypertension who presented to the ED via POV after a syncopal episode in the vehicle today with associated foaming at the mouth and facial asymmetry   S/p TNK and IR mechanical thrombectomy of right M1 and right P2 with stent assisted angioplasty of right M1.  Patient is COVID-positive  SIGNIFICANT HOSPITAL EVENTS 8/9 presented as code stroke received TNK at 1650.  Mechanical thrombectomy of right M1 with stent and right P2.  COVID-positive 8/11 reintubated for hypoxia   INTERIM HISTORY/SUBJECTIVE Patient was reintubated yesterday due to hypoxia. He is on cleviprex @ 32mg . He is also on fentanyl @ .  Wife is at the bedside.  Patient is responsive to voice and follows commands, moving all extremities antigravity. No blink to threat on left.   OBJECTIVE  CBC    Component Value Date/Time   WBC 10.4 02/20/2023 0032   RBC 3.53 (L) 02/20/2023 0032   HGB 9.5 (L) 02/20/2023 0032   HCT 28.7 (L) 02/20/2023 0032   PLT 151 02/20/2023 0032   MCV 81.3 02/20/2023 0032   MCH 26.9 02/20/2023 0032   MCHC 33.1 02/20/2023 0032   RDW 13.4 02/20/2023 0032   LYMPHSABS 2.0 02/17/2023 1550   MONOABS 0.6 02/17/2023 1550   EOSABS 0.1 02/17/2023 1550   BASOSABS 0.1 02/17/2023 1550    BMET    Component Value Date/Time   NA 139 02/20/2023 0032   K 3.8 02/20/2023 0032   CL 105 02/20/2023 0032   CO2 17 (L) 02/20/2023 0032   GLUCOSE 73 02/20/2023 0032   BUN 58 (H) 02/20/2023 0032   CREATININE 3.40 (H) 02/20/2023 0032   CALCIUM 8.1 (L) 02/20/2023 0032   GFRNONAA 17 (L) 02/20/2023 0032    IMAGING past 24 hours Portable Chest x-ray  Result Date: 02/19/2023 CLINICAL DATA:  [id number] Endotracheal tube present [id number] EXAM: PORTABLE CHEST 1 VIEW COMPARISON:  Chest x-ray [id number] a.m., CT chest 09/28/2022 FINDINGS: Endotracheal tube with tip terminating  5 cm above the carina. Enteric tube coursing below the hemidiaphragm with tip and side port collimated off view. The heart and mediastinal contours are within normal limits. Interval worsening of right hilar consolidation. Persistent grossly stable fluffy airspace and interstitial opacities. No pulmonary edema. No pleural effusion. Left costophrenic angle collimated off view. No pneumothorax. No acute osseous abnormality. IMPRESSION: 1. Lines and tubes as above. 2. Interval worsening of right hilar consolidation. 3. Persistent grossly stable fluffy airspace and interstitial opacities. Electronically Signed   By: Tish Frederickson M.D.   On: 02/19/2023 23:54   CT HEAD WO CONTRAST ( )  Result Date: 02/19/2023 CLINICAL DATA:  Stroke follow-up, lethargy EXAM: CT HEAD WITHOUT CONTRAST TECHNIQUE: Contiguous axial images were obtained from the base of the skull through the vertex without intravenous contrast. RADIATION DOSE REDUCTION: This exam was performed according to the departmental dose-optimization program which includes automated exposure control, adjustment of the mA and/or kV according to patient size and/or use of iterative reconstruction technique. COMPARISON:  Brain MRI and CT head 1 day prior FINDINGS: Brain: There is hypodensity in the right insula and parieto-occipital region consistent with evolving acute infarcts in the MCA distribution, better seen on the MRI from 1 day prior. Petechial hemorrhage is again seen at both locations. There is no mass effect or midline shift There is no new acute territorial infarct. There is no other acute  intracranial hemorrhage or extra-axial fluid collection. The pituitary and suprasellar region are normal. There is no solid mass lesion. There is no midline shift. Vascular: A right MCA stent is noted. Skull: Normal. Negative for fracture or focal lesion. Sinuses/Orbits: Small retention cysts are noted in the maxillary sinuses. Bilateral lens implants and a left  scleral buckle are noted. Other: The mastoid air cells and middle ear cavities are clear. 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 Electronically Signed   By: Lesia Hausen M.D.   On: 02/19/2023 19:24   DG Abd Portable 1V  Result Date: 02/19/2023 CLINICAL DATA:  [id number] Encounter for imaging study to confirm nasogastric (NG) tube placement [id number] EXAM: PORTABLE ABDOMEN - 1 VIEW COMPARISON:  02/28/2022 FINDINGS: Limited radiograph of the lower chest and upper abdomen was obtained for the purposes of enteric tube localization. Enteric tube is seen coursing below the diaphragm with distal tip and side port terminating within the expected location of the gastric body. Right greater than left bibasilar airspace consolidations. IMPRESSION: Enteric tube terminates within the expected location of the gastric body. Electronically Signed   By: Duanne Guess D.O.   On: 02/19/2023 17:47    Vitals:   02/20/23 1345 02/20/23 1400 02/20/23 1415 02/20/23 1430  BP:      Pulse: 62 62 66 66  Resp: 20 20 (!) 21 20  Temp:      TempSrc:      SpO2: 96% 96% 96% 95%  Weight:      Height:         PHYSICAL EXAM General: Sedated and intubated elderly African-American male in no apparent distress Psych:  Mood and affect appropriate for situation CV: Regular rate and rhythm on monitor Respiratory:  Regular, unlabored respirations on room air GI: Abdomen soft and nontender   NEURO:  Mental Status: Exam is on Fentanyl.  Eyes are closed does  open eyes to voice and does follow commands with Speech/Language: Not able to assess due to ET tube  Cranial Nerves:  II: PERRL.  III, IV, VI: EOMI. eyes are midline no blink to threat on left.  V: Sensation is intact to light touch and symmetrical to face.  VII: No facial weakness  VIII: hearing intact to voice. IX, X: Unable to assess XI: Unable to assess XII: tongue is midline without  fasciculations. Motor:moving all extremities antigravity without focal weakness Tone: is normal and bulk is normal Sensation-once to noxious stimuli as above Coordination: Unable to assess Gait- deferred   ASSESSMENT/PLAN  Acute Ischemic Infarct:  right MCA/PCA s/p TNK and mechanical thrombectomy with stent placement in right MCA Etiology: Embolic versus hypercoagulable state in the setting of acute COVID illness Code Stroke  CT head cute right MCA and PCA territory infarct ASPECTS 8   CTA head & neck  Occlusion of the right M1 segment and right PCA at the P1 P2 junction. 2. Severe narrowing in the supraclinoid ICA on the left and moderate to severe narrowing of the supraclinoid ICA on the right. 3. Moderate narrowing in the distal petrous segment of the left ICA. Post IR CT  patchy focal areas of hyperattenuation in the right parietal occipital region contrast staining versus subarachnoid hemorrhage/petechial hemorrhages.  Repeat CT head Persistent but regressed right occipital hyperdensity since last  night. Favor subarachnoid hemorrhage. Underlying right PCA territory cytotoxic edema or encephalomalacia. MRI Multifocal acute ischemia within the right MCA territory. Petechial hemorrhage in the  posterior right MCA territory, 2D Echo EF 55-60%. LV with moderate concentric hypertrophy. Grade II diastolic dysfunction  LDL 101 [id number] 9.0 VTE prophylaxis -heparin SQ aspirin 81 mg daily prior to admission, now on aspirin 81 mg daily and Brilinta 60 mg twice daily  Therapy recommendations: Pending Disposition: Pending   Hypertension Home meds: Norvasc 10 mg, Coreg 25 mg twice daily, losartan 25 mg daily, Dyazide  Stable Blood Pressure Goal: SBP less than 160  Norvasc added back   Hyperlipidemia Home meds: Atorvastatin 80 mg, resumed in hospital LDL 101, goal < 70 Continue statin at discharge  Diabetes type II UnControlled Home meds: Toujeo, Farxiga HgbA1c 9.0, goal <  7.0 CBGs SSI Recommend close follow-up with PCP for better DM control  Former tobacco Abuse Noted  Acute hypoxic respiratory failure with COVID-19 and flu ?  Aspiration pneumonia Managed per CCM  Propofol for sedation On ceftriaxone and azithromycin On remdesivir  Dysphagia Patient has post-stroke dysphagia, SLP consulted    Diet   Diet NPO time specified   Consider starting tube feeds   Other Active Problems CKD  Neurology will sign off. Please call with questions or concerns  Hospital day # 3  Gevena Mart DNP, ACNPC-AG  [facility]  STROKE MD NOTE :  I have personally obtained history,examined this patient, reviewed notes, independently viewed imaging studies, participated in medical decision making and plan of care.ROS completed by me personally and pertinent positives fully documented  I have made any additions or clarifications directly to the above note. Agree with note above.  Patient neurological exam shows improvement with patient arousable and able to follow commands and moves all 4 extremities except he does not blink to threat on the left.  Unfortunately his double worsening respiratory status requiring reintubation as well as worsening renal function likely from contrast-induced nephropathy.  Prognosis is guarded at this time.  Long discussion at bedside with patient's wife about his condition and answered questions.  Discussed with critical care team.  Continue aspirin and Brilinta for his intracranial stent.This patient is critically ill and at significant risk of neurological worsening, death and care requires constant monitoring of vital signs, hemodynamics,respiratory and cardiac monitoring, extensive review of multiple databases, frequent neurological assessment, discussion with family, other specialists and medical decision making of high complexity.I have made any additions or clarifications directly to the above note.This critical care time does  not reflect procedure time, or teaching time or supervisory time of PA/NP/Med Resident etc but could involve care discussion time.  I spent 30 minutes of neurocritical care time  in the care of  this patient.      Delia Heady, MD Medical Director [facility] Pager: [phone] 02/20/2023 4:37 PM    To contact Stroke Continuity provider, please refer to [email or web address]. After hours, contact General Neurology

## 2023-02-20 NOTE — Progress Notes (Signed)
   NAME:  MACEY WEARING, MRN:  [id number], DOB:  01-06-1941, LOS: 3 ADMISSION DATE:  02/17/2023  History of Present Illness:  82 year old male with hypertension, diabetes, CKD 3B, who presented after acute onset left-sided weakness, facial droop, and aphasia.  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  Tenecteplase administered.  He was intubated for airway protection.  He underwent revascularization and stenting of occluded right MCA and revascularization of  occluded right PCA without stenting.  Notably, he was diagnosed with COVID and flu on same day as strokelike symptoms occurred.  Significant Hospital Events:  8/9 admitted to MICU at [facility] after acute cerebral infarct s/p TNK and thrombectomy, intubated for airway protection, antibiotics started for aspiration, antivirals started for COVID and flu 8/11 extubated but re-intubated for respiratory failure  Interim History / Subjective:  Extubated and reintubated overnight for respiratory failure Agitation despite precedex Tmax 101.2 Prvc 20/650/10/40 875 uop  Objective   Blood pressure 118/62, pulse 61, temperature 97.9 F (36.6 C), temperature source Axillary, resp. rate 20, height 6\' 2"  (1.88 m), weight 86.3 kg, SpO2 100%.    Vent Mode: PRVC FiO2 (%):  [40 %-100 %] 40 % Set Rate:  [20 bmp] 20 bmp Vt Set:  [650 mL] 650 mL PEEP:  [5 cmH20-10 cmH20] 10 cmH20 Plateau Pressure:  [17 cmH20-24 cmH20] 17 cmH20   Intake/Output Summary (Last 24 hours) at 02/20/2023 0727 Last data filed at 02/20/2023 6578 Gross per 24 hour  Intake 3023.52 ml  Output 875 ml  Net 2148.52 ml   Filed Weights   02/18/23 0338 02/19/23 0451 02/20/23 0500  Weight: 84.4 kg 88.4 kg 86.3 kg    Examination: Uncomfortable appearing Heart rate normal, rhythm regular, radials strong Tachypnea on ventilator, overbreathing, lung sounds coarse Skin warm and dry Alert,  answers questions and follows simple instructions, thumbs up on right, pupils equal   Labs: Co2 17 Bun/cr 58/3.4 Bcx negative x3 days  Resolved Hospital Problem list   Resolved Problems:   * No resolved hospital problems. *   Assessment & Plan:  Principal Problem:   Acute ischemic stroke (HCC) Active Problems:   Acute respiratory failure with hypoxia (HCC)   Pneumonia due to COVID-19 virus   Aspiration pneumonia of both lungs due to gastric secretions (HCC)   Influenza  Right MCA and PCA infarcts s/p TNK and revascularization with stent in MCA M1 segment - Neurology following - Better neuro exam today - DAPT and atorvastatin - BP < 160  Aspiration pneumonia Acute hypoxic respiratory failure - failed trial of extubation yesterday - climbing procalcitonin - repeat resp cultures - broaden to pip/tazo - increase sedation for ventilator compliance for now  AKI on CKD IIIb, non-oliguric - uptrending creatinine - possible contrast nephropathy - avoid nephrotoxins - renal ultrasound  ?COVID ?Flu - screened positive as outpatient for malaise - dc antivirals, pneumonia is bacterial due to aspiration - infection precautions  DM2 - SSI with semglee  Goals of care - guarded prognosis - appreciate palliative assistance  Best practice (daily eval):  Diet/type: tubefeeds DVT prophylaxis: prophylactic heparin  GI prophylaxis: PPI Lines: Arterial Line Foley:  Yes, and it is still needed Code Status:  full code Last date of multidisciplinary goals of care discussion [02/20/23]  Marrianne Mood MD 02/20/2023, 7:27 AM  Pager: [phone]

## 2023-02-20 NOTE — Progress Notes (Signed)
OT Cancellation Note  Patient Details Name: KAMYAR CAMMISA MRN: [id number] DOB: 30-Mar-1941   Cancelled Treatment:    Reason Eval/Treat Not Completed: Patient not medically ready (Intubated last night. Will check back another day. Discussed with nsg.)  [facility] 02/20/2023, 7:45 AM Luisa Dago, OT/L   Acute OT Clinical Specialist Acute Rehabilitation Services Pager [phone] Office [phone]

## 2023-02-20 NOTE — Consult Note (Signed)
Palliative Medicine Inpatient Consult Note  Consulting Provider:  Lorin Glass, MD   Reason for consult:   Palliative Care Consult Services Palliative Medicine Consult  Reason for Consult? renal failure, pneumonia, stroke   02/20/2023  HPI:  Per intake H&P --> 82 year old male with hypertension, diabetes, CKD 3B, PAD, HFpEF. (+) right MCA, PNA, AKI. Palliative care asked to get involved for further goals of care conversations in the setting of severe illness.   Clinical Assessment/Goals of Care:  *Please note that this is a verbal dictation therefore any spelling or grammatical errors are due to the "Dragon Medical One" system interpretation.  I have reviewed medical records including EPIC notes, labs and imaging, received report from bedside RN, assessed the patient who is lying in bed intubated and sedated.    I met with patient's wife, Xavier [name] and daughter, Xavier [name] to further discuss diagnosis prognosis, GOC, EOL wishes, disposition and options.   I introduced Palliative Medicine as specialized medical care for people living with serious illness. It focuses on providing relief from the symptoms and stress of a serious illness. The goal is to improve quality of life for both the patient and the family.  Medical History Review and Understanding:  Xavier [name]'s past medical history was reviewed inclusive of his hypertension, diabetes, chronic kidney disease, heart failure, and peripheral arterial disease.  Social History:  Xavier [name] lives in [place].  He is married and has 2 children though 1 is deceased.  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  Per his family he enjoys traveling and recently-in July went on a safari in [place].  Xavier [name] is a man of faith and practices within Christianity.  Functional and Nutritional State:  Preceding Xavier [name]'s hospitalization he was fully functional of all B ADLs and IADLs.  He had a good  appetite.  Advance Directives:  A detailed discussion was had today regarding advanced directives.  Just of surrogate decision maker is his wife, Xavier [name]  Code Status:  Concepts specific to code status, artifical feeding and hydration, continued IV antibiotics and rehospitalization was had.  The difference between a aggressive medical intervention path  and a palliative comfort care path for this patient at this time was had.   At this time patient's family would like to pursue present measures inclusive of potential dialysis should it be needed  Discussion:  We reviewed Xavier [name]'s acute stroke.  Xavier [name]'s family share that he was improving wonderfully as of yesterday and they are quite surprised that his decline overnight to this morning.  They share awareness of his critical illness in the setting of his stroke, respiratory state, and kidney failure.  Patient's daughter asks if she can donate one of her kidneys to Xavier [name].  I shared that that is not something which would be considered and even dialysis can be complicated and is often determined is needed or not needed by the nephrology team.  Patient's family vocalizes the hope for improvement though I did share my involvement is to support them and that hope but also to provide support if things do not go well.  I did review if things were not to go well then we will be faced with the decision of comfort focused care.  At this time the goals of patient's family are to continue allowing time for outcomes.  Discussed the importance of continued conversation with family and their  medical providers regarding overall plan of care and treatment options, ensuring decisions are within  the context of the patients values and GOCs.  Decision Maker: Xavier [name],Xavier [name] (Spouse): [phone] (Mobile)   SUMMARY OF RECOMMENDATIONS   Full code/full scope of care  Open and honest conversations held with patient's family and they are aware of his critical  illnesses  Patient's family is hopeful for improvement and would like to allow time for outcomes  Ongoing palliative support  Code Status/Advance Care Planning: FULL CODE  Palliative Prophylaxis:  Aspiration, Bowel Regimen, Delirium Protocol, Frequent Pain Assessment, Oral Care, Palliative Wound Care, and Turn Reposition  Additional Recommendations (Limitations, Scope, Preferences): Continue current care  Psycho-social/Spiritual:  Desire for further Chaplaincy support: Yes Additional Recommendations: Education critical illness, respiratory failure, kidney failure   Prognosis: Worrisome overall  Discharge Planning: Discharge plan is uncertain at this time  Vitals:   02/20/23 0830 02/20/23 0845  BP:    Pulse: 71 68  Resp: 17 16  Temp:    SpO2: 95% 92%    Intake/Output Summary (Last 24 hours) at 02/20/2023 6962 Last data filed at 02/20/2023 0800 Gross per 24 hour  Intake 2933.95 ml  Output 800 ml  Net 2133.95 ml   Last Weight  Most recent update: 02/20/2023  5:21 AM    Weight  86.3 kg (190 lb 4.1 oz)            Gen: Elderly African-American male critically ill HEENT: ETT and OGT in place, dry mucous membranes CV: Regular rate and rhythm PULM: On mechanical ventilator ABD: soft/nontender EXT: (+) edema Neuro: Somnolent on fentanyl  PPS: 10%   This conversation/these recommendations were discussed with patient primary care team, Dr. Katrinka Blazing  Billing based on MDM: High  Problems Addressed: One acute or chronic illness or injury that poses a threat to life or bodily function  Amount and/or Complexity of Data: Category 3:Discussion of management or test interpretation with external physician/other qualified health care professional/appropriate source (not separately reported)  Risks: Parenteral controlled substances and Decision not to resuscitate or to de-escalate care because of poor prognosis ______________________________________________________ Xavier [name] [place] Palliative Medicine Team Team Cell Phone: [phone] Please utilize secure chat with additional questions, if there is no response within 30 minutes please call the above phone number  Palliative Medicine Team providers are available by phone from 7am to 7pm daily and can be reached through the team cell phone.  Should this patient require assistance outside of these hours, please call the patient's attending physician.

## 2023-02-21 ENCOUNTER — Inpatient Hospital Stay (HOSPITAL_COMMUNITY): Payer: PPO

## 2023-02-21 DIAGNOSIS — Z7189 Other specified counseling: Secondary | ICD-10-CM | POA: Diagnosis not present

## 2023-02-21 DIAGNOSIS — Z515 Encounter for palliative care: Secondary | ICD-10-CM | POA: Diagnosis not present

## 2023-02-21 DIAGNOSIS — I639 Cerebral infarction, unspecified: Secondary | ICD-10-CM | POA: Diagnosis not present

## 2023-02-21 HISTORY — PX: IR PATIENT EVAL TECH 0-60 MINS: IMG5564

## 2023-02-21 LAB — POCT I-STAT 7, (LYTES, BLD GAS, ICA,H+H)
Acid-base deficit: 8 mmol/L — ABNORMAL HIGH (ref 0.0–2.0)
Bicarbonate: 18.5 mmol/L — ABNORMAL LOW (ref 20.0–28.0)
Calcium, Ion: 1.12 mmol/L — ABNORMAL LOW (ref 1.15–1.40)
HCT: 24 % — ABNORMAL LOW (ref 39.0–52.0)
Hemoglobin: 8.2 g/dL — ABNORMAL LOW (ref 13.0–17.0)
O2 Saturation: 93 %
Patient temperature: 98.6
Potassium: 5 mmol/L (ref 3.5–5.1)
Sodium: 136 mmol/L (ref 135–145)
TCO2: 20 mmol/L — ABNORMAL LOW (ref 22–32)
pCO2 arterial: 41.9 mmHg (ref 32–48)
pH, Arterial: 7.252 — ABNORMAL LOW (ref 7.35–7.45)
pO2, Arterial: 77 mmHg — ABNORMAL LOW (ref 83–108)

## 2023-02-21 LAB — BASIC METABOLIC PANEL
Anion gap: 11 (ref 5–15)
Anion gap: 12 (ref 5–15)
Anion gap: 13 (ref 5–15)
Anion gap: 13 (ref 5–15)
Anion gap: 17 — ABNORMAL HIGH (ref 5–15)
BUN: 88 mg/dL — ABNORMAL HIGH (ref 8–23)
BUN: 88 mg/dL — ABNORMAL HIGH (ref 8–23)
BUN: 89 mg/dL — ABNORMAL HIGH (ref 8–23)
BUN: 91 mg/dL — ABNORMAL HIGH (ref 8–23)
BUN: 92 mg/dL — ABNORMAL HIGH (ref 8–23)
CO2: 17 mmol/L — ABNORMAL LOW (ref 22–32)
CO2: 18 mmol/L — ABNORMAL LOW (ref 22–32)
CO2: 18 mmol/L — ABNORMAL LOW (ref 22–32)
CO2: 19 mmol/L — ABNORMAL LOW (ref 22–32)
CO2: 20 mmol/L — ABNORMAL LOW (ref 22–32)
Calcium: 7.8 mg/dL — ABNORMAL LOW (ref 8.9–10.3)
Calcium: 8 mg/dL — ABNORMAL LOW (ref 8.9–10.3)
Calcium: 8 mg/dL — ABNORMAL LOW (ref 8.9–10.3)
Calcium: 8.8 mg/dL — ABNORMAL LOW (ref 8.9–10.3)
Calcium: 9 mg/dL (ref 8.9–10.3)
Chloride: 104 mmol/L (ref 98–111)
Chloride: 105 mmol/L (ref 98–111)
Chloride: 105 mmol/L (ref 98–111)
Chloride: 105 mmol/L (ref 98–111)
Chloride: 107 mmol/L (ref 98–111)
Creatinine, Ser: 3.58 mg/dL — ABNORMAL HIGH (ref 0.61–1.24)
Creatinine, Ser: 3.81 mg/dL — ABNORMAL HIGH (ref 0.61–1.24)
Creatinine, Ser: 3.86 mg/dL — ABNORMAL HIGH (ref 0.61–1.24)
Creatinine, Ser: 3.87 mg/dL — ABNORMAL HIGH (ref 0.61–1.24)
Creatinine, Ser: 3.88 mg/dL — ABNORMAL HIGH (ref 0.61–1.24)
GFR, Estimated: 15 mL/min — ABNORMAL LOW (ref >60)
GFR, Estimated: 15 mL/min — ABNORMAL LOW (ref >60)
GFR, Estimated: 15 mL/min — ABNORMAL LOW (ref >60)
GFR, Estimated: 15 mL/min — ABNORMAL LOW (ref >60)
GFR, Estimated: 16 mL/min — ABNORMAL LOW (ref >60)
Glucose, Bld: 144 mg/dL — ABNORMAL HIGH (ref 70–99)
Glucose, Bld: 334 mg/dL — ABNORMAL HIGH (ref 70–99)
Glucose, Bld: 426 mg/dL — ABNORMAL HIGH (ref 70–99)
Glucose, Bld: 434 mg/dL — ABNORMAL HIGH (ref 70–99)
Glucose, Bld: 94 mg/dL (ref 70–99)
Potassium: 4 mmol/L (ref 3.5–5.1)
Potassium: 4.8 mmol/L (ref 3.5–5.1)
Potassium: 5.2 mmol/L — ABNORMAL HIGH (ref 3.5–5.1)
Potassium: 5.2 mmol/L — ABNORMAL HIGH (ref 3.5–5.1)
Potassium: 5.2 mmol/L — ABNORMAL HIGH (ref 3.5–5.1)
Sodium: 134 mmol/L — ABNORMAL LOW (ref 135–145)
Sodium: 134 mmol/L — ABNORMAL LOW (ref 135–145)
Sodium: 135 mmol/L (ref 135–145)
Sodium: 140 mmol/L (ref 135–145)
Sodium: 141 mmol/L (ref 135–145)

## 2023-02-21 LAB — GLUCOSE, CAPILLARY
Glucose-Capillary: 103 mg/dL — ABNORMAL HIGH (ref 70–99)
Glucose-Capillary: 107 mg/dL — ABNORMAL HIGH (ref 70–99)
Glucose-Capillary: 114 mg/dL — ABNORMAL HIGH (ref 70–99)
Glucose-Capillary: 116 mg/dL — ABNORMAL HIGH (ref 70–99)
Glucose-Capillary: 130 mg/dL — ABNORMAL HIGH (ref 70–99)
Glucose-Capillary: 149 mg/dL — ABNORMAL HIGH (ref 70–99)
Glucose-Capillary: 222 mg/dL — ABNORMAL HIGH (ref 70–99)
Glucose-Capillary: 249 mg/dL — ABNORMAL HIGH (ref 70–99)
Glucose-Capillary: 289 mg/dL — ABNORMAL HIGH (ref 70–99)
Glucose-Capillary: 299 mg/dL — ABNORMAL HIGH (ref 70–99)
Glucose-Capillary: 328 mg/dL — ABNORMAL HIGH (ref 70–99)
Glucose-Capillary: 331 mg/dL — ABNORMAL HIGH (ref 70–99)
Glucose-Capillary: 335 mg/dL — ABNORMAL HIGH (ref 70–99)
Glucose-Capillary: 381 mg/dL — ABNORMAL HIGH (ref 70–99)
Glucose-Capillary: 65 mg/dL — ABNORMAL LOW (ref 70–99)
Glucose-Capillary: 93 mg/dL (ref 70–99)

## 2023-02-21 LAB — BETA-HYDROXYBUTYRIC ACID
Beta-Hydroxybutyric Acid: 0.13 mmol/L (ref 0.05–0.27)
Beta-Hydroxybutyric Acid: 0.13 mmol/L (ref 0.05–0.27)

## 2023-02-21 LAB — TRIGLYCERIDES: Triglycerides: 176 mg/dL — ABNORMAL HIGH (ref <150)

## 2023-02-21 LAB — LEGIONELLA PNEUMOPHILA SEROGP 1 UR AG: L. pneumophila Serogp 1 Ur Ag: NEGATIVE

## 2023-02-21 MED ORDER — POTASSIUM CHLORIDE 10 MEQ/100ML IV SOLN
10.0000 meq | INTRAVENOUS | Status: AC
  Administered 2023-02-21 (×2): 10 meq via INTRAVENOUS
  Filled 2023-02-21: qty 100

## 2023-02-21 MED ORDER — DEXTROSE IN LACTATED RINGERS 5 % IV SOLN: INTRAVENOUS | Status: DC

## 2023-02-21 MED ORDER — DEXTROSE 50 % IV SOLN: 0.0000 mL | INTRAVENOUS | Status: DC | PRN

## 2023-02-21 MED ORDER — CALCIUM GLUCONATE-NACL 2-0.675 GM/100ML-% IV SOLN
2.0000 g | Freq: Once | INTRAVENOUS | Status: AC
  Administered 2023-02-21: 2000 mg via INTRAVENOUS
  Filled 2023-02-21: qty 100

## 2023-02-21 MED ORDER — MAGNESIUM SULFATE 2 GM/50ML IV SOLN
2.0000 g | Freq: Once | INTRAVENOUS | Status: AC
  Administered 2023-02-21: 2 g via INTRAVENOUS
  Filled 2023-02-21: qty 50

## 2023-02-21 MED ORDER — INSULIN REGULAR(HUMAN) IN NACL 100-0.9 UT/100ML-% IV SOLN
INTRAVENOUS | Status: DC
  Administered 2023-02-21: 6 [IU]/h via INTRAVENOUS
  Filled 2023-02-21: qty 100

## 2023-02-21 MED ORDER — ALBUMIN HUMAN 25 % IV SOLN
25.0000 g | Freq: Four times a day (QID) | INTRAVENOUS | Status: AC
  Administered 2023-02-21 – 2023-02-22 (×4): 25 g via INTRAVENOUS
  Filled 2023-02-21 (×4): qty 100

## 2023-02-21 MED ORDER — FUROSEMIDE 10 MG/ML IJ SOLN
80.0000 mg | Freq: Three times a day (TID) | INTRAMUSCULAR | Status: DC
  Administered 2023-02-21 – 2023-02-22 (×4): 80 mg via INTRAVENOUS
  Filled 2023-02-21 (×4): qty 8

## 2023-02-21 MED ORDER — INSULIN ASPART 100 UNIT/ML IJ SOLN
0.0000 [IU] | INTRAMUSCULAR | Status: DC
  Administered 2023-02-22: 3 [IU] via SUBCUTANEOUS
  Administered 2023-02-22 (×2): 4 [IU] via SUBCUTANEOUS
  Administered 2023-02-23 (×2): 7 [IU] via SUBCUTANEOUS
  Administered 2023-02-23: 11 [IU] via SUBCUTANEOUS
  Administered 2023-02-23 (×2): 7 [IU] via SUBCUTANEOUS
  Administered 2023-02-23 (×2): 4 [IU] via SUBCUTANEOUS
  Administered 2023-02-24 (×2): 7 [IU] via SUBCUTANEOUS
  Administered 2023-02-24: 11 [IU] via SUBCUTANEOUS
  Administered 2023-02-24: 3 [IU] via SUBCUTANEOUS
  Administered 2023-02-24: 4 [IU] via SUBCUTANEOUS
  Administered 2023-02-25: 7 [IU] via SUBCUTANEOUS
  Administered 2023-02-25: 4 [IU] via SUBCUTANEOUS
  Administered 2023-02-25: 15 [IU] via SUBCUTANEOUS
  Administered 2023-02-25: 11 [IU] via SUBCUTANEOUS
  Administered 2023-02-25 – 2023-02-26 (×2): 7 [IU] via SUBCUTANEOUS
  Administered 2023-02-26: 4 [IU] via SUBCUTANEOUS
  Administered 2023-02-26: 7 [IU] via SUBCUTANEOUS
  Administered 2023-02-26: 3 [IU] via SUBCUTANEOUS
  Administered 2023-02-27: 4 [IU] via SUBCUTANEOUS
  Administered 2023-02-27: 11 [IU] via SUBCUTANEOUS
  Administered 2023-02-27: 4 [IU] via SUBCUTANEOUS
  Administered 2023-02-27: 11 [IU] via SUBCUTANEOUS
  Administered 2023-02-27 (×2): 4 [IU] via SUBCUTANEOUS
  Administered 2023-02-28: 3 [IU] via SUBCUTANEOUS
  Administered 2023-02-28 (×3): 7 [IU] via SUBCUTANEOUS
  Administered 2023-02-28 (×2): 3 [IU] via SUBCUTANEOUS
  Administered 2023-03-01: 4 [IU] via SUBCUTANEOUS
  Administered 2023-03-01: 11 [IU] via SUBCUTANEOUS
  Administered 2023-03-01: 4 [IU] via SUBCUTANEOUS
  Administered 2023-03-01: 7 [IU] via SUBCUTANEOUS
  Administered 2023-03-02 (×2): 3 [IU] via SUBCUTANEOUS
  Administered 2023-03-02: 4 [IU] via SUBCUTANEOUS
  Administered 2023-03-02: 3 [IU] via SUBCUTANEOUS

## 2023-02-21 MED ORDER — DEXTROSE 10 % IV SOLN
INTRAVENOUS | Status: AC
  Administered 2023-02-21: 30 mL/h via INTRAVENOUS

## 2023-02-21 MED ORDER — POTASSIUM CHLORIDE 20 MEQ PO PACK
40.0000 meq | PACK | Freq: Once | ORAL | Status: AC
  Administered 2023-02-21: 40 meq
  Filled 2023-02-21: qty 2

## 2023-02-21 MED ORDER — DEXTROSE 50 % IV SOLN
12.5000 g | INTRAVENOUS | Status: AC
  Administered 2023-02-21: 12.5 g via INTRAVENOUS
  Filled 2023-02-21: qty 50

## 2023-02-21 MED ORDER — ORAL CARE MOUTH RINSE: 15.0000 mL | OROMUCOSAL | Status: DC | PRN

## 2023-02-21 MED ORDER — POLYETHYLENE GLYCOL 3350 17 G PO PACK: 17.0000 g | PACK | Freq: Every day | ORAL | Status: DC | PRN

## 2023-02-21 MED ORDER — DEXTROSE 10 % IV SOLN: INTRAVENOUS | Status: DC

## 2023-02-21 MED ORDER — HEPARIN (PORCINE) 25000 UT/250ML-% IV SOLN
900.0000 [IU]/h | INTRAVENOUS | Status: DC
  Administered 2023-02-21: 950 [IU]/h via INTRAVENOUS
  Administered 2023-02-22: 800 [IU]/h via INTRAVENOUS
  Filled 2023-02-21 (×2): qty 250

## 2023-02-21 MED ORDER — GLUCERNA 1.5 CAL PO LIQD
1000.0000 mL | ORAL | Status: DC
  Administered 2023-02-21 – 2023-03-01 (×5): 1000 mL
  Filled 2023-02-21 (×15): qty 1000

## 2023-02-21 MED ORDER — INSULIN GLARGINE-YFGN 100 UNIT/ML ~~LOC~~ SOLN
45.0000 [IU] | Freq: Every day | SUBCUTANEOUS | Status: DC
  Administered 2023-02-21: 45 [IU] via SUBCUTANEOUS
  Filled 2023-02-21 (×2): qty 0.45

## 2023-02-21 MED ORDER — ORAL CARE MOUTH RINSE
15.0000 mL | OROMUCOSAL | Status: DC
  Administered 2023-02-21 – 2023-02-23 (×10): 15 mL via OROMUCOSAL

## 2023-02-21 NOTE — Progress Notes (Signed)
ANTICOAGULATION CONSULT NOTE - Initial Consult  Pharmacy Consult for Heparin Indication: atrial fibrillation  Allergies  Allergen Reactions   Benazepril Other (See Comments)   Ezetimibe Other (See Comments)   Losartan Potassium Other (See Comments)    Patient Measurements: Height: 6\' 2"  (188 cm) Weight: 87.2 kg (192 lb 3.9 oz) IBW/kg (Calculated) : 82.2 Heparin Dosing Weight: 87 kg  Vital Signs: Temp: 100 F (37.8 C) (08/13 1514) Temp Source: Axillary (08/13 1514) BP: 134/74 (08/13 1715) Pulse Rate: 71 (08/13 1300)  Labs: Recent Labs    02/19/23 0301 02/19/23 1802 02/20/23 0029 02/20/23 0032 02/21/23 0013 02/21/23 0024 02/21/23 0045 02/21/23 0414 02/21/23 1001  HGB 9.8*   < > 8.8* 9.5*  --   --  8.2*  --   --   HCT 30.0*   < > 26.0* 28.7*  --   --  24.0*  --   --   PLT 144*  --   --  151  --   --   --   --   --   CREATININE 3.03*  --   --  3.40*   < > 3.88*  --  3.81* 3.86*   < > = values in this interval not displayed.    Estimated Creatinine Clearance: 17.2 mL/min (A) (by C-G formula based on SCr of 3.86 mg/dL (H)).   Medical History: Past Medical History:  Diagnosis Date   Congenital ureterocele    Diabetes mellitus without complication (HCC)    Hypertension    Prostate cancer (HCC)     Medications:  Facility-Administered Medications Prior to Admission  Medication Dose Route Frequency Provider Last Rate Last Admin   [DISCONTINUED] 0.9 %  sodium chloride infusion  500 mL Intravenous Continuous Danis, Starr Lake III, MD       Medications Prior to Admission  Medication Sig Dispense Refill Last Dose   amLODipine (NORVASC) 10 MG tablet TAKE 1 TABLET BY MOUTH EVERY DAY 90 tablet 1 Past Week   aspirin EC 81 MG tablet Take 1 tablet (81 mg total) by mouth daily. With food 30 tablet 5 Past Week   atorvastatin (LIPITOR) 80 MG tablet Take 1 tablet (80 mg total) by mouth daily. In evening 30 tablet 5 Past Week   carvedilol (COREG) 25 MG tablet Take 25 mg by mouth  2 (two) times daily with a meal.   Past Week   Continuous Blood Gluc Sensor (FREESTYLE LIBRE 2 SENSOR) MISC Change every 14 days to monitor blood glucose continually  DX: E11.59      FARXIGA 10 MG TABS tablet Take 10 mg by mouth daily.   Past Week   glucose blood (ONETOUCH VERIO) test strip USE TO TEST BLOOD SUGAR 3 TIMES DAILY OR AS DIRECTED. E11.65 (HMO PLAN)      Insulin Pen Needle (B-D ULTRAFINE III SHORT PEN) 31G X 8 MM MISC use 3 needle as directed with each meal- DX E11.59      losartan (COZAAR) 25 MG tablet Take 25 mg by mouth daily.   Past Week   OneTouch Delica Lancets 33G MISC Use to test sugar 3 times daily or as instructed. E11.65      TOUJEO SOLOSTAR 300 UNIT/ML Solostar Pen Inject 60 Units into the skin daily.   Past Week   triamterene-hydrochlorothiazide (DYAZIDE) 37.5-25 MG capsule Take 1 capsule by mouth daily.   Past Week   PRALUENT 150 MG/ML SOAJ DOSE EVEY 2 WEEKS AS DIRECTED. (Patient not taking: Reported on 01/19/2023)  Assessment: 82 y.o. M presented with stroke - received TNK 8/9 1650. 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 Pt on SQ heparin 5000 units TID - last dose 1430.  Goal of Therapy:  Heparin level 0.3-0.5 units/ml Monitor platelets by anticoagulation protocol: Yes   Plan:  Heparin gtt at 950 units/hr. No bolus. Will f/u heparin level in 8 hours Daily heparin level and CBC   Christoper Fabian, PharmD, BCPS Please see amion for complete clinical pharmacist phone list 02/21/2023,5:42 PM

## 2023-02-21 NOTE — Progress Notes (Signed)
   Palliative Medicine Inpatient Follow Up Note HPI: 82 year old male with hypertension, diabetes, CKD 3B, PAD, HFpEF. (+) right MCA, PNA, AKI. Palliative care asked to get involved for further goals of care conversations in the setting of severe illness.   Today's Discussion 02/21/2023  *Please note that this is a verbal dictation therefore any spelling or grammatical errors are due to the "Dragon Medical One" system interpretation.  Chart reviewed inclusive of vital signs, progress notes, laboratory results, and diagnostic images.   I met at bedside with Jomarie Longs this morning. He was more somnolent on fentanyl though able to respond. Per conversations with patients medical team plan for extubation this morning.  I met with patients daughter, Marcelino Duster who then went to bedside to get an update from Dr. Katrinka Blazing and Benito Mccreedy who shared the plan for extubation. They reviewed the lack of kidney improvement as well.  Honest conversations were held in that if Jahzier should not tolerate extubation he may end up re-intubated or possibly  require a tracheostomy.   I remained at bedside during morning extubation whereby patient was slightly more alert and able to vocalize his name thereafter.   Patients family faced with decisions moving forward related to dialysis.   Created space and opportunity for patients daughter to explore thoughts feelings and fears regarding Eaven's current medical situation.  Education offered. Questions and concerns addressed. Palliative Support Provided.   Objective Assessment: Vital Signs Vitals:   02/21/23 1230 02/21/23 1245  BP: (!) 151/59   Pulse: 77 77  Resp: (!) 26 18  Temp:    SpO2: 92% 91%    Intake/Output Summary (Last 24 hours) at 02/21/2023 1319 Last data filed at 02/21/2023 1200 Gross per 24 hour  Intake 2841.97 ml  Output 498 ml  Net 2343.97 ml   Last Weight  Most recent update: 02/21/2023  3:45 AM    Weight  87.2 kg (192 lb 3.9 oz)             Gen: Elderly African-American male critically ill HEENT: NGT in place, dry mucous membranes CV: Regular rate and rhythm PULM: On 25LPM HFNC, breathing is nonlabored ABD: soft/nontender EXT: (+) edema Neuro: Somnolent on fentanyl  SUMMARY OF RECOMMENDATIONS   Full code/full scope of care   Discussed consideration if patient would desire reintubation  Family agreeable to short term dialysis   Patient's family is hopeful for improvement and would like to allow time for outcomes   Ongoing palliative support  Time Spent: 41 Billing based on MDM: High  ______________________________________________________________________________________ Lamarr Lulas [place] Palliative Medicine Team Team Cell Phone: [phone] Please utilize secure chat with additional questions, if there is no response within 30 minutes please call the above phone number  Palliative Medicine Team providers are available by phone from 7am to 7pm daily and can be reached through the team cell phone.  Should this patient require assistance outside of these hours, please call the patient's attending physician.

## 2023-02-21 NOTE — Progress Notes (Signed)
NAME:  Xavier [name], MRN:  [id number], DOB:  [birth year], LOS: 4 ADMISSION DATE:  02/17/2023  History of Present Illness:  82 year old male with hypertension, diabetes, CKD 3B, who presented after acute onset left-sided weakness, facial droop, and aphasia.  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  Tenecteplase administered.  He was intubated for airway protection.  He underwent revascularization and stenting of occluded right MCA and revascularization of  occluded right PCA without stenting.  Notably, he was diagnosed with COVID and flu on same day as strokelike symptoms occurred.  Significant Hospital Events:  8/9 admitted to MICU at [facility] after acute cerebral infarct s/p TNK and thrombectomy, intubated for airway protection, antibiotics started for aspiration, antivirals started for COVID and flu 8/11 extubated but re-intubated for respiratory failure  Interim History / Subjective:  COVID and flu negative again.  Watery stool x 3, Flexi-Seal ordered. Insulin drip started for hyperglycemia. Hypotensive overnight, afebrile. Sedated with fentanyl and propofol. PRVC 20/650/8/40% 660 mL urine output  Objective   Blood pressure (!) 77/33, pulse (!) 55, temperature 98.6 F (37 C), temperature source Oral, resp. rate 20, height 6\' 2"  (1.88 m), weight 87.2 kg, SpO2 100%.    Vent Mode: PRVC FiO2 (%):  [40 %] 40 % Set Rate:  [20 bmp] 20 bmp Vt Set:  [650 mL] 650 mL PEEP:  [8 cmH20-10 cmH20] 8 cmH20 Pressure Support:  [5 cmH20] 5 cmH20 Plateau Pressure:  [15 cmH20-23 cmH20] 22 cmH20   Intake/Output Summary (Last 24 hours) at 02/21/2023 0720 Last data filed at 02/21/2023 0600 Gross per 24 hour  Intake 3274.88 ml  Output 698 ml  Net 2576.88 ml   Filed Weights   02/19/23 0451 02/20/23 0500 02/21/23 0344  Weight: 88.4 kg 86.3 kg 87.2 kg    Examination: Intubated Heart rate and rhythm normal,  radial pulses strong, right upper extremity edema ETT connected to vent, breathing on pressure support, lungs with coarse crackles bilaterally Skin is warm and dry Alert to verbal, follows simple instructions, cough during inline ETT suctioning  Labs: ABG 7.252/41.9/77 Sodium 134 Potassium 5.2 Bicarb 18 BUN/creatinine 88/3.81 Triglycerides 176 Hemoglobin 8.2 Beta-hydroxybutyrate 0.13  Renal ultrasound with mild right caliectasis, no left hydronephrosis  Resolved Hospital Problem list   Resolved Problems:   * No resolved hospital problems. *   Assessment & Plan:  Principal Problem:   Acute ischemic stroke (HCC) Active Problems:   Acute respiratory failure with hypoxia (HCC)   Pneumonia due to COVID-19 virus   Aspiration pneumonia of both lungs due to gastric secretions (HCC)   Influenza  Right MCA and PCA infarcts s/p TNK and revascularization with stent in MCA M1 segment - Neurology signed off, appreciate their assistance - Better neuro exam today - DAPT and atorvastatin - BP < 160  Aspiration pneumonia versus pneumonitis Acute hypoxic respiratory failure - Continue Zosyn for now - Extubate to heated high flow [place] - SpO2 >92% - COVID and flu negative on repeat test  AKI on CKD IIIb, borderline nonoliguric 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 - Lasix and albumin challenge - Trend UOP and BMP - Appreciate nephrology assistance - If renal failure, anticipate time-limited trial of renal replacement therapy  Labile blood pressure - Hypertension outside of the hospital - DC the art line and left femoral artery sheath as I suspect that these numbers were inaccurate - Treat to cuff pressures - Restart home  amlodipine - DC Cleviprex  DM2 -DC insulin infusion -Start glargine 45 units daily -Resistant sliding scale  Goals of care - guarded prognosis - appreciate palliative assistance  Best practice (daily eval):  Diet/type: tubefeeds, DC NG tube pending swallow  eval DVT prophylaxis: prophylactic heparin  GI prophylaxis: N/A Lines: N/A Foley:  Yes, and it is still needed Code Status:  full code Last date of multidisciplinary goals of care discussion [02/20/23]  Marrianne Mood MD 02/21/2023, 7:20 AM  Pager: [phone]

## 2023-02-21 NOTE — Progress Notes (Signed)
PT Cancellation Note  Patient Details Name: Xavier [name] MRN: [id number] DOB: Dec 09, 1940   Cancelled Treatment:    Reason Eval/Treat Not Completed: Medical issues which prohibited therapy. Pt with sheath removal and on 4 hours flat bedrest. Will check again tomorrow.    Angelina Ok [facility] 02/21/2023, 1:51 PM Skip Mayer PT Acute [employer] [phone]

## 2023-02-21 NOTE — Consult Note (Signed)
Reason for Consult: Renal failure Referring Physician:  Dr. Myrla Halsted  Chief Complaint: Shortness of breath, left sided weakness  Assessment/Plan: Acute of CKD3b/IV with baseline creatinine of 1.7 09/28/2022 and an episode of AKI many years ago. Currently the acute component of renal failure is likely from contrast induced nephropathy. U/S showed mild caliectasis on the right side but no e/o hydronephrosis.  - Currently with progressively worsening renal function but no absolute indication to initiate renal replacement.  - D/W spouse who was bedside and patient did not want to be on dialysis but likely was referring to chronic standard three times per week outpt dialysis treatments. She stated that they do want dialysis in the short term period if needed to hopefully allow his renal function to recover.  -Maintain MAP>65 for optimal renal perfusion.  - Avoid nephrotoxic medications including NSAIDs and iodinated intravenous contrast exposure unless the latter is absolutely indicated.   - Preferred narcotic agents for pain control are hydromorphone, fentanyl, and methadone. Morphine should not be used.  - Avoid Baclofen and avoid oral sodium phosphate and magnesium citrate based laxatives / bowel preps.  - Continue strict Input and Output monitoring. 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 Spouse bedside as well. Aspiration PNA with hypotension requiring Levophed but currently hypertensive requiring Cleviprex after extubation on broad spectrum abx. Hypertension - back on Cleviprex after extubation; for the 1st 24hours would keep SBP ~120-160 and then can targe 110-150 following. His BP has been very labile and Cleviprex may even be titrated down / off if he is given sedation; appears very  agitated. DM2 per primary team.    HPI: Xavier [name] is an 82 y.o. male DM2, prostate cancer, HTN presenting after a syncopal episode noted with foaming at the mouth, drowsiness and hypersomnia progressing over the past with subsequent diagnosis of COVID and influenza at his PMD; repeat x2 @[facility]  have been negative. After the clinic visit he became diaphoretic  and noted to have a left facial droop + LOC. In the ED he was noted to have left sided weakness and aphasia w/ desaturations to the 70's as well. CTA on 8/9 w/ 75ml of Iohexol showed a right M1 occlusion leading to TNKase, Iohexol as well later that evening on 8/9 with mechanical thrombectomy of right M1 and right P2 with stenting and PTA required. Patient has also had hypotensive episodes requiring Levophed on 8/11-8/12 but actually became very hypertensive after extubation with systolic ~220's. Of note the pt was  intubated at admission and then reintubated on 8/11 because of respiratory distress and sats dropping into the 80's with increased thick secretions. Patient extubated again on 8/13.  ROS Pertinent items are noted in HPI.  Chemistry and CBC: Creatinine, Ser  Date/Time Value Ref Range Status  02/21/2023 04:14 AM 3.81 (H) 0.61 - 1.24 mg/dL Final  14/78/2956 21:30 AM 3.88 (H) 0.61 - 1.24 mg/dL Final  86/57/8469 62:95 AM 3.87 (H) 0.61 - 1.24 mg/dL Final  28/41/3244 01:02 AM 3.40 (H) 0.61 - 1.24 mg/dL Final  72/53/6644 03:47 AM 3.03 (H) 0.61 - 1.24 mg/dL Corrected    Comment:    DELTA CHECK NOTED CORRECTED ON 08/11 AT 0331: PREVIOUSLY REPORTED AS 3.03 DIALYSIS   02/18/2023 03:44 AM 1.98 (H) 0.61 - 1.24 mg/dL Final  02/18/2023 01:41 AM 1.94 (H) 0.61 - 1.24 mg/dL Final  53/66/4403 47:42 PM 2.00 (H) 0.61 - 1.24 mg/dL Final  59/56/3875 64:33 PM 2.10 (H) 0.61 - 1.24 mg/dL Final  29/51/8841 66:06 AM 1.70 (H) 0.61 - 1.24 mg/dL Final  30/16/0109 32:35 AM 1.76 (H) 0.61 - 1.24 mg/dL Final  57/32/2025 42:70 PM 2.00 (H) 0.61 -  1.24 mg/dL Final  62/37/6283 15:17 PM 1.67 (H) 0.61 - 1.24 mg/dL Final  61/60/7371 06:26 PM 1.79 (H) 0.50 - 1.35 mg/dL Final  94/85/4627 03:50 PM 1.27 0.50 - 1.35 mg/dL Final  09/38/1829 93:71 PM 1.34 0.50 - 1.35 mg/dL Final   Recent Labs  Lab 02/18/23 0141 02/18/23 0344 02/19/23 0301 02/19/23 1802 02/20/23 0029 02/20/23 0032 02/21/23 0013 02/21/23 0024 02/21/23 0045 02/21/23 0414  NA 134* 133* 135 135 139 139 135 134* 136 134*  K 4.0 4.4 4.4 3.9 3.7 3.8 5.2* 5.2* 5.0 5.2*  CL 105 105 106  --   --  105 105 104  --  105  CO2 19* 18* 20*  --   --  17* 18* 17*  --  18*  GLUCOSE 251* 244* 195*  --   --  73 434* 426*  --  334*  BUN 26* 28* 47*  --   --  58* 89* 88*  --  88*  CREATININE 1.94* 1.98* 3.03*  --   --  3.40* 3.87* 3.88*  --  3.81*  CALCIUM 7.7* 7.8* 7.8*  --   --  8.1* 8.0* 8.0*  --  7.8*  PHOS  --  2.3* 3.4  --   --   --   --   --   --   --    Recent Labs  Lab 02/11/2023 1550 02/15/2023 1650 02/18/23 0344 02/19/23 0301 02/19/23 1802 02/20/23 0029 02/20/23 0032 02/21/23 0045  WBC 8.6  --  3.6* 7.8  --   --  10.4  --   NEUTROABS 5.8  --   --   --   --   --   --   --   HGB 12.9*   < > 11.4* 9.8* 9.2* 8.8* 9.5* 8.2*  HCT 40.3   < > 35.8* 30.0* 27.0* 26.0* 28.7* 24.0*  MCV 83.6  --  82.1 82.4  --   --  81.3  --   PLT 220  --  201 144*  --   --  151  --    < > = values in this interval not displayed.   Liver Function Tests: Recent Labs  Lab 03/10/2023 1550 02/18/23 0344  AST 21 15  ALT 16 12  ALKPHOS 91 64  BILITOT 1.1 0.7  PROT 7.1 5.2*  ALBUMIN 3.5 2.4*   No results for input(s): "LIPASE", "AMYLASE" in the last 168 hours. No results for input(s): "AMMONIA" in the last 168 hours. Cardiac Enzymes: No results for input(s): "CKTOTAL", "CKMB", "CKMBINDEX", "TROPONINI" in the last 168 hours. Iron Studies: No results for input(s): "IRON", "TIBC", "TRANSFERRIN", "FERRITIN" in the last 72 hours. PT/INR: @LABRCNTIP (inr:5)  Xrays/Other Studies: ) Results for  orders placed or performed during the hospital encounter of 03/10/2023 (from the past 48 hour(s))  Glucose, capillary     Status: Abnormal   Collection Time: 02/19/23 11:22 AM  Result Value Ref Range   Glucose-Capillary 264 (H) 70 - 99 mg/dL    Comment: Glucose reference range applies only to samples taken after fasting for at least 8 hours.  Glucose, capillary  Status: Abnormal   Collection Time: 02/19/23  4:00 PM  Result Value Ref Range   Glucose-Capillary 183 (H) 70 - 99 mg/dL    Comment: Glucose reference range applies only to samples taken after fasting for at least 8 hours.  POCT Activated clotting time     Status: None   Collection Time: 02/19/23  5:56 PM  Result Value Ref Range   Activated Clotting Time 0 seconds    Comment: Reference range 74-137 seconds for patients not on anticoagulant therapy.  I-STAT 7, (LYTES, BLD GAS, ICA, H+H)     Status: Abnormal   Collection Time: 02/19/23  6:02 PM  Result Value Ref Range   pH, Arterial 7.416 7.35 - 7.45   pCO2 arterial 28.7 (L) 32 - 48 mmHg   pO2, Arterial 108 83 - 108 mmHg   Bicarbonate 18.2 (L) 20.0 - 28.0 mmol/L   TCO2 19 (L) 22 - 32 mmol/L   O2 Saturation 98 %   Acid-base deficit 5.0 (H) 0.0 - 2.0 mmol/L   Sodium 135 135 - 145 mmol/L   Potassium 3.9 3.5 - 5.1 mmol/L   Calcium, Ion 1.09 (L) 1.15 - 1.40 mmol/L   HCT 27.0 (L) 39.0 - 52.0 %   Hemoglobin 9.2 (L) 13.0 - 17.0 g/dL   Patient temperature 409.8 F    Sample type ARTERIAL   Glucose, capillary     Status: Abnormal   Collection Time: 02/19/23  7:53 PM  Result Value Ref Range   Glucose-Capillary 123 (H) 70 - 99 mg/dL    Comment: Glucose reference range applies only to samples taken after fasting for at least 8 hours.  Glucose, capillary     Status: None   Collection Time: 02/19/23 10:57 PM  Result Value Ref Range   Glucose-Capillary 73 70 - 99 mg/dL    Comment: Glucose reference range applies only to samples taken after fasting for at least 8 hours.  I-STAT 7,  (LYTES, BLD GAS, ICA, H+H)     Status: Abnormal   Collection Time: 02/20/23 12:29 AM  Result Value Ref Range   pH, Arterial 7.416 7.35 - 7.45   pCO2 arterial 27.9 (L) 32 - 48 mmHg   pO2, Arterial 274 (H) 83 - 108 mmHg   Bicarbonate 17.9 (L) 20.0 - 28.0 mmol/L   TCO2 19 (L) 22 - 32 mmol/L   O2 Saturation 100 %   Acid-base deficit 6.0 (H) 0.0 - 2.0 mmol/L   Sodium 139 135 - 145 mmol/L   Potassium 3.7 3.5 - 5.1 mmol/L   Calcium, Ion 1.08 (L) 1.15 - 1.40 mmol/L   HCT 26.0 (L) 39.0 - 52.0 %   Hemoglobin 8.8 (L) 13.0 - 17.0 g/dL   Patient temperature 11.9 F    Collection site RADIAL, ALLEN'S TEST ACCEPTABLE    Drawn by VP    Sample type ARTERIAL   Glucose, capillary     Status: Abnormal   Collection Time: 02/20/23 12:30 AM  Result Value Ref Range   Glucose-Capillary 66 (L) 70 - 99 mg/dL    Comment: Glucose reference range applies only to samples taken after fasting for at least 8 hours.  Basic metabolic panel     Status: Abnormal   Collection Time: 02/20/23 12:32 AM  Result Value Ref Range   Sodium 139 135 - 145 mmol/L   Potassium 3.8 3.5 - 5.1 mmol/L   Chloride 105 98 - 111 mmol/L   CO2 17 (L) 22 - 32 mmol/L   Glucose, Bld  73 70 - 99 mg/dL    Comment: Glucose reference range applies only to samples taken after fasting for at least 8 hours.   BUN 58 (H) 8 - 23 mg/dL   Creatinine, Ser 1.61 (H) 0.61 - 1.24 mg/dL   Calcium 8.1 (L) 8.9 - 10.3 mg/dL   GFR, Estimated 17 (L) >60 mL/min    Comment: (NOTE) Calculated using the CKD-EPI Creatinine Equation (2021)    Anion gap 17 (H) 5 - 15    Comment: Performed at [facility], [address], [place], [place] [address]  CBC     Status: Abnormal   Collection Time: 02/20/23 12:32 AM  Result Value Ref Range   WBC 10.4 4.0 - 10.5 K/uL   RBC 3.53 (L) 4.22 - 5.81 MIL/uL   Hemoglobin 9.5 (L) 13.0 - 17.0 g/dL   HCT 54.0 (L) 98.1 - 19.1 %   MCV 81.3 80.0 - 100.0 fL   MCH 26.9 26.0 - 34.0 pg   MCHC 33.1 30.0 - 36.0 g/dL   RDW 47.8  29.5 - 62.1 %   Platelets 151 150 - 400 K/uL   nRBC 0.0 0.0 - 0.2 %    Comment: Performed at [facility], [address], [place], [place] [address]  Procalcitonin     Status: None   Collection Time: 02/20/23 12:32 AM  Result Value Ref Range   Procalcitonin 47.18 ng/mL    Comment:        Interpretation: PCT >= 10 ng/mL: Important systemic inflammatory response, almost exclusively due to severe bacterial sepsis or septic shock. (NOTE)       Sepsis PCT Algorithm           Lower Respiratory Tract                                      Infection PCT Algorithm    ----------------------------     ----------------------------         PCT < 0.25 ng/mL                PCT < 0.10 ng/mL          Strongly encourage             Strongly discourage   discontinuation of antibiotics    initiation of antibiotics    ----------------------------     -----------------------------       PCT 0.25 - 0.50 ng/mL            PCT 0.10 - 0.25 ng/mL               OR       >80% decrease in PCT            Discourage initiation of                                            antibiotics      Encourage discontinuation           of antibiotics    ----------------------------     -----------------------------         PCT >= 0.50 ng/mL              PCT 0.26 - 0.50 ng/mL  AND       <80% decrease in PCT             Encourage initiation of                                             antibiotics       Encourage continuation           of antibiotics    ----------------------------     -----------------------------        PCT >= 0.50 ng/mL                  PCT > 0.50 ng/mL               AND         increase in PCT                  Strongly encourage                                      initiation of antibiotics    Strongly encourage escalation           of antibiotics                                     -----------------------------                                           PCT <= 0.25 ng/mL                                                  OR                                        > 80% decrease in PCT                                      Discontinue / Do not initiate                                             antibiotics  Performed at [facility], [address] [place], [place] [address]   Glucose, capillary     Status: Abnormal   Collection Time: 02/20/23  1:23 AM  Result Value Ref Range   Glucose-Capillary 118 (H) 70 - 99 mg/dL    Comment: Glucose reference range applies only to samples taken after fasting for at least 8 hours.  Glucose, capillary     Status: None   Collection Time: 02/20/23  3:07 AM  Result Value Ref Range   Glucose-Capillary 96 70 - 99 mg/dL    Comment: Glucose reference  range applies only to samples taken after fasting for at least 8 hours.  Glucose, capillary     Status: Abnormal   Collection Time: 02/20/23  7:37 AM  Result Value Ref Range   Glucose-Capillary 176 (H) 70 - 99 mg/dL    Comment: Glucose reference range applies only to samples taken after fasting for at least 8 hours.  Culture, Respiratory w Gram Stain     Status: None (Preliminary result)   Collection Time: 02/20/23  9:21 AM   Specimen: Tracheal Aspirate; Respiratory  Result Value Ref Range   Specimen Description TRACHEAL ASPIRATE    Special Requests NONE    Gram Stain      NO WBC SEEN NO ORGANISMS SEEN Performed at [facility], [address] [place], [place] [address]    Culture PENDING    Report Status PENDING   SARS Coronavirus 2 by RT PCR (hospital order, performed in [facility] hospital lab) *cepheid single result test* Anterior Nasal Swab     Status: None   Collection Time: 02/20/23 10:35 AM   Specimen: Anterior Nasal Swab  Result Value Ref Range   SARS Coronavirus 2 by RT PCR NEGATIVE NEGATIVE    Comment: Performed at [facility], [address], [place], [place] [address]  Respiratory (~20 pathogens) panel by PCR     Status: None   Collection  Time: 02/20/23 10:35 AM   Specimen: Nasopharyngeal Swab; Respiratory  Result Value Ref Range   Adenovirus NOT DETECTED NOT DETECTED   Coronavirus 229E NOT DETECTED NOT DETECTED    Comment: (NOTE) The Coronavirus on the Respiratory Panel, DOES NOT test for the novel  Coronavirus (2019 nCoV)    Coronavirus HKU1 NOT DETECTED NOT DETECTED   Coronavirus NL63 NOT DETECTED NOT DETECTED   Coronavirus OC43 NOT DETECTED NOT DETECTED   Metapneumovirus NOT DETECTED NOT DETECTED   Rhinovirus / Enterovirus NOT DETECTED NOT DETECTED   Influenza A NOT DETECTED NOT DETECTED   Influenza B NOT DETECTED NOT DETECTED   Parainfluenza Virus 1 NOT DETECTED NOT DETECTED   Parainfluenza Virus 2 NOT DETECTED NOT DETECTED   Parainfluenza Virus 3 NOT DETECTED NOT DETECTED   Parainfluenza Virus 4 NOT DETECTED NOT DETECTED   Respiratory Syncytial Virus NOT DETECTED NOT DETECTED   Bordetella pertussis NOT DETECTED NOT DETECTED   Bordetella Parapertussis NOT DETECTED NOT DETECTED   Chlamydophila pneumoniae NOT DETECTED NOT DETECTED   Mycoplasma pneumoniae NOT DETECTED NOT DETECTED    Comment: Performed at [facility], [address], [place], [place] [address]  Glucose, capillary     Status: Abnormal   Collection Time: 02/20/23 11:14 AM  Result Value Ref Range   Glucose-Capillary 193 (H) 70 - 99 mg/dL    Comment: Glucose reference range applies only to samples taken after fasting for at least 8 hours.  Triglycerides     Status: Abnormal   Collection Time: 02/20/23  1:05 PM  Result Value Ref Range   Triglycerides 156 (H) <150 mg/dL    Comment: Performed at [facility], [address], [place], [place] [address]  Glucose, capillary     Status: Abnormal   Collection Time: 02/20/23  3:15 PM  Result Value Ref Range   Glucose-Capillary 305 (H) 70 - 99 mg/dL    Comment: Glucose reference range applies only to samples taken after fasting for at least 8 hours.  Glucose, capillary     Status: Abnormal    Collection Time: 02/20/23  7:15 PM  Result Value Ref Range   Glucose-Capillary 329 (H) 70 - 99 mg/dL    Comment: Glucose reference range applies only to samples taken after fasting for at least 8 hours.  Glucose, capillary     Status: Abnormal   Collection Time: 02/20/23 11:32 PM  Result Value Ref Range   Glucose-Capillary 399 (H) 70 - 99 mg/dL    Comment: Glucose reference range applies only to samples taken after fasting for at least 8 hours.  Glucose, capillary     Status: Abnormal   Collection Time: 02/20/23 11:44 PM  Result Value Ref Range   Glucose-Capillary 401 (H) 70 - 99 mg/dL    Comment: Glucose reference range applies only to samples taken after fasting for at least 8 hours.  Basic metabolic panel     Status: Abnormal   Collection Time: 02/21/23 12:13 AM  Result Value Ref Range   Sodium 135 135 - 145 mmol/L   Potassium 5.2 (H) 3.5 - 5.1 mmol/L   Chloride 105 98 - 111 mmol/L   CO2 18 (L) 22 - 32 mmol/L   Glucose, Bld 434 (H) 70 - 99 mg/dL    Comment: Glucose reference range applies only to samples taken after fasting for at least 8 hours.   BUN 89 (H) 8 - 23 mg/dL   Creatinine, Ser 6.64 (H) 0.61 - 1.24 mg/dL   Calcium 8.0 (L) 8.9 - 10.3 mg/dL   GFR, Estimated 15 (L) >60 mL/min    Comment: (NOTE) Calculated using the CKD-EPI Creatinine Equation (2021)    Anion gap 12 5 - 15    Comment: Performed at [facility], [address], [place], [place] [address]  Basic metabolic panel     Status: Abnormal   Collection Time: 02/21/23 12:24 AM  Result Value Ref Range   Sodium 134 (L) 135 - 145 mmol/L   Potassium 5.2 (H) 3.5 - 5.1 mmol/L   Chloride 104 98 - 111 mmol/L   CO2 17 (L) 22 - 32 mmol/L   Glucose, Bld 426 (H) 70 - 99 mg/dL    Comment: Glucose reference range applies only to samples taken after fasting for at least 8 hours.   BUN 88 (H) 8 - 23 mg/dL   Creatinine, Ser 4.25 (H) 0.61 - 1.24 mg/dL   Calcium 8.0 (L) 8.9 - 10.3 mg/dL   GFR, Estimated 15 (L) >60  mL/min    Comment: (NOTE) Calculated using the CKD-EPI Creatinine Equation (2021)    Anion gap 13 5 - 15    Comment: Performed at [facility], [address] [place], [place] [address]  Glucose, capillary     Status: Abnormal   Collection Time: 02/21/23 12:45 AM  Result Value Ref Range   Glucose-Capillary 381 (H) 70 - 99 mg/dL    Comment: Glucose reference range applies only to samples taken after fasting for at least 8 hours.  I-STAT 7, (LYTES, BLD GAS, ICA, H+H)     Status: Abnormal   Collection Time: 02/21/23 12:45 AM  Result Value Ref Range   pH, Arterial 7.252 (L) 7.35 - 7.45   pCO2 arterial 41.9 32 - 48 mmHg   pO2, Arterial 77 (L) 83 - 108 mmHg   Bicarbonate 18.5 (L) 20.0 - 28.0 mmol/L   TCO2 20 (L) 22 - 32 mmol/L   O2 Saturation 93 %   Acid-base deficit 8.0 (H) 0.0 - 2.0 mmol/L   Sodium 136 135 - 145 mmol/L  Potassium 5.0 3.5 - 5.1 mmol/L   Calcium, Ion 1.12 (L) 1.15 - 1.40 mmol/L   HCT 24.0 (L) 39.0 - 52.0 %   Hemoglobin 8.2 (L) 13.0 - 17.0 g/dL   Patient temperature 16.1 F    Sample type ARTERIAL   Glucose, capillary     Status: Abnormal   Collection Time: 02/21/23  2:03 AM  Result Value Ref Range   Glucose-Capillary 335 (H) 70 - 99 mg/dL    Comment: Glucose reference range applies only to samples taken after fasting for at least 8 hours.  Beta-hydroxybutyric acid     Status: None   Collection Time: 02/21/23  2:45 AM  Result Value Ref Range   Beta-Hydroxybutyric Acid 0.13 0.05 - 0.27 mmol/L    Comment: Performed at [facility], [address] [place], [place] [address]  Glucose, capillary     Status: Abnormal   Collection Time: 02/21/23  3:06 AM  Result Value Ref Range   Glucose-Capillary 299 (H) 70 - 99 mg/dL    Comment: Glucose reference range applies only to samples taken after fasting for at least 8 hours.  Glucose, capillary     Status: Abnormal   Collection Time: 02/21/23  4:09 AM  Result Value Ref Range   Glucose-Capillary 222 (H) 70 - 99  mg/dL    Comment: Glucose reference range applies only to samples taken after fasting for at least 8 hours.  Basic metabolic panel     Status: Abnormal   Collection Time: 02/21/23  4:14 AM  Result Value Ref Range   Sodium 134 (L) 135 - 145 mmol/L   Potassium 5.2 (H) 3.5 - 5.1 mmol/L   Chloride 105 98 - 111 mmol/L   CO2 18 (L) 22 - 32 mmol/L   Glucose, Bld 334 (H) 70 - 99 mg/dL    Comment: Glucose reference range applies only to samples taken after fasting for at least 8 hours.   BUN 88 (H) 8 - 23 mg/dL   Creatinine, Ser 5.40 (H) 0.61 - 1.24 mg/dL   Calcium 7.8 (L) 8.9 - 10.3 mg/dL   GFR, Estimated 15 (L) >60 mL/min    Comment: (NOTE) Calculated using the CKD-EPI Creatinine Equation (2021)    Anion gap 11 5 - 15    Comment: Performed at [facility], [address] [place], [place] [address]  Beta-hydroxybutyric acid     Status: None   Collection Time: 02/21/23  4:14 AM  Result Value Ref Range   Beta-Hydroxybutyric Acid 0.13 0.05 - 0.27 mmol/L    Comment: Performed at [facility], [address] [place], [place] [address]  Triglycerides     Status: Abnormal   Collection Time: 02/21/23  4:14 AM  Result Value Ref Range   Triglycerides 176 (H) <150 mg/dL    Comment: Performed at [facility], [address], [place], [place] [address]  Glucose, capillary     Status: Abnormal   Collection Time: 02/21/23  5:08 AM  Result Value Ref Range   Glucose-Capillary 328 (H) 70 - 99 mg/dL    Comment: Glucose reference range applies only to samples taken after fasting for at least 8 hours.  Glucose, capillary     Status: Abnormal   Collection Time: 02/21/23  6:15 AM  Result Value Ref Range   Glucose-Capillary 331 (H) 70 - 99 mg/dL    Comment: Glucose reference range applies only to samples taken after fasting for at least 8 hours.  Glucose, capillary     Status: Abnormal   Collection Time: 02/21/23  7:20 AM  Result Value Ref Range   Glucose-Capillary 289 (H) 70 - 99  mg/dL    Comment: Glucose reference range applies only to samples taken after fasting for at least 8 hours.  Glucose, capillary     Status: Abnormal   Collection Time: 02/21/23  8:28 AM  Result Value Ref Range   Glucose-Capillary 249 (H) 70 - 99 mg/dL    Comment: Glucose reference range applies only to samples taken after fasting for at least 8 hours.   US RENAL  Result Date: 02/20/2023 CLINICAL DATA:  Acute kidney injury EXAM: RENAL / URINARY TRACT ULTRASOUND COMPLETE COMPARISON:  CT abdomen and pelvis dated November 02, 2021 FINDINGS: Right Kidney: Renal measurements: 11.8 x 5.7 x 5.2 cm = volume: 182 mL. Echogenicity within normal limits. No mass visualized. Simple appearing right renal sinus cyst measuring 7.2 x 3.8 x 3 5 cm. Mild right caliectasis. Left Kidney: Renal measurements: 10.4 x 5.0 x 4.4 cm = volume: 121 mL. Echogenicity within normal limits. No mass or hydronephrosis visualized. Simple appearing cyst of the interpolar region of the left kidney measuring 2.6 x 2.7 x 3.1 cm. Simple appearing cyst of the upper pole of the left kidney measuring 2.6 x 2.7 x 3.1 cm. Bladder: Bladder is decompressed. Other: None. IMPRESSION: 1. Mild right caliectasis, similar to prior CT. 2. No evidence of left hydronephrosis. 3. Simple appearing bilateral renal cysts. Electronically Signed   By: Allegra Lai M.D.   On: 02/20/2023 16:59   Portable Chest x-ray  Result Date: 02/19/2023 CLINICAL DATA:  [id number] Endotracheal tube present [id number] EXAM: PORTABLE CHEST 1 VIEW COMPARISON:  Chest x-ray [id number] a.m., CT chest 09/28/2022 FINDINGS: Endotracheal tube with tip terminating 5 cm above the carina. Enteric tube coursing below the hemidiaphragm with tip and side port collimated off view. The heart and mediastinal contours are within normal limits. Interval worsening of right hilar consolidation. Persistent grossly stable fluffy airspace and interstitial opacities. No pulmonary edema. No pleural effusion. Left  costophrenic angle collimated off view. No pneumothorax. No acute osseous abnormality. IMPRESSION: 1. Lines and tubes as above. 2. Interval worsening of right hilar consolidation. 3. Persistent grossly stable fluffy airspace and interstitial opacities. Electronically Signed   By: Tish Frederickson M.D.   On: 02/19/2023 23:54   CT HEAD WO CONTRAST ( )  Result Date: 02/19/2023 CLINICAL DATA:  Stroke follow-up, lethargy EXAM: CT HEAD WITHOUT CONTRAST TECHNIQUE: Contiguous axial images were obtained from the base of the skull through the vertex without intravenous contrast. RADIATION DOSE REDUCTION: This exam was performed according to the departmental dose-optimization program which includes automated exposure control, adjustment of the mA and/or kV according to patient size and/or use of iterative reconstruction technique. COMPARISON:  Brain MRI and CT head 1 day prior FINDINGS: Brain: There is hypodensity in the right insula and parieto-occipital region consistent with evolving acute infarcts in the MCA distribution, better seen on the MRI from 1 day prior. Petechial hemorrhage is again seen at both locations. There is no mass effect or midline shift There is no new acute territorial infarct. There is no other acute intracranial hemorrhage or extra-axial fluid collection. The pituitary and suprasellar region are normal. There is no solid mass lesion. There is no midline shift. Vascular: A right MCA stent is noted. Skull: Normal. Negative for fracture or focal lesion. Sinuses/Orbits: Small retention cysts are noted in the maxillary sinuses. Bilateral lens implants and  a left scleral buckle are noted. Other: The mastoid air cells and middle ear cavities are clear. 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 Electronically Signed   By: Lesia Hausen M.D.   On: 02/19/2023 19:24   DG Abd Portable 1V  Result Date: 02/19/2023 CLINICAL  DATA:  [id number] Encounter for imaging study to confirm nasogastric (NG) tube placement [id number] EXAM: PORTABLE ABDOMEN - 1 VIEW COMPARISON:  02/28/2022 FINDINGS: Limited radiograph of the lower chest and upper abdomen was obtained for the purposes of enteric tube localization. Enteric tube is seen coursing below the diaphragm with distal tip and side port terminating within the expected location of the gastric body. Right greater than left bibasilar airspace consolidations. IMPRESSION: Enteric tube terminates within the expected location of the gastric body. Electronically Signed   By: Duanne Guess D.O.   On: 02/19/2023 17:47    PMH:   Past Medical History:  Diagnosis Date   Congenital ureterocele    Diabetes mellitus without complication (HCC)    Hypertension    Prostate cancer (HCC)     PSH:   Past Surgical History:  Procedure Laterality Date   COLONOSCOPY W/ POLYPECTOMY     EYE SURGERY     IR ANGIO INTRA EXTRACRAN SEL COM CAROTID INNOMINATE UNI L MOD SED  2023/03/09   IR ANGIO VERTEBRAL SEL VERTEBRAL UNI L MOD SED  2023-03-09   IR CT HEAD LTD  March 09, 2023   IR CT HEAD LTD  09-Mar-2023   IR PERCUTANEOUS ART THROMBECTOMY/INFUSION INTRACRANIAL INC DIAG ANGIO  Mar 09, 2023   RADIOLOGY WITH ANESTHESIA N/A 03-09-2023   Procedure: IR WITH ANESTHESIA;  Surgeon: Radiologist, Medication, MD;  Location: [facility] OR;  Service: Radiology;  Laterality: N/A;    Allergies:  Allergies  Allergen Reactions   Benazepril Other (See Comments)   Ezetimibe Other (See Comments)   Losartan Potassium Other (See Comments)    Medications:   Prior to Admission medications   Medication Sig Start Date End Date Taking? Authorizing Provider  amLODipine (NORVASC) 10 MG tablet TAKE 1 TABLET BY MOUTH EVERY DAY 06/23/21  Yes Iran Ouch, MD  aspirin EC 81 MG tablet Take 1 tablet (81 mg total) by mouth daily. With food 12/10/17  Yes Emokpae, Courage, MD  atorvastatin (LIPITOR) 80 MG tablet Take 1 tablet (80 mg total) by mouth  daily. In evening 12/10/17  Yes Emokpae, Courage, MD  carvedilol (COREG) 25 MG tablet Take 25 mg by mouth 2 (two) times daily with a meal. 07/13/22  Yes [provider]  Continuous Blood Gluc Sensor (FREESTYLE LIBRE 2 SENSOR) MISC Change every 14 days to monitor blood glucose continually  DX: E11.59   Yes [provider]  FARXIGA 10 MG TABS tablet Take 10 mg by mouth daily. 12/22/20  Yes [provider]  glucose blood (ONETOUCH VERIO) test strip USE TO TEST BLOOD SUGAR 3 TIMES DAILY OR AS DIRECTED. E11.65 (HMO PLAN) 11/06/17  Yes [provider]  Insulin Pen Needle (B-D ULTRAFINE III SHORT PEN) 31G X 8 MM MISC use 3 needle as directed with each meal- DX E11.59 05/16/11  Yes [provider]  losartan (COZAAR) 25 MG tablet Take 25 mg by mouth daily. 09/19/22  Yes [provider]  OneTouch Delica Lancets 33G MISC Use to test sugar 3 times daily or as instructed. E11.65 01/12/17  Yes [provider]  TOUJEO SOLOSTAR 300 UNIT/ML Solostar Pen Inject 60 Units into  the skin daily. 10/31/22  Yes [provider]  triamterene-hydrochlorothiazide (DYAZIDE) 37.5-25 MG capsule Take 1 capsule by mouth daily.   Yes [provider]  PRALUENT 150 MG/ML SOAJ DOSE EVEY 2 WEEKS AS DIRECTED. Patient not taking: Reported on 01/19/2023 11/28/19   [provider]    Discontinued Meds:   Medications Discontinued During This Encounter  Medication Reason   oseltamivir (TAMIFLU) capsule 75 mg P&T Policy: Renal Dose Adjustment    albuterol (PROVENTIL) (2.5 MG/3ML) 0.083% nebulizer solution 2.5 mg    famotidine (PEPCID) tablet 20 mg    insulin aspart (novoLOG) injection 0-9 Units    atorvastatin (LIPITOR) tablet 40 mg    aspirin chewable tablet 81 mg    ticagrelor (BRILINTA) tablet 90 mg    COVID-19 mRNA vaccine 2023-2024 (COMIRNATY) syringe Completed Course   influenza vaccine adjuvanted (FLUAD) 0.5 ML injection Completed Course   Zoster  Vaccine Adjuvanted [facility]) injection Completed Course   Zoster Vaccine Adjuvanted [facility]) injection Completed Course   0.9 %  sodium chloride infusion Completed Course   DUREZOL 0.05 % EMUL Completed Course   Oral care mouth rinse    fentaNYL (SUBLIMAZE) injection 25 mcg    atropine injection 0.4 mg    omeprazole (PRILOSEC) 40 MG capsule Discontinued by provider   feeding supplement (VITAL HIGH PROTEIN) liquid 1,000 mL    thiamine (VITAMIN B1) injection 100 mg    insulin aspart (novoLOG) injection 0-15 Units    insulin glargine-yfgn (SEMGLEE) injection 30 Units    thiamine (VITAMIN B1) tablet 100 mg    oseltamivir (TAMIFLU) capsule 30 mg    Oral care mouth rinse    norepinephrine (LEVOPHED) 4mg  in (0.016 mg/mL) premix infusion    amLODipine (NORVASC) tablet 10 mg    propofol (DIPRIVAN) 1000 MG/100ML infusion    docusate (COLACE) 50 MG/5ML liquid 100 mg    polyethylene glycol (MIRALAX / GLYCOLAX) packet 17 g    fentaNYL (SUBLIMAZE) injection 25-100 mcg    feeding supplement (PROSource TF20) liquid 60 mL    feeding supplement (VITAL 1.5 CAL) liquid 1,000 mL    insulin glargine-yfgn (SEMGLEE) injection 40 Units    clevidipine (CLEVIPREX) infusion 0.5 mg/mL    aspirin chewable tablet 81 mg    Oral care mouth rinse    Oral care mouth rinse Duplicate   succinylcholine (ANECTINE) 200 MG/10ML syringe Returned to ADS   pantoprazole (PROTONIX) injection 40 mg    famotidine (PEPCID) tablet 20 mg    aspirin suppository 300 mg    feeding supplement (PIVOT 1.5 CAL) liquid 1,000 mL    phenylephrine 80 mcg/10 mL injection    cefTRIAXone (ROCEPHIN) 2 g in sodium chloride 0.9 % 100 mL IVPB    0.9 %  sodium chloride infusion    oseltamivir (TAMIFLU) capsule 30 mg    methylPREDNISolone sodium succinate (SOLU-MEDROL) 125 mg/2 mL injection 80 mg    insulin glargine-yfgn (SEMGLEE) injection 30 Units    insulin aspart (novoLOG) injection 0-20 Units    insulin glargine-yfgn (SEMGLEE)  injection 40 Units    insulin regular, human (MYXREDLIN) 100 units/ 100 mL infusion    polyethylene glycol (MIRALAX / GLYCOLAX) packet 17 g     Social History:  reports that he quit smoking about 44 years ago. His smoking use included cigarettes. He has never used smokeless tobacco. He reports that he does not drink alcohol and does not use drugs.  Family History:   Family History  Problem Relation Age of Onset  Colon cancer Neg Hx     Blood pressure (!) 77/33, pulse (!) 56, temperature 97.6 F (36.4 C), temperature source Oral, resp. rate 20, height 6\' 2"  (1.88 m), weight 87.2 kg, SpO2 100%. General appearance: cooperative and agitated but following commands Head: Normocephalic, without obvious abnormality, atraumatic Eyes: negative Neck: no adenopathy, no carotid bruit, supple, symmetrical, trachea midline, and thyroid not enlarged, symmetric, no tenderness/mass/nodules Back: symmetric, no curvature. ROM normal. No CVA tenderness. Resp: rhonchi bibasilar Cardio: S1, S2 normal GI: soft, non-tender; bowel sounds normal; no masses,  no organomegaly Extremities: edema tr, moving all 4 ext Pulses: 2+ and symmetric Skin: Skin color, texture, turgor normal. No rashes or lesions       Nikai Quest, Len Blalock, MD 02/21/2023, 10:29 AM

## 2023-02-21 NOTE — Inpatient Diabetes Management (Signed)
Inpatient Diabetes Program Recommendations  AACE/ADA: New Consensus Statement on Inpatient Glycemic Control (2015)  Target Ranges:  Prepandial:   less than 140 mg/dL      Peak postprandial:   less than 180 mg/dL (1-2 hours)      Critically ill patients:  140 - 180 mg/dL   Lab Results  Component Value Date   GLUCAP 130 (H) 02/21/2023   HGBA1C 9.0 (H) 02/17/2023    Review of Glycemic Control  Latest Reference Range & Units 02/21/23 06:15 02/21/23 07:20 02/21/23 08:28 02/21/23 09:32 02/21/23 10:40  Glucose-Capillary 70 - 99 mg/dL 811 (H) 914 (H) 782 (H) 149 (H) 130 (H)   Diabetes history: DM 2 Outpatient Diabetes medications:  Farxiga 10 mg daily, Toujeo 60 units daily Current orders for Inpatient glycemic control:  Novolog 0-20 units q 4 hours Semglee 45 units daily  Inpatient Diabetes Program Recommendations:    Note insulin drip started overnight. Patient now extubated and insulin drip to be stopped. If tube feeds continued, may need tube feed coverage as well.  Will follow.   Thanks,  Beryl Meager, RN, BC-ADM Inpatient Diabetes Coordinator Pager [phone] (8a-5p)

## 2023-02-21 NOTE — Progress Notes (Addendum)
Went into atrial fibrillation spontaneously while resting comfortably in bed this afternoon. EKG to confirm. Rates between 80-110. Otherwise stable, no mental status change. 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 Will DC aspirin, continue ticagrelor to keep right MCA stent open. Start heparin infusion for atrial fibrillation.  Marrianne Mood MD 02/21/2023, 5:18 PM

## 2023-02-21 NOTE — Progress Notes (Signed)
STROKE TEAM PROGRESS NOTE   BRIEF HPI [name] is a 82 y.o. male with history of type 2 diabetes mellitus, prostate cancer, and essential hypertension who presented to the ED via POV after a syncopal episode in the vehicle today with associated foaming at the mouth and facial asymmetry   S/p TNK and IR mechanical thrombectomy of right M1 and right P2 with stent assisted angioplasty of right M1.  Patient is COVID-positive  SIGNIFICANT HOSPITAL EVENTS 8/9 presented as code stroke received TNK at 1650.  Mechanical thrombectomy of right M1 with stent and right P2.  COVID-positive 8/11 reintubated for hypoxia   INTERIM HISTORY/SUBJECTIVE Patient was  extubated this morning. Wife is at the bedside. She has met with palliative care team and wants full support for now. Renal function continues to worsen.nephrol;ogy has been consulted Patient is responsive to voice and follows commands, moving all extremities antigravity. No blink to threat on left.   OBJECTIVE  CBC    Component Value Date/Time   WBC 10.4 02/20/2023 0032   RBC 3.53 (L) 02/20/2023 0032   HGB 8.2 (L) 02/21/2023 0045   HCT 24.0 (L) 02/21/2023 0045   PLT 151 02/20/2023 0032   MCV 81.3 02/20/2023 0032   MCH 26.9 02/20/2023 0032   MCHC 33.1 02/20/2023 0032   RDW 13.4 02/20/2023 0032   LYMPHSABS 2.0 02/17/2023 1550   MONOABS 0.6 02/17/2023 1550   EOSABS 0.1 02/17/2023 1550   BASOSABS 0.1 02/17/2023 1550    BMET    Component Value Date/Time   NA 141 02/21/2023 1001   K 4.8 02/21/2023 1001   CL 105 02/21/2023 1001   CO2 19 (L) 02/21/2023 1001   GLUCOSE 144 (H) 02/21/2023 1001   BUN 92 (H) 02/21/2023 1001   CREATININE 3.86 (H) 02/21/2023 1001   CALCIUM 8.8 (L) 02/21/2023 1001   GFRNONAA 15 (L) 02/21/2023 1001    IMAGING past 24 hours IR PATIENT EVAL TECH 0-60 MINS  Result Date: 02/21/2023 Acey Lav, RT     02/21/2023  1:49 PM See progress note. JB    Vitals:   02/21/23 1200 02/21/23 1215 02/21/23  1230 02/21/23 1245  BP: (!) 153/73  (!) 151/59   Pulse: 86 78 77 77  Resp: (!) 25 (!) 27 (!) 26 18  Temp:      TempSrc:      SpO2: 91% 92% 92% 91%  Weight:      Height:         PHYSICAL EXAM General: Sedated and intubated elderly African-American male in no apparent distress Psych:  Mood and affect appropriate for situation CV: Regular rate and rhythm on monitor Respiratory:  Regular, unlabored respirations on room air GI: Abdomen soft and nontender   NEURO:  Mental Status: Exam drowsy.  Eyes are closed does  open eyes to voice and does follow commands with Speech/Language:  dysarthric. Mild nonfluent speech  Cranial Nerves:  II: PERRL.  III, IV, VI: EOMI. eyes are midline no blink to threat on left.  V: Sensation is intact to light touch and symmetrical to face.  VII: No facial weakness  VIII: hearing intact to voice. IX, X: Unable to assess XI: Unable to assess XII: tongue is midline without fasciculations. Motor:moving all extremities antigravity without focal weakness Tone: is normal and bulk is normal Sensation-once to noxious stimuli as above Coordination: Unable to assess Gait- deferred   ASSESSMENT/PLAN  Acute Ischemic Infarct:  right MCA/PCA s/p TNK and mechanical thrombectomy with stent  placement in right MCA Etiology: Embolic versus hypercoagulable state in the setting of acute COVID illness Code Stroke  CT head cute right MCA and PCA territory infarct ASPECTS 8   CTA head & neck  Occlusion of the right M1 segment and right PCA at the P1 P2 junction. 2. Severe narrowing in the supraclinoid ICA on the left and moderate to severe narrowing of the supraclinoid ICA on the right. 3. Moderate narrowing in the distal petrous segment of the left ICA. Post IR CT  patchy focal areas of hyperattenuation in the right parietal occipital region contrast staining versus subarachnoid hemorrhage/petechial hemorrhages.  Repeat CT head Persistent but regressed right  occipital hyperdensity since last  night. Favor subarachnoid hemorrhage. Underlying right PCA territory cytotoxic edema or encephalomalacia. MRI Multifocal acute ischemia within the right MCA territory. Petechial hemorrhage in the posterior right MCA territory, 2D Echo EF 55-60%. LV with moderate concentric hypertrophy. Grade II diastolic dysfunction  LDL 101 QMVH8I 9.0 VTE prophylaxis -heparin SQ aspirin 81 mg daily prior to admission, now on aspirin 81 mg daily and Brilinta 60 mg twice daily  Therapy recommendations: Pending Disposition: Pending   Hypertension Home meds: Norvasc 10 mg, Coreg 25 mg twice daily, losartan 25 mg daily, Dyazide  Stable Blood Pressure Goal: SBP less than 160  Norvasc added back   Hyperlipidemia Home meds: Atorvastatin 80 mg, resumed in hospital LDL 101, goal < 70 Continue statin at discharge  Diabetes type II UnControlled Home meds: Toujeo, Farxiga HgbA1c 9.0, goal < 7.0 CBGs SSI Recommend close follow-up with PCP for better DM control  Former tobacco Abuse Noted  Acute hypoxic respiratory failure with COVID-19 and flu ?  Aspiration pneumonia Managed per CCM  Propofol for sedation On ceftriaxone and azithromycin On remdesivir  Dysphagia Patient has post-stroke dysphagia, SLP consulted    There are no active orders of the following types: Diet, Nourishments.   Consider starting tube feeds   Other Active Problems CKD     Patient neurological exam shows improvement with patient arousable and able to follow commands and moves all 4 extremities except he does not blink to threat on the left.  Unfortunately he remains encephalopathic due to worsening renal function likely from contrast-induced nephropathy.  Prognosis is guarded at this time.  Long discussion at bedside with patient's wife about his condition and answered questions.  Discussed with critical care team.  Continue aspirin and Brilinta for his intracranial stent.  This patient  is critically ill and at significant risk of neurological worsening, death and care requires constant monitoring of vital signs, hemodynamics,respiratory and cardiac monitoring, extensive review of multiple databases, frequent neurological assessment, discussion with family, other specialists and medical decision making of high complexity.I have made any additions or clarifications directly to the above note.This critical care time does not reflect procedure time, or teaching time or supervisory time of PA/NP/Med Resident etc but could involve care discussion time.  I spent 30 minutes of neurocritical care time  in the care of  this patient.       Delia Heady, MD Medical Director [facility] Pager: [phone] 02/21/2023 1:56 PM    To contact Stroke Continuity provider, please refer to [email or web address]. After hours, contact General Neurology

## 2023-02-21 NOTE — Progress Notes (Signed)
eLink Physician-Brief Progress Note Patient Name: RASEAN MAYWEATHER DOB: 1940-10-22 MRN: [id number]   Date of Service  02/21/2023  HPI/Events of Note  Hyperglycemia 300-400s Prior CO2 17 AG 17 Ha2c 02/27/2023 -9.0    eICU Interventions  A/P: Suspect Mild DKA vs hyperglycemia BHA, VBG Start insulin gtt per protocol Hold on initial IVF resuscitation. Patient +9L since admission     Intervention Category Minor Interventions: Electrolytes abnormality - evaluation and management   Mechele Collin 02/21/2023, 12:04 AM

## 2023-02-21 NOTE — Progress Notes (Addendum)
Pharmacy Electrolyte Replacement  Recent Labs:  Recent Labs    02/19/23 0301 02/19/23 1802 02/21/23 1754  K 4.4   < > 4.0  MG 2.6*  --   --   PHOS 3.4  --   --   CREATININE 3.03*   < > 3.58*   < > = values in this interval not displayed.    Low Critical Values (K </= 2.5, Phos </= 1, Mg </= 1) Present: None  MD Contacted: Dr. Katrinka Blazing  Plan: KCl per tube. F/u K in a.m. MD would also like to add 2gm IV Mg.  Christoper Fabian, PharmD, BCPS Please see amion for complete clinical pharmacist phone list 02/21/2023 7:00 PM

## 2023-02-21 NOTE — Procedures (Signed)
See progress note. JB

## 2023-02-21 NOTE — Procedures (Signed)
Extubation Procedure Note  Patient Details:   Name: Xavier [name] DOB: Jan 29, 1941 MRN: [id number]   Airway Documentation:    Vent end date: 02/21/23 Vent end time: 0929   Evaluation  O2 sats: stable throughout Complications: No apparent complications Patient did tolerate procedure well. Bilateral Breath Sounds: Clear, Diminished   Yes  Pt extubated  to 4L [place] per order. Pt had a positive cuff leak, a good strong cough and no stridor was noted. Pt is stable at this time.   Kerri Perches 02/21/2023, 9:30 AM

## 2023-02-21 NOTE — Plan of Care (Signed)
Problem: Education: Goal: Knowledge of disease or condition will improve Outcome: Progressing Goal: Knowledge of secondary prevention will improve (MUST DOCUMENT ALL) Outcome: Progressing Goal: Knowledge of patient specific risk factors will improve Loraine Leriche N/A or DELETE if not current risk factor) Outcome: Progressing   Problem: Ischemic Stroke/TIA Tissue Perfusion: Goal: Complications of ischemic stroke/TIA will be minimized Outcome: Progressing   Problem: Coping: Goal: Will verbalize positive feelings about self Outcome: Progressing Goal: Will identify appropriate support needs Outcome: Progressing   Problem: Health Behavior/Discharge Planning: Goal: Ability to manage health-related needs will improve Outcome: Progressing Goal: Goals will be collaboratively established with patient/family Outcome: Progressing   Problem: Self-Care: Goal: Ability to participate in self-care as condition permits will improve Outcome: Progressing Goal: Verbalization of feelings and concerns over difficulty with self-care will improve Outcome: Progressing Goal: Ability to communicate needs accurately will improve Outcome: Progressing   Problem: Nutrition: Goal: Risk of aspiration will decrease Outcome: Progressing Goal: Dietary intake will improve Outcome: Progressing   Problem: Education: Goal: Understanding of CV disease, CV risk reduction, and recovery process will improve Outcome: Progressing Goal: Individualized Educational Video(s) Outcome: Progressing   Problem: Activity: Goal: Ability to return to baseline activity level will improve Outcome: Progressing   Problem: Cardiovascular: Goal: Ability to achieve and maintain adequate cardiovascular perfusion will improve Outcome: Progressing Goal: Vascular access site(s) Level 0-1 will be maintained Outcome: Progressing   Problem: Health Behavior/Discharge Planning: Goal: Ability to safely manage health-related needs after  discharge will improve Outcome: Progressing   Problem: Education: Goal: Knowledge of General Education information will improve Description: Including pain rating scale, medication(s)/side effects and non-pharmacologic comfort measures Outcome: Progressing   Problem: Health Behavior/Discharge Planning: Goal: Ability to manage health-related needs will improve Outcome: Progressing   Problem: Clinical Measurements: Goal: Ability to maintain clinical measurements within normal limits will improve Outcome: Progressing Goal: Will remain free from infection Outcome: Progressing Goal: Diagnostic test results will improve Outcome: Progressing Goal: Respiratory complications will improve Outcome: Progressing Goal: Cardiovascular complication will be avoided Outcome: Progressing   Problem: Activity: Goal: Risk for activity intolerance will decrease Outcome: Progressing   Problem: Nutrition: Goal: Adequate nutrition will be maintained Outcome: Progressing   Problem: Coping: Goal: Level of anxiety will decrease Outcome: Progressing   Problem: Elimination: Goal: Will not experience complications related to bowel motility Outcome: Progressing Goal: Will not experience complications related to urinary retention Outcome: Progressing   Problem: Pain Managment: Goal: General experience of comfort will improve Outcome: Progressing   Problem: Safety: Goal: Ability to remain free from injury will improve Outcome: Progressing   Problem: Skin Integrity: Goal: Risk for impaired skin integrity will decrease Outcome: Progressing   Problem: Activity: Goal: Ability to tolerate increased activity will improve Outcome: Progressing   Problem: Respiratory: Goal: Ability to maintain a clear airway and adequate ventilation will improve Outcome: Progressing   Problem: Role Relationship: Goal: Method of communication will improve Outcome: Progressing   Problem: Education: Goal:  Knowledge of risk factors and measures for prevention of condition will improve Outcome: Progressing   Problem: Coping: Goal: Psychosocial and spiritual needs will be supported Outcome: Progressing   Problem: Respiratory: Goal: Will maintain a patent airway Outcome: Progressing Goal: Complications related to the disease process, condition or treatment will be avoided or minimized Outcome: Progressing   Problem: Education: Goal: Ability to describe self-care measures that may prevent or decrease complications (Diabetes Survival Skills Education) will improve Outcome: Progressing Goal: Individualized Educational Video(s) Outcome: Progressing   Problem: Coping: Goal: Ability to adjust  to condition or change in health will improve Outcome: Progressing   Problem: Fluid Volume: Goal: Ability to maintain a balanced intake and output will improve Outcome: Progressing   Problem: Health Behavior/Discharge Planning: Goal: Ability to identify and utilize available resources and services will improve Outcome: Progressing Goal: Ability to manage health-related needs will improve Outcome: Progressing

## 2023-02-21 NOTE — Progress Notes (Signed)
Was notified by Algis Greenhouse PA-C to have the sheath removed per the order that was placed at an earlier date. Christina Carpentieri and myself arrived at the patients room and confirmed name and DOB upon arrival. Dressing was removed from the left groin and the 4 french sheath was removed from the left groin. Quik clot was placed and manual pressure was held by [employer]. The nurse was notified that the groin was fine and no signs of hematoma. The nurse was instructed to call with any questions and was instructed to keep the patient flat for 4 hours and to removed the quikclot in 24 hours. Quik clot and tegaderm was placed.   Earlie Server RT(R).

## 2023-02-22 ENCOUNTER — Inpatient Hospital Stay (HOSPITAL_COMMUNITY): Payer: PPO

## 2023-02-22 DIAGNOSIS — R509 Fever, unspecified: Secondary | ICD-10-CM | POA: Diagnosis not present

## 2023-02-22 DIAGNOSIS — I639 Cerebral infarction, unspecified: Secondary | ICD-10-CM | POA: Diagnosis not present

## 2023-02-22 LAB — HEPARIN LEVEL (UNFRACTIONATED)
Heparin Unfractionated: 0.28 [IU]/mL — ABNORMAL LOW (ref 0.30–0.70)
Heparin Unfractionated: 0.21 [IU]/mL — ABNORMAL LOW (ref 0.30–0.70)

## 2023-02-22 LAB — PROCALCITONIN: Procalcitonin: 14.08 ng/mL

## 2023-02-22 LAB — GLUCOSE, CAPILLARY
Glucose-Capillary: 102 mg/dL — ABNORMAL HIGH (ref 70–99)
Glucose-Capillary: 141 mg/dL — ABNORMAL HIGH (ref 70–99)
Glucose-Capillary: 157 mg/dL — ABNORMAL HIGH (ref 70–99)
Glucose-Capillary: 176 mg/dL — ABNORMAL HIGH (ref 70–99)
Glucose-Capillary: 198 mg/dL — ABNORMAL HIGH (ref 70–99)
Glucose-Capillary: 98 mg/dL (ref 70–99)

## 2023-02-22 MED ORDER — QUETIAPINE FUMARATE 25 MG PO TABS
25.0000 mg | ORAL_TABLET | Freq: Every day | ORAL | Status: DC
  Administered 2023-02-22 – 2023-02-28 (×7): 25 mg
  Filled 2023-02-22 (×7): qty 1

## 2023-02-22 MED ORDER — ACETAMINOPHEN 325 MG PO TABS: 650.0000 mg | ORAL_TABLET | Freq: Four times a day (QID) | ORAL | Status: DC

## 2023-02-22 MED ORDER — ACETAMINOPHEN 160 MG/5ML PO SOLN
650.0000 mg | Freq: Four times a day (QID) | ORAL | Status: DC
  Administered 2023-02-22 – 2023-02-26 (×15): 650 mg
  Filled 2023-02-22 (×15): qty 20.3

## 2023-02-22 MED ORDER — TICAGRELOR 90 MG PO TABS
90.0000 mg | ORAL_TABLET | Freq: Two times a day (BID) | ORAL | Status: DC
  Administered 2023-02-22 – 2023-03-01 (×14): 90 mg
  Filled 2023-02-22 (×14): qty 1

## 2023-02-22 MED ORDER — POTASSIUM CHLORIDE 20 MEQ PO PACK
40.0000 meq | PACK | Freq: Once | ORAL | Status: AC
  Administered 2023-02-22: 40 meq via ORAL
  Filled 2023-02-22: qty 2

## 2023-02-22 MED ORDER — ACETAMINOPHEN 650 MG RE SUPP: 650.0000 mg | Freq: Four times a day (QID) | RECTAL | Status: DC

## 2023-02-22 MED ORDER — POTASSIUM CHLORIDE 10 MEQ/100ML IV SOLN: 10.0000 meq | Freq: Once | INTRAVENOUS | Status: DC

## 2023-02-22 MED ORDER — FREE WATER
75.0000 mL | Status: DC
  Administered 2023-02-22 – 2023-02-24 (×12): 75 mL

## 2023-02-22 NOTE — Procedures (Signed)
Cortrak  Person Inserting Tube:  Pitts, Heather C, RD Tube Type:  Cortrak - 43 inches Tube Size:  10 Tube Location:  Left nare Secured by: Bridle Technique Used to Measure Tube Placement:  Marking at nare/corner of mouth Cortrak Secured At:  75 cm   Cortrak Tube Team Note:  Consult received to place a Cortrak feeding tube.   X-ray is required, abdominal x-ray has been ordered by the Cortrak team. Please confirm tube placement before using the Cortrak tube.   If the tube becomes dislodged please keep the tube and contact the Cortrak team at [email or web address] for replacement.  If after hours and replacement cannot be delayed, place a NG tube and confirm placement with an abdominal x-ray.    Heather P., RD, LDN, CNSC See AMiON for contact information    

## 2023-02-22 NOTE — Progress Notes (Signed)
ANTICOAGULATION CONSULT NOTE - Initial Consult  Pharmacy Consult for Heparin Indication: atrial fibrillation  Allergies  Allergen Reactions   Benazepril Other (See Comments)   Ezetimibe Other (See Comments)   Losartan Potassium Other (See Comments)    Patient Measurements: Height: 6\' 2"  (188 cm) Weight: 81.1 kg (178 lb 12.7 oz) IBW/kg (Calculated) : 82.2 Heparin Dosing Weight: 81.1 kg  Vital Signs: Temp: 98.7 F (37.1 C) (08/14 1938) Temp Source: Axillary (08/14 1938) BP: 135/50 (08/14 2100) Pulse Rate: 62 (08/14 1913)  Labs: Recent Labs    02/20/23 0032 02/21/23 0013 02/21/23 0045 02/21/23 0414 02/21/23 1001 02/21/23 1754 02/22/23 0615 02/22/23 1407 02/22/23 2147  HGB 9.5*  --  8.2*  --   --   --  9.7*  --   --   HCT 28.7*  --  24.0*  --   --   --  29.4*  --   --   PLT 151  --   --   --   --   --  202  --   --   HEPARINUNFRC  --   --   --   --   --   --  0.60 0.28* 0.21*  CREATININE 3.40*   < >  --    < > 3.86* 3.58* 3.38*  --   --    < > = values in this interval not displayed.    Estimated Creatinine Clearance: 19.3 mL/min (A) (by C-G formula based on SCr of 3.38 mg/dL (H)).   Medical History: Past Medical History:  Diagnosis Date   Congenital ureterocele    Diabetes mellitus without complication (HCC)    Hypertension    Prostate cancer (HCC)     Medications:  Facility-Administered Medications Prior to Admission  Medication Dose Route Frequency Provider Last Rate Last Admin   [DISCONTINUED] 0.9 %  sodium chloride infusion  500 mL Intravenous Continuous Danis, Starr Lake III, MD       Medications Prior to Admission  Medication Sig Dispense Refill Last Dose   amLODipine (NORVASC) 10 MG tablet TAKE 1 TABLET BY MOUTH EVERY DAY 90 tablet 1 Past Week   aspirin EC 81 MG tablet Take 1 tablet (81 mg total) by mouth daily. With food 30 tablet 5 Past Week   atorvastatin (LIPITOR) 80 MG tablet Take 1 tablet (80 mg total) by mouth daily. In evening 30 tablet 5  Past Week   carvedilol (COREG) 25 MG tablet Take 25 mg by mouth 2 (two) times daily with a meal.   Past Week   Continuous Blood Gluc Sensor (FREESTYLE LIBRE 2 SENSOR) MISC Change every 14 days to monitor blood glucose continually  DX: E11.59      FARXIGA 10 MG TABS tablet Take 10 mg by mouth daily.   Past Week   glucose blood (ONETOUCH VERIO) test strip USE TO TEST BLOOD SUGAR 3 TIMES DAILY OR AS DIRECTED. E11.65 (HMO PLAN)      Insulin Pen Needle (B-D ULTRAFINE III SHORT PEN) 31G X 8 MM MISC use 3 needle as directed with each meal- DX E11.59      losartan (COZAAR) 25 MG tablet Take 25 mg by mouth daily.   Past Week   OneTouch Delica Lancets 33G MISC Use to test sugar 3 times daily or as instructed. E11.65      TOUJEO SOLOSTAR 300 UNIT/ML Solostar Pen Inject 60 Units into the skin daily.   Past Week   triamterene-hydrochlorothiazide (DYAZIDE) 37.5-25 MG capsule Take 1  capsule by mouth daily.   Past Week   PRALUENT 150 MG/ML SOAJ DOSE EVEY 2 WEEKS AS DIRECTED. (Patient not taking: Reported on 01/19/2023)       Assessment: 82 y.o. M presented with stroke - received TNK 8/9 1650. 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  HL 0.21 - sub therapeutic on 800 units/hr D/w RN - No signs of bleeding. Hematuria difficult to assess d/t no urine output yet this shift  Goal of Therapy:  Heparin level 0.3-0.5 units/ml - will dose targeting lower end of goal given hematuria Monitor platelets by anticoagulation protocol: Yes   Plan:  Increase heparin to 900 units/hr Recheck 0700 HL  Daily HL, CBC F/u s/sx bleeding and resolution of hematuria

## 2023-02-22 NOTE — Progress Notes (Addendum)
ANTICOAGULATION CONSULT NOTE - Initial Consult  Pharmacy Consult for Heparin Indication: atrial fibrillation  Allergies  Allergen Reactions   Benazepril Other (See Comments)   Ezetimibe Other (See Comments)   Losartan Potassium Other (See Comments)    Patient Measurements: Height: 6\' 2"  (188 cm) Weight: 81.1 kg (178 lb 12.7 oz) IBW/kg (Calculated) : 82.2 Heparin Dosing Weight: 81.1 kg  Vital Signs: Temp: 98 F (36.7 C) (08/14 0400) Temp Source: Axillary (08/14 0400) BP: 137/67 (08/14 0700) Pulse Rate: 94 (08/13 2325)  Labs: Recent Labs    02/20/23 0032 02/21/23 0013 02/21/23 0045 02/21/23 0414 02/21/23 1001 02/21/23 1754 02/22/23 0615  HGB 9.5*  --  8.2*  --   --   --  9.7*  HCT 28.7*  --  24.0*  --   --   --  29.4*  PLT 151  --   --   --   --   --  202  HEPARINUNFRC  --   --   --   --   --   --  0.60  CREATININE 3.40*   < >  --    < > 3.86* 3.58* 3.38*   < > = values in this interval not displayed.    Estimated Creatinine Clearance: 19.3 mL/min (A) (by C-G formula based on SCr of 3.38 mg/dL (H)).   Medical History: Past Medical History:  Diagnosis Date   Congenital ureterocele    Diabetes mellitus without complication (HCC)    Hypertension    Prostate cancer (HCC)     Medications:  Facility-Administered Medications Prior to Admission  Medication Dose Route Frequency Provider Last Rate Last Admin   [DISCONTINUED] 0.9 %  sodium chloride infusion  500 mL Intravenous Continuous Danis, Starr Lake III, MD       Medications Prior to Admission  Medication Sig Dispense Refill Last Dose   amLODipine (NORVASC) 10 MG tablet TAKE 1 TABLET BY MOUTH EVERY DAY 90 tablet 1 Past Week   aspirin EC 81 MG tablet Take 1 tablet (81 mg total) by mouth daily. With food 30 tablet 5 Past Week   atorvastatin (LIPITOR) 80 MG tablet Take 1 tablet (80 mg total) by mouth daily. In evening 30 tablet 5 Past Week   carvedilol (COREG) 25 MG tablet Take 25 mg by mouth 2 (two) times daily  with a meal.   Past Week   Continuous Blood Gluc Sensor (FREESTYLE LIBRE 2 SENSOR) MISC Change every 14 days to monitor blood glucose continually  DX: E11.59      FARXIGA 10 MG TABS tablet Take 10 mg by mouth daily.   Past Week   glucose blood (ONETOUCH VERIO) test strip USE TO TEST BLOOD SUGAR 3 TIMES DAILY OR AS DIRECTED. E11.65 (HMO PLAN)      Insulin Pen Needle (B-D ULTRAFINE III SHORT PEN) 31G X 8 MM MISC use 3 needle as directed with each meal- DX E11.59      losartan (COZAAR) 25 MG tablet Take 25 mg by mouth daily.   Past Week   OneTouch Delica Lancets 33G MISC Use to test sugar 3 times daily or as instructed. E11.65      TOUJEO SOLOSTAR 300 UNIT/ML Solostar Pen Inject 60 Units into the skin daily.   Past Week   triamterene-hydrochlorothiazide (DYAZIDE) 37.5-25 MG capsule Take 1 capsule by mouth daily.   Past Week   PRALUENT 150 MG/ML SOAJ DOSE EVEY 2 WEEKS AS DIRECTED. (Patient not taking: Reported on 01/19/2023)  Assessment: 82 y.o. M presented with stroke - received TNK 8/9 1650. 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 Pt was on SQ heparin 5000 units TID - last dose 8/13 1430.  AM heparin level of 0.6, supratherapeutic for conservative goal. CBC otherwise stable. Per RN, patient has had pink-tinged urine.  Goal of Therapy:  Heparin level 0.3-0.5 units/ml - will dose targeting lower end of goal given hematuria Monitor platelets by anticoagulation protocol: Yes   Plan:  Decrease heparin gtt to 800 units/hr (decrease of 1.85 u/kg/h) Will f/u heparin level in 6 hours at 1400 Daily heparin level and CBC  Xavier [name], PharmD PGY2 Critical Care Pharmacy Resident Please see amion for complete clinical pharmacist phone list 02/22/2023,7:44 AM  02/22/23 1519 Addendum: Follow up level 28, will continue at 800u/h as RN reports urine is getting darker. Will check  another level and CBC at 2000.

## 2023-02-22 NOTE — Progress Notes (Addendum)
STROKE TEAM PROGRESS NOTE   BRIEF HPI [name] is a 82 y.o. male with history of type 2 diabetes mellitus, prostate cancer, and essential hypertension who presented to the ED via POV after a syncopal episode in the vehicle today with associated foaming at the mouth and facial asymmetry   S/p TNK and IR mechanical thrombectomy of right M1 and right P2 with stent assisted angioplasty of right M1.  Patient is COVID-positive  SIGNIFICANT HOSPITAL EVENTS 8/9 presented as code stroke received TNK at 1650.  Mechanical thrombectomy of right M1 with stent and right P2.  COVID-positive 8/11 reintubated for hypoxia  8/14 on high flow   INTERIM HISTORY/SUBJECTIVE Wife is at the bedside. Patient is on precedex gtt @ 0.26mcg. Also on heparin gtt. He has been diuresing, Cr down to 3.38.  Neurological exam is unchanged and stable   OBJECTIVE  CBC    Component Value Date/Time   WBC 13.1 (H) 02/22/2023 0615   RBC 3.66 (L) 02/22/2023 0615   HGB 9.7 (L) 02/22/2023 0615   HCT 29.4 (L) 02/22/2023 0615   PLT 202 02/22/2023 0615   MCV 80.3 02/22/2023 0615   MCH 26.5 02/22/2023 0615   MCHC 33.0 02/22/2023 0615   RDW 14.6 02/22/2023 0615   LYMPHSABS 2.0 02/17/2023 1550   MONOABS 0.6 02/17/2023 1550   EOSABS 0.1 02/17/2023 1550   BASOSABS 0.1 02/17/2023 1550    BMET    Component Value Date/Time   NA 143 02/22/2023 0615   K 3.8 02/22/2023 0615   CL 105 02/22/2023 0615   CO2 22 02/22/2023 0615   GLUCOSE 113 (H) 02/22/2023 0615   BUN 96 (H) 02/22/2023 0615   CREATININE 3.38 (H) 02/22/2023 0615   CALCIUM 9.3 02/22/2023 0615   GFRNONAA 17 (L) 02/22/2023 0615    IMAGING past 24 hours DG Abd Portable 1V  Result Date: 02/22/2023 CLINICAL DATA:  NG tube placement EXAM: PORTABLE ABDOMEN - 1 VIEW COMPARISON:  KUB 3 days prior FINDINGS: The enteric catheter tip is in the distal stomach. There is mild gaseous distention of the stomach and large bowel in the imaged upper abdomen. There is no  definite free intraperitoneal air. Extensive airspace disease is again seen in the right lower lobe with a probable layering pleural effusion. IMPRESSION: 1. Enteric catheter tip in the distal stomach. 2. Extensive airspace disease again seen in the right lower lobe with a probable layering pleural effusion. Electronically Signed   By: Lesia Hausen M.D.   On: 02/22/2023 10:48   IR PATIENT EVAL TECH 0-60 MINS  Result Date: 02/21/2023 Acey Lav, RT     02/21/2023  1:49 PM See progress note. JB    Vitals:   02/22/23 0800 02/22/23 1032 02/22/23 1050 02/22/23 1115  BP:  (!) 158/83    Pulse:   (!) 112   Resp: (!) 31     Temp:    (!) 101.1 F (38.4 C)  TempSrc:    Axillary  SpO2: 93%     Weight: 81.1 kg     Height: 6\' 2"  (1.88 m)        PHYSICAL EXAM General: drowsy elderly African-American male in no apparent distress on high flow and precedex gtt Psych:  Mood and affect appropriate for situation CV: Regular rate and rhythm on monitor Respiratory:  Regular, unlabored respirations on room air GI: Abdomen soft and nontender   NEURO:  Mental Status: Exam drowsy.  Eyes are closed does  open eyes to  voice and does follow commands on both sides  Speech/Language:  dysarthric. Mild nonfluent speech  Cranial Nerves:  II: PERRL.  III, IV, VI: EOMI. eyes are midline no blink to threat on left.  V: Sensation is intact to light touch and symmetrical to face.  VII: No facial weakness  VIII: hearing intact to voice. IX, X: Unable to assess XI: Unable to assess XII: tongue is midline without fasciculations. Motor:moving all extremities antigravity without focal weakness Tone: is normal and bulk is normal Sensation-once to noxious stimuli as above Coordination: Unable to assess Gait- deferred   ASSESSMENT/PLAN  Acute Ischemic Infarct:  right MCA/PCA s/p TNK and mechanical thrombectomy with stent placement in right MCA Etiology: Embolic versus hypercoagulable state in the setting of  acute COVID illness Code Stroke  CT head cute right MCA and PCA territory infarct ASPECTS 8   CTA head & neck  Occlusion of the right M1 segment and right PCA at the P1 P2 junction. 2. Severe narrowing in the supraclinoid ICA on the left and moderate to severe narrowing of the supraclinoid ICA on the right. 3. Moderate narrowing in the distal petrous segment of the left ICA. Post IR CT  patchy focal areas of hyperattenuation in the right parietal occipital region contrast staining versus subarachnoid hemorrhage/petechial hemorrhages.  Repeat CT head Persistent but regressed right occipital hyperdensity since last  night. Favor subarachnoid hemorrhage. Underlying right PCA territory cytotoxic edema or encephalomalacia. MRI Multifocal acute ischemia within the right MCA territory. Petechial hemorrhage in the posterior right MCA territory, 2D Echo EF 55-60%. LV with moderate concentric hypertrophy. Grade II diastolic dysfunction  LDL 101 [id number] 9.0 VTE prophylaxis -Heparin IV  aspirin 81 mg daily prior to admission, now on aspirin 81 mg daily and heparin IV and Brilinta 60 mg twice daily  Therapy recommendations: Pending Disposition: Pending   Hypertension Home meds: Norvasc 10 mg, Coreg 25 mg twice daily, losartan 25 mg daily, Dyazide  Stable Blood Pressure Goal: SBP less than 160  Norvasc added back   Hyperlipidemia Home meds: Atorvastatin 80 mg, resumed in hospital LDL 101, goal < 70 Continue statin at discharge  New onset A fib  On heparin gtt transition to DOAC when able    Diabetes type II UnControlled Home meds: Toujeo, Farxiga HgbA1c 9.0, goal < 7.0 CBGs SSI Recommend close follow-up with PCP for better DM control  Former tobacco Abuse Noted  Acute hypoxic respiratory failure with COVID-19 and flu ?  Aspiration pneumonia Managed per CCM  Extubated on high flow  ceftriaxone and azithromycin d/c  No on Zosyn  Completed  remdesivir  Dysphagia Patient has  post-stroke dysphagia, SLP consulted    There are no active orders of the following types: Diet, Nourishments.   Consider starting tube feeds   Other Active Problems CKD   Gevena Mart DNP, ACNPC-AG  [facility]  I have personally obtained history,examined this patient, reviewed notes, independently viewed imaging studies, participated in medical decision making and plan of care.ROS completed by me personally and pertinent positives fully documented  I have made any additions or clarifications directly to the above note. Agree with note above.  Patient neurological exam remains unchanged with dysarthria and left facial droop and he has diuresed and renal failure appears stable.  Prognosis remains guarded.  Long discussion with wife at the bedside and answered questions.  Discussed with critical care team.  Continue IV heparin and Brilinta.  Mobilize out of bed as tolerated and therapy consults.  Hopefully  transfer to rehab in the next few days unless his condition worsens then consider palliative care.  Stroke team will sign off.  Kindly call for questions.  Greater than 50% time during this 35-minute visit was spent on counseling and coordination of care and discussion with patient and family and care team and answering questions  Delia Heady, MD Medical Director [facility] Pager: [phone] 02/22/2023 1:09 PM    To contact Stroke Continuity provider, please refer to [email or web address]. After hours, contact General Neurology

## 2023-02-22 NOTE — Procedures (Signed)
Cortrak  Person Inserting Tube:  Maylon Peppers C, RD Tube Type:  Cortrak - 43 inches Tube Size:  10 Tube Location:  Left nare Secured by: Bridle Technique Used to Measure Tube Placement:  Marking at nare/corner of mouth Cortrak Secured At:  74 cm   Cortrak Tube Team Note:  Consult received to place a Cortrak feeding tube.   X-ray is required, abdominal x-ray has been ordered by the Cortrak team. Please confirm tube placement before using the Cortrak tube.   If the tube becomes dislodged please keep the tube and contact the Cortrak team at [email or web address] for replacement.  If after hours and replacement cannot be delayed, place a NG tube and confirm placement with an abdominal x-ray.    Lockie Pares., RD, LDN, CNSC See AMiON for contact information

## 2023-02-22 NOTE — Evaluation (Signed)
Physical Therapy Evaluation Patient Details Name: Xavier [name] MRN: [id number] DOB: 1940/11/18 Today's Date: 02/22/2023  History of Present Illness  Patient is 82 y.o. male who presented 8/9 after acute onset left-sided weakness, facial droop, and aphasia. 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 8/9 pt s/p TNK and thrombectomy and revascularization with stent in MCA M1 segment. Pt intubated for airway protection, antibiotics started for aspiration, antivirals started for COVID and flu on 8/9. Pt failed extubation on 8/11 and re-intubated. Successful extubation to [facility] on 8/13. PMH significant for DM, HTN, prostate cancer.   Clinical Impression  Xavier [name] is 82 y.o. male admitted with above HPI and diagnosis. Patient is currently limited by functional impairments below (see PT problem list). Patient lives with spouse and is independent with all mobility and ADL's at baseline. Evaluation limited due to lethargy likely secondary to medication and fatigue/weakness. Pt was able to open eyes and answer simple question with soft spoken and unintelligible speech. Pt followed simple cues inconsistently (~20% of cues) to move Ue's Rt>Lt. No movement noted in LE's this visit, PROM is WNL bil LE's at knee and hip; ankles can achieve neutral for dorsiflexion. Patient will benefit from continued skilled PT interventions to address impairments and progress independence with mobility. Acute PT will follow and progress as able.         If plan is discharge home, recommend the following: Two people to help with walking and/or transfers;Two people to help with bathing/dressing/bathroom;Assistance with cooking/housework;Assistance with feeding;Direct supervision/assist for medications management;Assist for transportation;Help with stairs or ramp for entrance;Supervision due to cognitive status   Can travel by private vehicle   No    Equipment  Recommendations  (TBD)  Recommendations for Other Services       Functional Status Assessment Patient has had a recent decline in their functional status and demonstrates the ability to make significant improvements in function in a reasonable and predictable amount of time.     Precautions / Restrictions Precautions Precautions: Fall Precaution Comments: HHFNC; NGT, flexiseal Restrictions Weight Bearing Restrictions: No      Mobility  Bed Mobility Overal bed mobility: Needs Assistance             General bed mobility comments: Total +2 assist to pull up towards long sitting to raise trunk for repositioning.    Transfers                        Ambulation/Gait                  Stairs            Wheelchair Mobility     Tilt Bed    Modified Rankin (Stroke Patients Only)       Balance                                             Pertinent Vitals/Pain Pain Assessment Pain Assessment: CPOT Facial Expression: Relaxed, neutral Body Movements: Absence of movements Muscle Tension: Relaxed Compliance with ventilator (intubated pts.): N/A Vocalization (extubated pts.): Talking in normal tone or no sound CPOT Total: 0    Home Living Family/patient expects to be discharged to:: Private residence Living Arrangements: Spouse/significant other Available Help at Discharge: Family;Available 24 hours/day Type of  Home: House Home Access: Stairs to enter   [employer] of Steps: 1   Home Layout: One level Home Equipment: None      Prior Function Prior Level of Function : Independent/Modified Independent;Driving;Working/employed                     Extremity/Trunk Assessment   Upper Extremity Assessment Upper Extremity Assessment: Right hand dominant;RUE deficits/detail;LUE deficits/detail (Per wife, R hand dominant) RUE Deficits / Details: Able to wash face with RUE with min - mod A. pt with  signifciantly decreased arousal; limited command following. did reach to face several times with RUE. Edetamous as compared to L and decreased spontaneous movemnt as compared to L. PROM WFL LUE Deficits / Details: Following ~20% of commands on L initially but decreasing with time. More spontaneous movement as compared to R. PROM WFL    Lower Extremity Assessment Lower Extremity Assessment: Defer to PT evaluation    Cervical / Trunk Assessment Cervical / Trunk Assessment: Other exceptions Cervical / Trunk Exceptions: impaired trunk control, suspect due to weakness and lethargy  Communication   Communication Communication: No apparent difficulties  Cognition Arousal: Lethargic, Obtunded, Suspect due to medications Behavior During Therapy: Flat affect Overall Cognitive Status: Difficult to assess                                 General Comments: pt oriented to self, alerted to tactil cue (sternal rub) and to constant auditory cues.        General Comments General comments (skin integrity, edema, etc.): VSS on HHFNC at 60% FiO2 30L    Exercises     Assessment/Plan    PT Assessment Patient needs continued PT services  PT Problem List Decreased strength;Decreased activity tolerance;Decreased balance;Decreased mobility;Decreased coordination;Decreased cognition;Impaired tone;Decreased knowledge of use of DME;Decreased safety awareness;Decreased knowledge of precautions;Impaired sensation;Cardiopulmonary status limiting activity       PT Treatment Interventions DME instruction;Gait training;Stair training;Functional mobility training;Therapeutic activities;Therapeutic exercise;Balance training;Neuromuscular re-education;Cognitive remediation;Patient/family education;Wheelchair mobility training;Manual techniques    PT Goals (Current goals can be found in the Care Plan section)  Acute Rehab PT Goals Patient Stated Goal: regain function PT Goal Formulation: With  family Time For Goal Achievement: 03/08/23 Potential to Achieve Goals: Good    Frequency Min 1X/week     Co-evaluation               AM-PAC PT "6 Clicks" Mobility  Outcome Measure Help needed turning from your back to your side while in a flat bed without using bedrails?: Total Help needed moving from lying on your back to sitting on the side of a flat bed without using bedrails?: Total Help needed moving to and from a bed to a chair (including a wheelchair)?: Total Help needed standing up from a chair using your arms (e.g., wheelchair or bedside chair)?: Total Help needed to walk in hospital room?: Total Help needed climbing 3-5 steps with a railing? : Total 6 Click Score: 6    End of Session Equipment Utilized During Treatment: Oxygen Activity Tolerance: Patient limited by lethargy Patient left: in bed;with call bell/phone within reach;with bed alarm set;with family/visitor present Nurse Communication: Mobility status PT Visit Diagnosis: Other abnormalities of gait and mobility (R26.89);Muscle weakness (generalized) (M62.81);Difficulty in walking, not elsewhere classified (R26.2);Other symptoms and signs involving the nervous system (Y30.160)      02/22/23 1344  PT Time Calculation  PT Start Time (ACUTE  ONLY) 1324  PT Stop Time (ACUTE ONLY) 1348  PT Time Calculation (min) (ACUTE ONLY) 24 min  PT General Charges  $$ ACUTE PT VISIT 1 Visit  PT Evaluation  $PT Eval Moderate Complexity 1 Mod        Wynn Maudlin, DPT Acute Rehabilitation Services Office [phone]  02/22/23 5:45 PM

## 2023-02-22 NOTE — Progress Notes (Signed)
[facility] Progress Note   82 y.o. male DM2, prostate cancer, HTN presenting after a syncopal episode noted with foaming at the mouth, drowsiness and hypersomnia progressing over the past week. After the clinic visit he became diaphoretic  and noted to have a left facial droop + LOC. In the ED he was noted to have left sided weakness and aphasia w/ desaturations to the 70's as well. CTA on 8/9 w/ 75ml of Iohexol showed a right M1 occlusion leading to TNKase, Iohexol x2 as well later that evening on 8/9 with mechanical thrombectomy of right M1 and right P2 with stenting and PTA required. Patient has also had hypotensive episodes requiring Levophed on 8/11-8/12 but actually became very hypertensive after extubation with systolic ~220's.    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 Currently the acute component of renal failure is likely from contrast induced nephropathy. U/S showed mild caliectasis on the right side but no e/o hydronephrosis.  - Already d/w spouse and patient did not want to be on dialysis but likely was referring to chronic standard three times per week outpt dialysis treatments. She stated that they do want dialysis in the short term period if needed to hopefully allow his renal function to recover.  - Large amount of UOP over night and likely in polyuric phase of ATN; renal function stable.  - Agree with stopping the Lasix.. - Will monitor the patient closely with you and intervene or adjust therapy as indicated by changes in clinical status/labs   -Maintain MAP>65 for optimal renal perfusion.  - Avoid nephrotoxic medications including NSAIDs and iodinated intravenous contrast exposure unless the latter is absolutely indicated.   - Preferred narcotic agents for pain control are hydromorphone, fentanyl, and methadone. Morphine should not be used.  - Avoid Baclofen and avoid oral  sodium phosphate and magnesium citrate based laxatives / bowel preps.  - Continue strict Input and Output monitoring.     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 Spouse bedside as well. Aspiration PNA with hypotension requiring Levophed but currently hypertensive requiring Cleviprex after extubation on broad spectrum abx. Hypertension - back on Cleviprex after extubation; for the 1st 24hours would keep SBP ~120-160 and then can targe 110-150 following. His BP has been very labile and Cleviprex may even be titrated down / off if he is given sedation; appears very agitated. DM2 per primary team.  Subjective:   Large UOP over past 24hrs, slight reddish tinge. Off Cleviprex.   Objective:   BP (!) 144/60   Pulse (!) 101   Temp 100.2 F (37.9 C) (Axillary)   Resp (!) 31   Ht 6\' 2"  (1.88 m)   Wt 81.1 kg   SpO2 93%   BMI 22.96 kg/m   Intake/Output Summary (Last 24 hours) at 02/22/2023 0950 Last data filed at 02/22/2023 0800 Gross per 24 hour  Intake 2374.56 ml  Output 9675 ml  Net -7300.44 ml   Weight change: -6.1 kg  Physical Exam: General appearance: not following commands closely (just gotten dilaudid) Head: NCAT Back: No CVA tenderness. Resp: rhonchi bibasilar but no rales Cardio: S1, S2 normal GI: SNDNT+BS Extremities: edema none Pulses: 2+ and symmetric Skin: Skin color, texture, turgor normal. No rashes or lesions  Imaging: IR PATIENT EVAL TECH 0-60 MINS  Result Date: 02/21/2023 Earlie Server  L, RT     02/21/2023  1:49 PM See progress note. JB   US RENAL  Result Date: 02/20/2023 CLINICAL DATA:  Acute kidney injury EXAM: RENAL / URINARY TRACT ULTRASOUND COMPLETE COMPARISON:  CT abdomen and pelvis dated November 02, 2021 FINDINGS: Right Kidney: Renal measurements: 11.8 x 5.7 x 5.2 cm = volume: 182 mL. Echogenicity within normal limits. No mass visualized. Simple appearing right renal  sinus cyst measuring 7.2 x 3.8 x 3 5 cm. Mild right caliectasis. Left Kidney: Renal measurements: 10.4 x 5.0 x 4.4 cm = volume: 121 mL. Echogenicity within normal limits. No mass or hydronephrosis visualized. Simple appearing cyst of the interpolar region of the left kidney measuring 2.6 x 2.7 x 3.1 cm. Simple appearing cyst of the upper pole of the left kidney measuring 2.6 x 2.7 x 3.1 cm. Bladder: Bladder is decompressed. Other: None. IMPRESSION: 1. Mild right caliectasis, similar to prior CT. 2. No evidence of left hydronephrosis. 3. Simple appearing bilateral renal cysts. Electronically Signed   By: Allegra Lai M.D.   On: 02/20/2023 16:59    Labs: BMET Recent Labs  Lab 02/18/23 0344 02/19/23 0301 02/19/23 1802 02/20/23 0032 02/21/23 0013 02/21/23 0024 02/21/23 0045 02/21/23 0414 02/21/23 1001 02/21/23 1754 02/22/23 0615  NA 133* 135   < > 139 135 134* 136 134* 141 140 143  K 4.4 4.4   < > 3.8 5.2* 5.2* 5.0 5.2* 4.8 4.0 3.8  CL 105 106  --  105 105 104  --  105 105 107 105  CO2 18* 20*  --  17* 18* 17*  --  18* 19* 20* 22  GLUCOSE 244* 195*  --  73 434* 426*  --  334* 144* 94 113*  BUN 28* 47*  --  58* 89* 88*  --  88* 92* 91* 96*  CREATININE 1.98* 3.03*  --  3.40* 3.87* 3.88*  --  3.81* 3.86* 3.58* 3.38*  CALCIUM 7.8* 7.8*  --  8.1* 8.0* 8.0*  --  7.8* 8.8* 9.0 9.3  PHOS 2.3* 3.4  --   --   --   --   --   --   --   --   --    < > = values in this interval not displayed.   CBC Recent Labs  Lab 02/17/23 1550 02/17/23 1650 02/18/23 0344 02/19/23 0301 02/19/23 1802 02/20/23 0029 02/20/23 0032 02/21/23 0045 02/22/23 0615  WBC 8.6  --  3.6* 7.8  --   --  10.4  --  13.1*  NEUTROABS 5.8  --   --   --   --   --   --   --   --   HGB 12.9*   < > 11.4* 9.8*   < > 8.8* 9.5* 8.2* 9.7*  HCT 40.3   < > 35.8* 30.0*   < > 26.0* 28.7* 24.0* 29.4*  MCV 83.6  --  82.1 82.4  --   --  81.3  --  80.3  PLT 220  --  201 144*  --   --  151  --  202   < > = values in this interval not  displayed.    Medications:     amLODipine  10 mg Per Tube Daily   atorvastatin  80 mg Per Tube Daily   Chlorhexidine Gluconate Cloth  6 each Topical Q0600   docusate  100 mg Per Tube BID   free water  75 mL Per Tube Q4H  insulin aspart  0-20 Units Subcutaneous Q4H   mouth rinse  15 mL Mouth Rinse 4 times per day   potassium chloride  40 mEq Oral Once   QUEtiapine  25 mg Per Tube QHS   ticagrelor  60 mg Per Tube BID      Paulene Floor, MD 02/22/2023, 9:50 AM

## 2023-02-22 NOTE — Progress Notes (Signed)
NAME:  Xavier [name], MRN:  [id number], DOB:  Mar 10, 1941, LOS: 5 ADMISSION DATE:  02/17/2023  History of Present Illness:  82 year old male with hypertension, diabetes, CKD 3B, who presented after acute onset left-sided weakness, facial droop, and aphasia.  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  Tenecteplase administered.  He was intubated for airway protection.  He underwent revascularization and stenting of occluded right MCA and revascularization of  occluded right PCA without stenting.  Notably, he was diagnosed with COVID and flu on same day as strokelike symptoms occurred.  Significant Hospital Events:  8/9 admitted to MICU at [facility] after acute cerebral infarct s/p TNK and thrombectomy, intubated for airway protection, antibiotics started for aspiration, antivirals started for COVID and flu 8/11 extubated but re-intubated for respiratory failure 8/13 extubated to heated high-flow [place]  Interim History / Subjective:  Tmax 101.1 9.5 L UOP Hemoglobin drift Hypoglycemia  Objective   Blood pressure 137/67, pulse 94, temperature 98 F (36.7 C), temperature source Axillary, resp. rate (!) 25, height 6\' 2"  (1.88 m), weight 81.1 kg, SpO2 96%.    Vent Mode: PSV;CPAP FiO2 (%):  [40 %-100 %] 70 % Set Rate:  [20 bmp] 20 bmp Vt Set:  [650 mL] 650 mL PEEP:  [5 cmH20-8 cmH20] 5 cmH20 Pressure Support:  [5 cmH20] 5 cmH20 Plateau Pressure:  [15 cmH20-22 cmH20] 15 cmH20   Intake/Output Summary (Last 24 hours) at 02/22/2023 0716 Last data filed at 02/22/2023 0700 Gross per 24 hour  Intake 2496.5 ml  Output 9575 ml  Net -7078.5 ml   Filed Weights   02/20/23 0500 02/21/23 0344 02/22/23 0500  Weight: 86.3 kg 87.2 kg 81.1 kg    Examination: No apparent distress NG tube in place, high-flow [place] in place Heart normal, rhythm irregular, radial pulses strong, right upper extremity edema improving, left  foot cool compared to right with intact PT pulse put non-dopplerable DP pulse, sight of left femoral artery access clean without signs of hematoma Breathing is tachypneic, lungs clear over anterior fields Skin is warm and dry Alert to verbal, follows simple instructions, thumbs up and tongue out  Labs: K3.8  bicarb 22  magnesium 3.3 WBC 10.4 => 13.1 Stable hemoglobin and platelets  Resolved Hospital Problem list   Resolved Problems:   * No resolved hospital problems. *   Assessment & Plan:  Principal Problem:   Acute ischemic stroke [facility]) Active Problems:   Acute respiratory failure with hypoxia (HCC)   Pneumonia due to COVID-19 virus   Aspiration pneumonia of both lungs due to gastric secretions (HCC)   Influenza  Right MCA and PCA infarcts s/p TNK and revascularization with stent in MCA M1 segment - Neurology signed off, appreciate their assistance - Better neuro exam today - ticagrelor and atorvastatin - aspirin dc'd in favor of anticoagulation for afib - BP < 160  Metabolic encephalopathy - mixed with component of sedation - wean precedex to off - swallow screen today, hopeful for dc of ng tube as well  Atrial fibrillation - Newly diagnosed - Critical illness induced versus chronic paroxysmal a-fib, suspect latter given embolic cerebral infarct - Heparin infusion, transition to DOAC when more clinically stable - Rates okay now, not likely a cardioversion candidate  Aspiration pneumonia versus pneumonitis Acute hypoxic respiratory failure, improving - Continue Zosyn to complete 5 days through 8/16 - SpO2 >92% - COVID and flu negative  on repeat test  AKI on CKD IIIb, borderline nonoliguric 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 ? Post ATN diuresis - Appreciate nephrology assistance - 9.5 L UOP after furosemide challenge - dc furosemide - monitor UOP and electrolytes  Labile blood pressure - SBP < 180 mmHg - Treat to cuff pressures - Restart home  amlodipine  DM2 - SSI for now  Goals of care - guarded prognosis - appreciate palliative assistance  Best practice (daily eval):  Diet/type: tubefeeds, DC NG tube pending swallow eval DVT prophylaxis: prophylactic heparin  GI prophylaxis: N/A Lines: N/A Foley:  Yes, and it is still needed Code Status:  full code Last date of multidisciplinary goals of care discussion [02/20/23]  Marrianne Mood MD 02/22/2023, 7:16 AM  Pager: [phone]

## 2023-02-22 NOTE — Progress Notes (Signed)
Palliative Note  Visited with wife at bedside to offer support.  Barrie Folk MS Ed.S, RN Palliative Medicine Team  Team Phone: [phone]

## 2023-02-22 NOTE — Evaluation (Signed)
Clinical/Bedside Swallow Evaluation Xavier [name] Details  Name: Xavier [name] MRN: [id number] Date of Birth: May 01, 1941  Today's Date: 02/22/2023 Time: SLP Start Time (ACUTE ONLY): 1450 SLP Stop Time (ACUTE ONLY): 1505 SLP Time Calculation (min) (ACUTE ONLY): 15 min  Past Medical History:  Past Medical History:  Diagnosis Date   Congenital ureterocele    Diabetes mellitus without complication (HCC)    Hypertension    Prostate cancer (HCC)    Past Surgical History:  Past Surgical History:  Procedure Laterality Date   COLONOSCOPY W/ POLYPECTOMY     EYE SURGERY     IR ANGIO INTRA EXTRACRAN SEL COM CAROTID INNOMINATE UNI L MOD SED  02/17/2023   IR ANGIO VERTEBRAL SEL VERTEBRAL UNI L MOD SED  02/17/2023   IR CT HEAD LTD  02/17/2023   IR CT HEAD LTD  02/17/2023   IR Xavier [name] EVAL TECH 0-60 MINS  02/21/2023   IR PERCUTANEOUS ART THROMBECTOMY/INFUSION INTRACRANIAL INC DIAG ANGIO  02/17/2023   RADIOLOGY WITH ANESTHESIA N/A 02/17/2023   Procedure: IR WITH ANESTHESIA;  Surgeon: Radiologist, Medication, MD;  Location: [facility] OR;  Service: Radiology;  Laterality: N/A;   HPI:  Xavier [name] is an 82 y.o. male with PMH: type 2 diabetes mellitus, prostate cancer, and essential hypertension. He presented to the hospital on 02/17/2023 after syncopal episode with associated foaming at the mouth and facial asymmetry. Xavier [name]'s wife reported to ED staff that Xavier [name] had not been himself over past few months with significant drowsiness and hypersomnia. He was diagnosed with Covid by his PCP on afternoon of admission and while in the car on way home, left side of face began to droop and he lost consciousness and made a regurgitating sound. 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 He underwent mechanical thrombectomy on 8/9. He was intubated for procedure, extubated aftewards but had to be reintubated on 8/11 due to hypoxia. He was extubated on 8/13. Xavier [name] pulled out his Cortrak 8/14.    Assessment / Plan /  Recommendation  Clinical Impression  Xavier [name] presents with clinical s/s of dysphagia as per this bedside swallow evaluation with significant impact from both current level of lethargy as well as s/p 4 day intubation. Xavier [name] required maximal cues to initiate and maintain brief alertness. His voice is aphonic, low in intensity. His RR was in 30-35 range. He was receptive to small ice chip which he started to [facility] before stopping and requiring cues for alertness. He then accepted two small spoon sips of water, requiring cues to initiate swallow but no overt s/s aspiration. SLP recommending continue NPO status and will f/u next date to determine readiness for PO's. SLP Visit Diagnosis: Dysphagia, unspecified (R13.10)    Aspiration Risk  Severe aspiration risk    Diet Recommendation NPO    Medication Administration: Via alternative means    Other  Recommendations Oral Care Recommendations: Oral care QID;Staff/trained caregiver to provide oral care    Recommendations for follow up therapy are one component of a multi-disciplinary discharge planning process, led by the attending physician.  Recommendations may be updated based on Xavier [name] status, additional functional criteria and insurance authorization.  Follow up Recommendations Other (comment) (TBD)      Assistance Recommended at Discharge    Functional Status Assessment Xavier [name] has had a recent decline in their functional status and demonstrates the ability to make significant improvements in function in a reasonable and predictable amount of time.  Frequency and Duration min 2x/week  2 weeks  Prognosis Prognosis for improved oropharyngeal function: Good      Swallow Study   General Date of Onset: 02/17/23 HPI: Xavier [name] is an 82 y.o. male with PMH: type 2 diabetes mellitus, prostate cancer, and essential hypertension. He presented to the hospital on 02/17/2023 after syncopal episode with associated foaming at the mouth and  facial asymmetry. Xavier [name]'s wife reported to ED staff that Xavier [name] had not been himself over past few months with significant drowsiness and hypersomnia. He was diagnosed with Covid by his PCP on afternoon of admission and while in the car on way home, left side of face began to droop and he lost consciousness and made a regurgitating sound. 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 He underwent mechanical thrombectomy on 8/9. He was intubated for procedure, extubated aftewards but had to be reintubated on 8/11 due to hypoxia. He was extubated on 8/13. Xavier [name] pulled out his Cortrak 8/14. Type of Study: Bedside Swallow Evaluation Previous Swallow Assessment: none  found Diet Prior to this Study: NPO Temperature Spikes Noted: No Respiratory Status: Nasal cannula History of Recent Intubation: Yes Total duration of intubation (days): 4 days Date extubated: 02/21/23 Behavior/Cognition: Lethargic/Drowsy;Requires cueing Oral Cavity Assessment: Within Functional Limits Oral Care Completed by SLP: Yes Oral Cavity - Dentition: Missing dentition Self-Feeding Abilities: Total assist Xavier [name] Positioning: Upright in bed Baseline Vocal Quality: Aphonic;Hoarse Volitional Cough: Weak Volitional Swallow: Unable to elicit    Oral/Motor/Sensory Function Overall Oral Motor/Sensory Function: Other (comment) (unable to participate)   Ice Chips Ice chips: Impaired Oral Phase Impairments: Poor awareness of bolus Oral Phase Functional Implications: Oral holding   Thin Liquid Thin Liquid: Impaired Presentation: Spoon Oral Phase Impairments: Poor awareness of bolus Pharyngeal  Phase Impairments: Suspected delayed Swallow    Nectar Thick Nectar Thick Liquid: Not tested   Honey Thick Honey Thick Liquid: Not tested   Puree Puree: Not tested   Solid     Solid: Not tested     Angela Nevin, MA, CCC-SLP Speech Therapy

## 2023-02-22 NOTE — Progress Notes (Signed)
Brief Nutrition Support Note  Pt with NGT in place and continued need for enteral feeds. Discussed with MD yesterday, tube to be exchanged for cortrak for higher level of comfort and more secure placement with bridle.   Cortrak placed and terminates in the distal stomach  Resume the following TF formula: Glucerna 1.5 at 55 ml/h (1320 ml per day) Free water 75mL q4h Provides 1980 kcal, 109 gm protein, 1002 ml free water daily ( TF+flush)  Greig Castilla, RD, LDN Clinical Dietitian RD pager # available in [facility]  After hours/weekend pager # available in [facility]

## 2023-02-22 NOTE — Progress Notes (Signed)
Lower extremity venous bilateral study completed.   Please see CV Proc for preliminary results.   Rachel Hodge, RDMS, RVT  

## 2023-02-22 NOTE — Evaluation (Signed)
Occupational Therapy Evaluation Patient Details Name: Xavier [name] MRN: [id number] DOB: 1940-10-26 Today's Date: 02/22/2023   History of Present Illness Patient is 82 y.o. male who presented 8/9 after acute onset left-sided weakness, facial droop, and aphasia. 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 8/9 pt s/p  TNK and thrombectomy and revascularization with stent in MCA M1 segment. Pt intubated for airway protection, antibiotics started for aspiration, antivirals started for COVID and flu on 8/9. Pt failed extubation on 8/11 and re-intubated. Successful extubation to [facility] on 8/13. PMH significant for DM, HTN, prostate cancer.   Clinical Impression   PTA, pt lived with wife and was independent. Eval limited by pt lethargy and need for constant multimodal cues to maintain short periods of arousal, thus, be level eval. Pt following ~20% of commands on L side initially with max cues. Pt able to wash face with RUE with min-mod A to initiate and sustain task. Answering basic questions but very soft spoken and difficult to understand. Oriented to self and wife. Will continue to follow and Patient will benefit from continued inpatient follow up therapy, <3 hours/day.       If plan is discharge home, recommend the following: Two people to help with walking and/or transfers;Two people to help with bathing/dressing/bathroom;Assistance with cooking/housework;Direct supervision/assist for medications management;Direct supervision/assist for financial management;Assist for transportation;Help with stairs or ramp for entrance    Functional Status Assessment  Patient has had a recent decline in their functional status and demonstrates the ability to make significant improvements in function in a reasonable and predictable amount of time.  Equipment Recommendations  Other (comment) (defer)    Recommendations for Other Services        Precautions / Restrictions Precautions Precautions: Fall;Other (comment) Precaution Comments: HHFNC Restrictions Weight Bearing Restrictions: No (Simultaneous filing. User may not have seen previous data.)      Mobility Bed Mobility Overal bed mobility: Needs Assistance             General bed mobility comments: Total A to pull up into long sit in bed with +2 assist    Transfers                   General transfer comment: deferred due to level of arousal.      Balance                                           ADL either performed or assessed with clinical judgement   ADL Overall ADL's : Needs assistance/impaired     Grooming: Wash/dry face;Minimal assistance;Bed level;Moderate assistance Grooming Details (indicate cue type and reason): Min A to initiate and then support at eblow to wash mouth. Mod A for thoroughness for remainter of face.                               General ADL Comments: Washing face and spontaneously moving arms as well as intermittently moving arms on command. Otherwise total A due to low level of arousal.     Vision Ability to See in Adequate Light: 0 Adequate Patient Visual Report: Other (comment) (Pt unable to report) Additional Comments: PT locating therapist on L and R on command with increased time and cues. Not formally  assessed due to eyes closed >90% of session today     Perception         Praxis         Pertinent Vitals/Pain Pain Assessment Pain Assessment: CPOT Facial Expression: Relaxed, neutral (Simultaneous filing. User may not have seen previous data.) Body Movements: Absence of movements (Simultaneous filing. User may not have seen previous data.) Muscle Tension: Relaxed (Simultaneous filing. User may not have seen previous data.) Compliance with ventilator (intubated pts.): N/A (Simultaneous filing. User may not have seen previous data.) Vocalization (extubated pts.): Talking in  normal tone or no sound (Simultaneous filing. User may not have seen previous data.) CPOT Total: 0 (Simultaneous filing. User may not have seen previous data.)     Extremity/Trunk Assessment Upper Extremity Assessment Upper Extremity Assessment: Right hand dominant;RUE deficits/detail;LUE deficits/detail (Per wife, R hand dominant) RUE Deficits / Details: Able to wash face with RUE with min - mod A. pt with signifciantly decreased arousal; limited command following. did reach to face several times with RUE. Edetamous as compared to L and decreased spontaneous movemnt as compared to L. PROM WFL LUE Deficits / Details: Following ~20% of commands on L initially but decreasing with time. More spontaneous movement as compared to R. PROM WFL   Lower Extremity Assessment Lower Extremity Assessment: Defer to PT evaluation       Communication Communication Communication: No apparent difficulties   Cognition Arousal: Lethargic, Suspect due to medications, Obtunded Behavior During Therapy: Flat affect Overall Cognitive Status: Difficult to assess                                 General Comments: Oriented to self; otherwise difficulty to understand when speaking due to pt with extremely soft voice.     General Comments  VSS on HHFNC at 60% FiO2 30L    Exercises     Shoulder Instructions      Home Living Family/patient expects to be discharged to:: Private residence Living Arrangements: Spouse/significant other Available Help at Discharge: Family;Available 24 hours/day Type of Home: House Home Access: Stairs to enter [employer] of Steps: 1   Home Layout: One level     Bathroom Shower/Tub: Chief Strategy Officer: Standard Bathroom Accessibility: Yes   Home Equipment: None          Prior Functioning/Environment Prior Level of Function : Independent/Modified Independent;Driving;Working/employed                        OT Problem  List: Decreased strength;Impaired balance (sitting and/or standing);Decreased activity tolerance;Decreased cognition;Impaired UE functional use      OT Treatment/Interventions: Self-care/ADL training;Therapeutic exercise;DME and/or AE instruction;Balance training;Patient/family education;Therapeutic activities;Cognitive remediation/compensation    OT Goals(Current goals can be found in the care plan section) Acute Rehab OT Goals Patient Stated Goal: unable OT Goal Formulation: With patient Time For Goal Achievement: 03/08/23 Potential to Achieve Goals: Good  OT Frequency: Min 2X/week    Co-evaluation              AM-PAC OT "6 Clicks" Daily Activity     Outcome Measure Help from another person eating meals?: Total Help from another person taking care of personal grooming?: A Lot Help from another person toileting, which includes using toliet, bedpan, or urinal?: Total Help from another person bathing (including washing, rinsing, drying)?: Total Help from another person to put on and taking off regular upper  body clothing?: Total Help from another person to put on and taking off regular lower body clothing?: Total 6 Click Score: 7   End of Session Equipment Utilized During Treatment: Oxygen Nurse Communication: Mobility status;Precautions  Activity Tolerance: Patient tolerated treatment well Patient left: in bed;with bed alarm set;with family/visitor present  OT Visit Diagnosis: Unsteadiness on feet (R26.81);Muscle weakness (generalized) (M62.81);Other symptoms and signs involving cognitive function                Time: 1610-9604 OT Time Calculation (min): 24 min Charges:  OT General Charges $OT Visit: 1 Visit OT Evaluation $OT Eval Moderate Complexity: 1 Mod  Tyler Deis, OTR/L [facility] Acute Rehabilitation Office: [phone]   Myrla Halsted 02/22/2023, 4:38 PM

## 2023-02-22 NOTE — Progress Notes (Addendum)
Stable today. Spontaneously converted back to sinus. Having some tachypnea but maintaining O2 saturation on heated high-flow [place]. Having gross hematuria. Chief concern is rectal pain. Tmax of 101.1 F.  BP (!) 155/57   Pulse (!) 112   Temp (!) 101.1 F (38.4 C) (Axillary)   Resp (!) 40   Ht 6\' 2"  (1.88 m)   Wt 81.1 kg   SpO2 95%   BMI 22.96 kg/m   Uncomfortable appearing, in soft wrist restraints NG tube in place Normal heart rate and rhythm Tachypnea, shallow breaths, lungs clear Skin warm and dry Rectal tube in place with brown stool Foley catheter draining pink urine No calf tenderness or swelling Alert and answers questions, follows instructions  Cerebral infarct Encephalopathy Aspiration pneumonia versus pneumonitis Respiratory failure Paroxysmal atrial fibrillation Hematuria  - continuing to wean dexmedetomidine to off, try to DC restraints - quetiapine qhs - pip/tazo - heated high-flow [place] for SpO2 > 92% - heparin infusion - ticagrelor - trend CBC - schedule acetaminophen q6 h - DC Foley in favor of external cath, follow UOP closely and bladder scan if concerned about obstruction from clot - DC rectal tube - bilateral LE venous duplex

## 2023-02-22 NOTE — Progress Notes (Signed)
Interventional Radiology Brief Note:  Chart check of patient status after recent R MCA stent placement. R groin sheath removal yesterday by IR staff. Note patient has again been extubated, however now with episodes of a fib and difficulty clearing secretions.  Also with likely ICU-related delirium.  He has been transitioned from Brilinta 90mg  BID and aspirin 81 mg daily to Brilinta with IV heparin infusion.  Dr. Corliss Skains made aware, no objection to current regimen.   Patient will likely need additional stent placement at a future date.  Ideally would be able to follow-up 2 weeks after discharge pending clinical course.  Schedulers aware.  Will monitor.   Loyce Dys, MS RD PA-C

## 2023-02-22 NOTE — Progress Notes (Addendum)
eLink Physician-Brief Progress Note Patient Name: Xavier [name] DOB: [birth year] MRN: [id number]   Date of Service  02/22/2023  HPI/Events of Note  Patient needs restraints order renewed to prevent removal of lines and tubes.  eICU Interventions  Order renewed.        Migdalia Dk 02/22/2023, 10:52 PM

## 2023-02-22 NOTE — Inpatient Diabetes Management (Signed)
Inpatient Diabetes Program Recommendations  AACE/ADA: New Consensus Statement on Inpatient Glycemic Control (2015)  Target Ranges:  Prepandial:   less than 140 mg/dL      Peak postprandial:   less than 180 mg/dL (1-2 hours)      Critically ill patients:  140 - 180 mg/dL   Lab Results  Component Value Date   GLUCAP 141 (H) 02/22/2023   HGBA1C 9.0 (H) 02/17/2023    Review of Glycemic Control  Latest Reference Range & Units 02/21/23 09:32 02/21/23 10:40 02/21/23 11:25 02/21/23 14:05 02/21/23 15:12 02/21/23 17:24 02/21/23 19:32 02/21/23 20:12 02/21/23 23:30 02/22/23 03:39 02/22/23 07:39  Glucose-Capillary 70 - 99 mg/dL 829 (H) 562 (H) 130 (H) 116 (H) 107 (H) 93 65 (L) 114 (H) 94 102 (H) 141 (H)   Diabetes history: DM 2 Outpatient Diabetes medications:  Farxiga 10 mg daily, Toujeo 60 units daily Current orders for Inpatient glycemic control:  Novolog 0-20 units q 4 hours  Glucerna 55 ml/hour  Inpatient Diabetes Program Recommendations:    Note pt received Semglee 45 units yesterday. Glucose 65 yesterday evening. Also renal function is elevated  -   Reduce Semglee to 40 units and give today, currently there is no basal insulin ordered -   Reduce Novolog Correction to 0-9 units Q4 hours  Add Novolog Tube Feed Coverage Q4 hours if needed after the adjustments  Thanks,  Christena Deem RN, MSN, BC-ADM Inpatient Diabetes Coordinator Team Pager [phone] (8a-5p)

## 2023-02-23 ENCOUNTER — Inpatient Hospital Stay (HOSPITAL_COMMUNITY): Payer: PPO

## 2023-02-23 DIAGNOSIS — I639 Cerebral infarction, unspecified: Secondary | ICD-10-CM | POA: Diagnosis not present

## 2023-02-23 DIAGNOSIS — J9601 Acute respiratory failure with hypoxia: Secondary | ICD-10-CM | POA: Diagnosis not present

## 2023-02-23 LAB — HEPARIN LEVEL (UNFRACTIONATED): Heparin Unfractionated: 0.23 [IU]/mL — ABNORMAL LOW (ref 0.30–0.70)

## 2023-02-23 LAB — RENAL FUNCTION PANEL
Albumin: 3 g/dL — ABNORMAL LOW (ref 3.5–5.0)
Anion gap: 16 — ABNORMAL HIGH (ref 5–15)
BUN: 97 mg/dL — ABNORMAL HIGH (ref 8–23)
CO2: 22 mmol/L (ref 22–32)
Calcium: 8.5 mg/dL — ABNORMAL LOW (ref 8.9–10.3)
Chloride: 107 mmol/L (ref 98–111)
Creatinine, Ser: 3.3 mg/dL — ABNORMAL HIGH (ref 0.61–1.24)
GFR, Estimated: 18 mL/min — ABNORMAL LOW (ref >60)
Glucose, Bld: 249 mg/dL — ABNORMAL HIGH (ref 70–99)
Phosphorus: 4.7 mg/dL — ABNORMAL HIGH (ref 2.5–4.6)
Potassium: 4.1 mmol/L (ref 3.5–5.1)
Sodium: 145 mmol/L (ref 135–145)

## 2023-02-23 LAB — CBC
HCT: 28 % — ABNORMAL LOW (ref 39.0–52.0)
Hemoglobin: 9 g/dL — ABNORMAL LOW (ref 13.0–17.0)
MCH: 26.6 pg (ref 26.0–34.0)
MCHC: 32.1 g/dL (ref 30.0–36.0)
MCV: 82.8 fL (ref 80.0–100.0)
Platelets: 188 10*3/uL (ref 150–400)
RBC: 3.38 MIL/uL — ABNORMAL LOW (ref 4.22–5.81)
RDW: 14.9 % (ref 11.5–15.5)
WBC: 9.7 10*3/uL (ref 4.0–10.5)
nRBC: 0 % (ref 0.0–0.2)

## 2023-02-23 LAB — GLUCOSE, CAPILLARY
Glucose-Capillary: 158 mg/dL — ABNORMAL HIGH (ref 70–99)
Glucose-Capillary: 186 mg/dL — ABNORMAL HIGH (ref 70–99)
Glucose-Capillary: 201 mg/dL — ABNORMAL HIGH (ref 70–99)
Glucose-Capillary: 220 mg/dL — ABNORMAL HIGH (ref 70–99)
Glucose-Capillary: 206 mg/dL — ABNORMAL HIGH (ref 70–99)
Glucose-Capillary: 254 mg/dL — ABNORMAL HIGH (ref 70–99)

## 2023-02-23 LAB — MAGNESIUM: Magnesium: 3.2 mg/dL — ABNORMAL HIGH (ref 1.7–2.4)

## 2023-02-23 MED ORDER — INSULIN ASPART 100 UNIT/ML IJ SOLN
3.0000 [IU] | INTRAMUSCULAR | Status: DC
  Administered 2023-02-23 – 2023-02-24 (×4): 3 [IU] via SUBCUTANEOUS

## 2023-02-23 MED ORDER — TAMSULOSIN HCL 0.4 MG PO CAPS: 0.4000 mg | ORAL_CAPSULE | Freq: Every day | ORAL | Status: DC

## 2023-02-23 MED ORDER — DOXAZOSIN MESYLATE 1 MG PO TABS
1.0000 mg | ORAL_TABLET | Freq: Every day | ORAL | Status: DC
  Administered 2023-02-24 – 2023-02-27 (×4): 1 mg
  Filled 2023-02-23 (×4): qty 1

## 2023-02-23 MED ORDER — DOXAZOSIN MESYLATE 1 MG PO TABS
1.0000 mg | ORAL_TABLET | Freq: Every day | ORAL | Status: DC
  Administered 2023-02-23: 1 mg via ORAL
  Filled 2023-02-23: qty 1

## 2023-02-23 MED ORDER — DILTIAZEM LOAD VIA INFUSION
20.0000 mg | Freq: Once | INTRAVENOUS | Status: AC
  Administered 2023-02-23: 20 mg via INTRAVENOUS
  Filled 2023-02-23: qty 20

## 2023-02-23 MED ORDER — APIXABAN 2.5 MG PO TABS: 2.5000 mg | ORAL_TABLET | Freq: Two times a day (BID) | ORAL | Status: DC

## 2023-02-23 MED ORDER — APIXABAN 2.5 MG PO TABS
2.5000 mg | ORAL_TABLET | Freq: Two times a day (BID) | ORAL | Status: DC
  Administered 2023-02-23 – 2023-03-01 (×13): 2.5 mg
  Filled 2023-02-23 (×13): qty 1

## 2023-02-23 MED ORDER — DILTIAZEM HCL-DEXTROSE 125-5 MG/125ML-% IV SOLN (PREMIX)
5.0000 mg/h | INTRAVENOUS | Status: DC
  Administered 2023-02-23: 5 mg/h via INTRAVENOUS
  Filled 2023-02-23 (×2): qty 125

## 2023-02-23 NOTE — Progress Notes (Signed)
[facility] Progress Note   82 y.o. male DM2, prostate cancer, HTN presenting after a syncopal episode noted with foaming at the mouth, drowsiness and hypersomnia progressing over the past week. After the clinic visit he became diaphoretic  and noted to have a left facial droop + LOC. In the ED he was noted to have left sided weakness and aphasia w/ desaturations to the 70's as well. CTA on 8/9 w/ 75ml of Iohexol showed a right M1 occlusion leading to TNKase, Iohexol x2 as well later that evening on 8/9 with mechanical thrombectomy of right M1 and right P2 with stenting and PTA required. Patient has also had hypotensive episodes requiring Levophed on 8/11-8/12 but actually became very hypertensive after extubation with systolic ~220's.    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 Currently the acute component of renal failure is likely from contrast induced nephropathy. U/S showed mild caliectasis on the right side but no e/o hydronephrosis.  - Already d/w spouse and patient did not want to be on dialysis but likely was referring to chronic standard three times per week outpt dialysis treatments. She stated that they do want dialysis in the short term period if needed to hopefully allow his renal function to recover.  - Large amount of UOP over night and likely in polyuric phase of ATN; renal function stable. Per RN requiring in and out cath (~751ml each time). - Off Lasix.  - Will monitor the patient closely with you and intervene or adjust therapy as indicated by changes in clinical status/labs  -Maintain MAP>65 for optimal renal perfusion.  - Avoid nephrotoxic medications including NSAIDs and iodinated intravenous contrast exposure unless the latter is absolutely indicated.   - Preferred narcotic agents for pain control are hydromorphone, fentanyl, and methadone. Morphine should not be used.  -  Avoid Baclofen and avoid oral sodium phosphate and magnesium citrate based laxatives / bowel preps.  - Continue strict Input and Output monitoring.     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 Spouse bedside as well. Aspiration PNA with hypotension requiring Levophed but currently hypertensive requiring Cleviprex after extubation on broad spectrum abx. Hypertension - back on Cleviprex after extubation; for the 1st 24hours would keep SBP ~120-160 and then can targe 110-150 following. His BP has been very labile and Cleviprex may even be titrated down / off if he is given sedation; appears very agitated. DM2 per primary team.  Subjective:   Large UOP over past 24hrs, had a clot but since then clear Off Cleviprex.   Objective:   BP (!) 162/50   Pulse 60   Temp 98.3 F (36.8 C) (Oral)   Resp (!) 25   Ht 6\' 2"  (1.88 m)   Wt 81.1 kg   SpO2 100%   BMI 22.96 kg/m   Intake/Output Summary (Last 24 hours) at 02/23/2023 0949 Last data filed at 02/23/2023 0800 Gross per 24 hour  Intake 2075.73 ml  Output 3750 ml  Net -1674.27 ml   Weight change: 0 kg  Physical Exam: General appearance: just received sedation Head: NCAT Back: No CVA tenderness. Resp: rhonchi bibasilar but no rales Cardio: S1, S2 normal GI: SNDNT+BS Extremities: edema none Pulses: 2+ and symmetric Skin: Skin color, texture, turgor normal. No rashes or lesions  Imaging: VAS [place] LOWER EXTREMITY VENOUS (DVT)  Result Date: 02/23/2023  Lower Venous DVT Study Patient Name:  CLAUD ISHEE  Date of Exam:   02/22/2023 Medical Rec #: [id number]         Accession #:    [id number] Date of Birth: [birth year]         Patient Gender: M Patient Age:   69 years Exam Location:  [facility] Procedure:      VAS [place] LOWER EXTREMITY VENOUS (DVT) Referring Phys: Levon Hedger  --------------------------------------------------------------------------------  Indications: Fever.  Comparison Study: No prior studies. Performing Technologist: Jean Rosenthal RDMS, RVT  Examination Guidelines: A complete evaluation includes B-mode imaging, spectral Doppler, color Doppler, and power Doppler as needed of all accessible portions of each vessel. Bilateral testing is considered an integral part of a complete examination. Limited examinations for reoccurring indications may be performed as noted. The reflux portion of the exam is performed with the patient in reverse Trendelenburg.  +---------+---------------+---------+-----------+----------+--------------+ RIGHT    CompressibilityPhasicitySpontaneityPropertiesThrombus Aging +---------+---------------+---------+-----------+----------+--------------+ CFV      Full           Yes      Yes                                 +---------+---------------+---------+-----------+----------+--------------+ SFJ      Full                                                        +---------+---------------+---------+-----------+----------+--------------+ FV Prox  Full                                                        +---------+---------------+---------+-----------+----------+--------------+ FV Mid   Full                                                        +---------+---------------+---------+-----------+----------+--------------+ FV DistalFull                                                        +---------+---------------+---------+-----------+----------+--------------+ PFV      Full                                                        +---------+---------------+---------+-----------+----------+--------------+ POP      Full           Yes      Yes                                 +---------+---------------+---------+-----------+----------+--------------+ PTV      Full                                                         +---------+---------------+---------+-----------+----------+--------------+  PERO     Full                                                        +---------+---------------+---------+-----------+----------+--------------+   +---------+---------------+---------+-----------+----------+--------------+ LEFT     CompressibilityPhasicitySpontaneityPropertiesThrombus Aging +---------+---------------+---------+-----------+----------+--------------+ CFV      Full           Yes      Yes                                 +---------+---------------+---------+-----------+----------+--------------+ SFJ      Full                                                        +---------+---------------+---------+-----------+----------+--------------+ FV Prox  Full                                                        +---------+---------------+---------+-----------+----------+--------------+ FV Mid   Full                                                        +---------+---------------+---------+-----------+----------+--------------+ FV DistalFull                                                        +---------+---------------+---------+-----------+----------+--------------+ PFV      Full                                                        +---------+---------------+---------+-----------+----------+--------------+ POP      Full           Yes      Yes                                 +---------+---------------+---------+-----------+----------+--------------+ PTV      Full                                                        +---------+---------------+---------+-----------+----------+--------------+ PERO     Full                                                        +---------+---------------+---------+-----------+----------+--------------+  Summary: RIGHT: - There is no evidence of deep vein thrombosis in the lower extremity.  - No  cystic structure found in the popliteal fossa. - Heavy arterial calcification noted.  LEFT: - There is no evidence of deep vein thrombosis in the lower extremity.  - No cystic structure found in the popliteal fossa. - Heavy arterial calcification noted.  *See table(s) above for measurements and observations. Electronically signed by Sherald Hess MD on 02/23/2023 at 9:49:27 AM.    Final    DG Abd Portable 1V  Result Date: 02/22/2023 CLINICAL DATA:  Feeding tube placement. EXAM: PORTABLE ABDOMEN - 1 VIEW COMPARISON:  One-view abdomen 02/22/2023 at 9:10 a.m. FINDINGS: Heart is enlarged. Asymmetric right-sided airspace disease is again seen. A small bore feeding tube terminates in the fundus of the stomach. IMPRESSION: 1. Small bore feeding tube terminates in the fundus of the stomach. 2. Asymmetric right-sided airspace disease. Electronically Signed   By: Marin Roberts M.D.   On: 02/22/2023 16:19   DG Abd Portable 1V  Result Date: 02/22/2023 CLINICAL DATA:  NG tube placement EXAM: PORTABLE ABDOMEN - 1 VIEW COMPARISON:  KUB 3 days prior FINDINGS: The enteric catheter tip is in the distal stomach. There is mild gaseous distention of the stomach and large bowel in the imaged upper abdomen. There is no definite free intraperitoneal air. Extensive airspace disease is again seen in the right lower lobe with a probable layering pleural effusion. IMPRESSION: 1. Enteric catheter tip in the distal stomach. 2. Extensive airspace disease again seen in the right lower lobe with a probable layering pleural effusion. Electronically Signed   By: Lesia Hausen M.D.   On: 02/22/2023 10:48   IR PATIENT EVAL TECH 0-60 MINS  Result Date: 02/21/2023 Acey Lav, RT     02/21/2023  1:49 PM See progress note. JB    Labs: BMET Recent Labs  Lab 02/18/23 0344 02/19/23 0301 02/19/23 1802 02/21/23 0013 02/21/23 0024 02/21/23 0045 02/21/23 0414 02/21/23 1001 02/21/23 1754 02/22/23 0615 02/23/23 0051  NA  133* 135   < > 135 134* 136 134* 141 140 143 145  K 4.4 4.4   < > 5.2* 5.2* 5.0 5.2* 4.8 4.0 3.8 4.1  CL 105 106   < > 105 104  --  105 105 107 105 107  CO2 18* 20*   < > 18* 17*  --  18* 19* 20* 22 22  GLUCOSE 244* 195*   < > 434* 426*  --  334* 144* 94 113* 249*  BUN 28* 47*   < > 89* 88*  --  88* 92* 91* 96* 97*  CREATININE 1.98* 3.03*   < > 3.87* 3.88*  --  3.81* 3.86* 3.58* 3.38* 3.30*  CALCIUM 7.8* 7.8*   < > 8.0* 8.0*  --  7.8* 8.8* 9.0 9.3 8.5*  PHOS 2.3* 3.4  --   --   --   --   --   --   --   --  4.7*   < > = values in this interval not displayed.   CBC Recent Labs  Lab 02/17/23 1550 02/17/23 1650 02/19/23 0301 02/19/23 1802 02/20/23 0032 02/21/23 0045 02/22/23 0615 02/23/23 0051  WBC 8.6   < > 7.8  --  10.4  --  13.1* 9.7  NEUTROABS 5.8  --   --   --   --   --   --   --   HGB 12.9*   < > 9.8*   < >  9.5* 8.2* 9.7* 9.0*  HCT 40.3   < > 30.0*   < > 28.7* 24.0* 29.4* 28.0*  MCV 83.6   < > 82.4  --  81.3  --  80.3 82.8  PLT 220   < > 144*  --  151  --  202 188   < > = values in this interval not displayed.    Medications:     acetaminophen  650 mg Oral Q6H   Or   acetaminophen (TYLENOL) oral liquid 160 mg/5 mL  650 mg Per Tube Q6H   Or   acetaminophen  650 mg Rectal Q6H   amLODipine  10 mg Per Tube Daily   apixaban  2.5 mg Per Tube BID   atorvastatin  80 mg Per Tube Daily   Chlorhexidine Gluconate Cloth  6 each Topical Q0600   docusate  100 mg Per Tube BID   free water  75 mL Per Tube Q4H   insulin aspart  0-20 Units Subcutaneous Q4H   mouth rinse  15 mL Mouth Rinse 4 times per day   QUEtiapine  25 mg Per Tube QHS   ticagrelor  90 mg Per Tube BID      Paulene Floor, MD 02/23/2023, 9:49 AM

## 2023-02-23 NOTE — Progress Notes (Signed)
Went back in atrial fibrillation, this time with RVR and rates between 130-160. Good blood pressure and strong radial pulses. Starting diltiazem bolus and infusion to target HR of 65-110.  Marrianne Mood MD 02/23/2023, 5:32 PM

## 2023-02-23 NOTE — Progress Notes (Signed)
Physical Therapy Treatment Patient Details Name: Xavier [name] MRN: [id number] DOB: February 25, 1941 Today's Date: 02/23/2023   History of Present Illness Patient is 82 y.o. male who presented 8/9 after acute onset left-sided weakness, facial droop, and aphasia. 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 8/9 pt s/p TNK and thrombectomy and revascularization with stent in MCA M1 segment. Pt intubated for airway protection, antibiotics started for aspiration, antivirals started for COVID and flu on 8/9. Pt failed extubation on 8/11 and re-intubated. Successful extubation to [facility] on 8/13. PMH significant for DM, HTN, prostate cancer.    PT Comments  Pt with increased alertness this session, able to answer orientation questions and follow ~50% of verbal only commands. Pt able to come to sit EOB with max A and step by step cues to bring Les to and off EOB and to elevate trunk. Pt demonstrating fair seated balance with ability to maintain midline with feet supported and at least single UE support. Attempted sit<>stand x2 with total A however pt unable to coordinate power through Les to elevate hips. Pt fatiguing quickly and able to state need to lie back down, able to complete with mod A. Pt continues to be limited by impaired cognition, impaired coordination, decreased activity tolerance and poor balance/postural reactions. Pt positioned with HOB elevated and SLP present at end of session. Pt continues to benefit from skilled PT services to progress toward functional mobility goals.      If plan is discharge home, recommend the following: Two people to help with walking and/or transfers;Two people to help with bathing/dressing/bathroom;Assistance with cooking/housework;Assistance with feeding;Direct supervision/assist for medications management;Assist for transportation;Help with stairs or ramp for entrance;Supervision due to cognitive status   Can  travel by private vehicle     No  Equipment Recommendations   (TBD)    Recommendations for Other Services       Precautions / Restrictions Precautions Precautions: Fall Precaution Comments: HHFNC; coretrack Restrictions Weight Bearing Restrictions: No     Mobility  Bed Mobility Overal bed mobility: Needs Assistance Bed Mobility: Supine to Sit, Sit to Supine     Supine to sit: Max assist Sit to supine: Mod assist   General bed mobility comments: pt needing step by step cues to come to sit EOB, max A to manage LEs and elevate trunk, mod A to return LEs to bed as pt stating he was tired and needed to lie down, able to lower trunk to supine without outside and start LEs into bed    Transfers Overall transfer level: Needs assistance Equipment used: 2 person hand held assist Transfers: Sit to/from Stand Sit to Stand: Total assist           General transfer comment: total assist to attempt with pt giving zero effort through LEs to power up. total A to very minimally lift hips    Ambulation/Gait               General Gait Details: unable   Stairs             Wheelchair Mobility     Tilt Bed    Modified Rankin (Stroke Patients Only)       Balance Overall balance assessment: Needs assistance Sitting-balance support: Bilateral upper extremity supported, Feet supported Sitting balance-Leahy Scale: Fair Sitting balance - Comments: able to maintain seated balance with intermittent min A down to CGA for safety, pt leaning anterior with fatigue  but no over LOB                                    Cognition Arousal: Lethargic, Alert Behavior During Therapy: Flat affect Overall Cognitive Status: Impaired/Different from baseline                                 General Comments: pt oriented to self, able to indetify wife with prompts, oriented to year and not month, pt with constant movement of LUE throught session,         Exercises Other Exercises Other Exercises: pt unable to follow commands to coordinate LE exercise    General Comments General comments (skin integrity, edema, etc.): VSS on HHFNC at FiO2 75% 25L      Pertinent Vitals/Pain Pain Assessment Pain Assessment: Faces Faces Pain Scale: Hurts a little bit Pain Location: general discomfort Pain Descriptors / Indicators: Grimacing Pain Intervention(s): Monitored during session, Limited activity within patient's tolerance    Home Living                          Prior Function            PT Goals (current goals can now be found in the care plan section) Acute Rehab PT Goals Patient Stated Goal: regain function PT Goal Formulation: With family Time For Goal Achievement: 03/08/23 Progress towards PT goals: Progressing toward goals    Frequency    Min 1X/week      PT Plan      Co-evaluation              AM-PAC PT "6 Clicks" Mobility   Outcome Measure  Help needed turning from your back to your side while in a flat bed without using bedrails?: Total Help needed moving from lying on your back to sitting on the side of a flat bed without using bedrails?: Total Help needed moving to and from a bed to a chair (including a wheelchair)?: Total Help needed standing up from a chair using your arms (e.g., wheelchair or bedside chair)?: Total Help needed to walk in hospital room?: Total Help needed climbing 3-5 steps with a railing? : Total 6 Click Score: 6    End of Session Equipment Utilized During Treatment: Oxygen Activity Tolerance: Patient tolerated treatment well;Patient limited by fatigue Patient left: in bed;with call bell/phone within reach;with family/visitor present;Other (comment) (with SLP present) Nurse Communication: Mobility status;Other (comment) (tube feed off and restraints off for pt to work with SLP) PT Visit Diagnosis: Other abnormalities of gait and mobility (R26.89);Muscle weakness  (generalized) (M62.81);Difficulty in walking, not elsewhere classified (R26.2);Other symptoms and signs involving the nervous system (R29.898)     Time: 1478-2956 PT Time Calculation (min) (ACUTE ONLY): 22 min  Charges:    $Therapeutic Activity: 8-22 mins PT General Charges $$ ACUTE PT VISIT: 1 Visit                      R. PTA Acute Rehabilitation Services Office: [phone]   Catalina Antigua 02/23/2023, 2:19 PM

## 2023-02-23 NOTE — Plan of Care (Signed)
  Problem: Education: Goal: Knowledge of disease or condition will improve Outcome: Progressing Goal: Knowledge of secondary prevention will improve (MUST DOCUMENT ALL) Outcome: Progressing Goal: Knowledge of patient specific risk factors will improve (Mark N/A or DELETE if not current risk factor) Outcome: Progressing   

## 2023-02-23 NOTE — Progress Notes (Signed)
NAME:  Xavier [name], MRN:  [id number], DOB:  Mar 31, 1941, LOS: 6 ADMISSION DATE:  02/17/2023  History of Present Illness:  82 year old male with hypertension, diabetes, CKD 3B, who presented after acute onset left-sided weakness, facial droop, and aphasia.  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  Tenecteplase administered.  He was intubated for airway protection.  He underwent revascularization and stenting of occluded right MCA and revascularization of  occluded right PCA without stenting.  Notably, he was diagnosed with COVID and flu on same day as strokelike symptoms occurred.  Significant Hospital Events:  8/9 admitted to MICU at [facility] after acute cerebral infarct s/p TNK and thrombectomy, intubated for airway protection, antibiotics started for aspiration, antivirals started for COVID and flu 8/11 extubated but re-intubated for respiratory failure 8/13 extubated to heated high-flow [place]  Interim History / Subjective:  Afebrile overnight Decreasing FiO2 needs Dexmedetomidine running at 0.4 mcg/kg/h 3100 UOP Reports "pain in butt"  Objective   Blood pressure (!) 162/50, pulse 60, temperature 98.3 F (36.8 C), temperature source Oral, resp. rate (!) 25, height 6\' 2"  (1.88 m), weight 81.1 kg, SpO2 100%.    FiO2 (%):  [75 %-90 %] 75 %   Intake/Output Summary (Last 24 hours) at 02/23/2023 0923 Last data filed at 02/23/2023 0800 Gross per 24 hour  Intake 2075.73 ml  Output 3750 ml  Net -1674.27 ml   Filed Weights   02/22/23 0500 02/22/23 0800 02/23/23 0500  Weight: 81.1 kg 81.1 kg 81.1 kg    Examination: No apparent distress NG tube in place, high-flow [place] in place Heart normal, rhythm regular, radial pulses strong, left foot cool compared to right Breathing is tachypneic, lungs with crackles and scant expiratory wheeze over anterior fields Skin is warm and dry Alert to verbal, follows  simple instructions, thumbs up and tongue out, wiggles toes, moving all extremities spontaneously, no facial asymmetry  Labs: Sodium 145 Potassium 4.1 Bicarb 22 Creatinine 3.3, stable Phosphorus 4.7 Magnesium 3.2 Albumin 3 WBC 9.7, decreasing Hemoglobin 9 Platelets 188 Decreasing procalcitonin  Resolved Hospital Problem list   Resolved Problems:   * No resolved hospital problems. 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 and revascularization with stent in MCA M1 segment - ticagrelor and atorvastatin - aspirin dc'd in favor of anticoagulation for afib - BP < 160  Metabolic encephalopathy Delirium - DC unnecessary lines and tubes - mixed with component of sedation - Wean Precedex to off today  Paroxysmal atrial fibrillation - Back in sinus rhythm as of yesterday afternoon - Newly diagnosed - Critical illness induced versus chronic paroxysmal a-fib, suspect latter given embolic cerebral infarct - Transition to Eliquis 2.5 mg twice daily today  Aspiration pneumonia versus pneumonitis Acute hypoxic respiratory failure, improving - Stable respiratory status, WBC and Pro-Cal both down today - Continue Zosyn to complete 5 days through 8/16 - Heated high flow via nasal cannula SpO2 >92% - COVID and flu negative on repeat test  Hematuria - Foley catheter DC'd in favor of external urinary catheter - CBC is stable  AKI on CKD IIIb, borderline nonoliguric 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 ? Post  ATN diuresis - Appreciate nephrology assistance - Maintained good urine output yesterday - Creatinine stable around 3.3  Labile blood pressure - SBP < 180 mmHg - Treat to cuff pressures - Home amlodipine  Nutrition  Status: Nutrition Problem: Inadequate oral intake Etiology: acute illness Signs/Symptoms: NPO status Interventions: Tube feeding  DM2 - SSI for now  Rectal pain - Discontinued rectal tube yesterday  Goals of care - guarded prognosis - appreciate palliative assistance  Best practice (daily eval):  Diet/type: tubefeeds DVT prophylaxis: Therapeutic Eliquis GI prophylaxis: N/A Lines: N/A Foley: Discontinued in favor of external urinary catheter Code Status:  full code Last date of multidisciplinary goals of care discussion [02/22/23]  Marrianne Mood MD 02/23/2023, 9:23 AM  Pager: [phone]

## 2023-02-23 NOTE — Progress Notes (Signed)
PM rounds  Off preceddex More awake More oriented Remain on HFNC Off infusion  Plan  - bipap tonight  - conisder LTAC 02/24/23     SIGNATURE    Dr. Kalman Shan, M.D., F.C.C.P,  Pulmonary and Critical Care Medicine Staff Physician, [facility] Center Director - Interstitial Lung Disease  Program  Pulmonary Fibrosis [facility] at [facility] [place], [place], [address]   Pager: [phone], If no answer  -> Check AMION or Try [phone] Telephone (clinical office): [phone] Telephone (research): [phone]  4:18 PM 02/23/2023

## 2023-02-23 NOTE — Progress Notes (Signed)
eLink Physician-Brief Progress Note Patient Name: Xavier [name] DOB: [birth year] MRN: [id number]   Date of Service  02/23/2023  HPI/Events of Note  Patient resting but readily rousable, heart rate is now controlled (70 - 80 bpm) on 15 mg of Cardizem.  eICU Interventions  Okay to wean Cardizem gtt as long as heart rate between 50 - 105.        Thomasene Lot  02/23/2023, 10:01 PM

## 2023-02-23 NOTE — Progress Notes (Signed)
Speech Language Pathology Treatment: Dysphagia  Patient Details Name: Xavier [name] MRN: [id number] DOB: [birth year] Today's Date: 02/23/2023 Time: 8657-8469 SLP Time Calculation (min) (ACUTE ONLY): 20 min  Assessment / Plan / Recommendation Clinical Impression  Patient seen by SLP for skilled treatment focused on dysphagia goals. Patient was awake and more alert than previous session but he is confused, constantly moving around in bed, especially with left UE, requiring replacement of wrist restraint during session. Patient continues with hoarse, dysphonic voice. His RR increases to high 30's at times but not sustaining that high as he was previous date. SLP assessed his toleration of thin liquids (water) and puree solids (applesauce). He was very impulsive when drinking water via cup or via straw but even with large sips, no overt s/s aspiration observed. SLP did observe instances of brief oral holding, discoordination of swallow and suspected delayed swallow initiation but overall toleration was good. SLP recommending continue NPO status but allow water sips and applesauce PRN when alert. SLP will continue to follow for PO readiness.     HPI HPI: Patient is an 82 y.o. male with PMH: type 2 diabetes mellitus, prostate cancer, and essential hypertension. He presented to the hospital on 02/17/2023 after syncopal episode with associated foaming at the mouth and facial asymmetry. [name] reported to ED staff that patient had not been himself over past few months with significant drowsiness and hypersomnia. He was diagnosed with Covid by his PCP on afternoon of admission and while in the car on way home, left side of face began to droop and he lost consciousness and made a regurgitating sound. 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 He underwent mechanical thrombectomy on 8/9. He was intubated for procedure, extubated aftewards but had to be reintubated on 8/11 due to hypoxia. He  was extubated on 8/13. [name] 8/14.      SLP Plan  Continue with current plan of care      Recommendations for follow up therapy are one component of a multi-disciplinary discharge planning process, led by the attending physician.  Recommendations may be updated based on patient status, additional functional criteria and insurance authorization.    Recommendations  Diet recommendations: NPO;Other(comment) (PRN water, applesauce with RN) Medication Administration: Via alternative means Supervision: Full supervision/cueing for compensatory strategies Compensations: Small sips/bites;Slow rate Postural Changes and/or Swallow Maneuvers: Seated upright 90 degrees                  Staff/trained caregiver to provide oral care;Oral care BID;Oral care prior to ice chip/H20;Oral care before and after PO   Frequent or constant Supervision/Assistance Dysphagia, unspecified (R13.10)     Continue with current plan of care     Angela Nevin, MA, CCC-SLP Speech Therapy
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
## 2023-02-24 ENCOUNTER — Inpatient Hospital Stay (HOSPITAL_COMMUNITY): Payer: PPO

## 2023-02-24 DIAGNOSIS — J9601 Acute respiratory failure with hypoxia: Secondary | ICD-10-CM | POA: Diagnosis not present

## 2023-02-24 DIAGNOSIS — Z515 Encounter for palliative care: Secondary | ICD-10-CM | POA: Diagnosis not present

## 2023-02-24 DIAGNOSIS — Z7189 Other specified counseling: Secondary | ICD-10-CM | POA: Diagnosis not present

## 2023-02-24 DIAGNOSIS — I639 Cerebral infarction, unspecified: Secondary | ICD-10-CM | POA: Diagnosis not present

## 2023-02-24 LAB — CBC
HCT: 30.8 % — ABNORMAL LOW (ref 39.0–52.0)
Hemoglobin: 9.6 g/dL — ABNORMAL LOW (ref 13.0–17.0)
MCH: 25.9 pg — ABNORMAL LOW (ref 26.0–34.0)
MCHC: 31.2 g/dL (ref 30.0–36.0)
MCV: 83 fL (ref 80.0–100.0)
Platelets: 226 10*3/uL (ref 150–400)
RBC: 3.71 MIL/uL — ABNORMAL LOW (ref 4.22–5.81)
RDW: 14.9 % (ref 11.5–15.5)
WBC: 13 10*3/uL — ABNORMAL HIGH (ref 4.0–10.5)
nRBC: 0.2 % (ref 0.0–0.2)

## 2023-02-24 LAB — RENAL FUNCTION PANEL
Albumin: 2.9 g/dL — ABNORMAL LOW (ref 3.5–5.0)
Anion gap: 14 (ref 5–15)
BUN: 86 mg/dL — ABNORMAL HIGH (ref 8–23)
CO2: 25 mmol/L (ref 22–32)
Calcium: 8.9 mg/dL (ref 8.9–10.3)
Chloride: 110 mmol/L (ref 98–111)
Creatinine, Ser: 2.46 mg/dL — ABNORMAL HIGH (ref 0.61–1.24)
GFR, Estimated: 26 mL/min — ABNORMAL LOW (ref >60)
Glucose, Bld: 186 mg/dL — ABNORMAL HIGH (ref 70–99)
Phosphorus: 3.6 mg/dL (ref 2.5–4.6)
Potassium: 3.4 mmol/L — ABNORMAL LOW (ref 3.5–5.1)
Sodium: 149 mmol/L — ABNORMAL HIGH (ref 135–145)

## 2023-02-24 LAB — GLUCOSE, CAPILLARY
Glucose-Capillary: 245 mg/dL — ABNORMAL HIGH (ref 70–99)
Glucose-Capillary: 150 mg/dL — ABNORMAL HIGH (ref 70–99)
Glucose-Capillary: 280 mg/dL — ABNORMAL HIGH (ref 70–99)
Glucose-Capillary: 236 mg/dL — ABNORMAL HIGH (ref 70–99)
Glucose-Capillary: 180 mg/dL — ABNORMAL HIGH (ref 70–99)
Glucose-Capillary: 110 mg/dL — ABNORMAL HIGH (ref 70–99)

## 2023-02-24 LAB — PROCALCITONIN: Procalcitonin: 3.79 ng/mL

## 2023-02-24 LAB — TRIGLYCERIDES: Triglycerides: 148 mg/dL (ref <150)

## 2023-02-24 LAB — MAGNESIUM: Magnesium: 3.3 mg/dL — ABNORMAL HIGH (ref 1.7–2.4)

## 2023-02-24 LAB — LACTIC ACID, PLASMA: Lactic Acid, Venous: 1.7 mmol/L (ref 0.5–1.9)

## 2023-02-24 LAB — SEDIMENTATION RATE: Sed Rate: 126 mm/h — ABNORMAL HIGH (ref 0–16)

## 2023-02-24 MED ORDER — INSULIN GLARGINE-YFGN 100 UNIT/ML ~~LOC~~ SOLN
10.0000 [IU] | Freq: Every day | SUBCUTANEOUS | Status: DC
  Administered 2023-02-24: 10 [IU] via SUBCUTANEOUS
  Filled 2023-02-24: qty 0.1

## 2023-02-24 MED ORDER — ORAL CARE MOUTH RINSE: 15.0000 mL | OROMUCOSAL | Status: DC | PRN

## 2023-02-24 MED ORDER — INSULIN ASPART 100 UNIT/ML IJ SOLN
5.0000 [IU] | INTRAMUSCULAR | Status: DC
  Administered 2023-02-24 – 2023-02-25 (×7): 5 [IU] via SUBCUTANEOUS

## 2023-02-24 MED ORDER — CARVEDILOL 25 MG PO TABS
25.0000 mg | ORAL_TABLET | Freq: Two times a day (BID) | ORAL | Status: DC
  Administered 2023-02-24 – 2023-02-25 (×2): 25 mg via ORAL
  Filled 2023-02-24 (×2): qty 1

## 2023-02-24 MED ORDER — HYDRALAZINE HCL 20 MG/ML IJ SOLN
5.0000 mg | INTRAMUSCULAR | Status: DC | PRN
  Administered 2023-02-24 (×2): 10 mg via INTRAVENOUS
  Filled 2023-02-24 (×2): qty 1

## 2023-02-24 MED ORDER — PIPERACILLIN-TAZOBACTAM 3.375 G IVPB
3.3750 g | Freq: Three times a day (TID) | INTRAVENOUS | Status: AC
  Administered 2023-02-24 – 2023-02-25 (×3): 3.375 g via INTRAVENOUS
  Filled 2023-02-24 (×4): qty 50

## 2023-02-24 MED ORDER — FREE WATER
200.0000 mL | Status: DC
  Administered 2023-02-24 (×2): 200 mL

## 2023-02-24 MED ORDER — ORAL CARE MOUTH RINSE
15.0000 mL | OROMUCOSAL | Status: DC
  Administered 2023-02-24 – 2023-03-02 (×27): 15 mL via OROMUCOSAL

## 2023-02-24 MED ORDER — INSULIN GLARGINE-YFGN 100 UNIT/ML ~~LOC~~ SOLN
30.0000 [IU] | Freq: Every day | SUBCUTANEOUS | Status: DC
  Administered 2023-02-25: 30 [IU] via SUBCUTANEOUS
  Filled 2023-02-24 (×2): qty 0.3

## 2023-02-24 MED ORDER — ONDANSETRON HCL 4 MG/2ML IJ SOLN
4.0000 mg | Freq: Four times a day (QID) | INTRAMUSCULAR | Status: DC | PRN
  Administered 2023-02-24 – 2023-02-27 (×3): 4 mg via INTRAVENOUS
  Filled 2023-02-24 (×4): qty 2

## 2023-02-24 MED ORDER — POTASSIUM CHLORIDE 20 MEQ PO PACK
40.0000 meq | PACK | Freq: Once | ORAL | Status: AC
  Administered 2023-02-24: 40 meq
  Filled 2023-02-24: qty 2

## 2023-02-24 NOTE — Inpatient Diabetes Management (Addendum)
Inpatient Diabetes Program Recommendations  AACE/ADA: New Consensus Statement on Inpatient Glycemic Control   Target Ranges:  Prepandial:   less than 140 mg/dL      Peak postprandial:   less than 180 mg/dL (1-2 hours)      Critically ill patients:  140 - 180 mg/dL    Latest Reference Range & Units 02/23/23 07:27 02/23/23 11:33 02/23/23 15:27 02/23/23 19:35 02/23/23 23:20 02/24/23 03:15 02/24/23 07:33  Glucose-Capillary 70 - 99 mg/dL 324 (H) 401 (H) 027 (H) 206 (H) 254 (H) 245 (H) 150 (H)   Review of Glycemic Control  Diabetes history: DM2 Outpatient Diabetes medications: Toujeo 40 units daily, Farxiga 10 mg daily Current orders for Inpatient glycemic control: Novolog 0-20 units Q4H, Novolog 3 units Q4H; Glucerna @ 55 ml/hr  Inpatient Diabetes Program Recommendations:    Insulin: Please consider ordering Semglee 10 units Q24H and increasing tube feeding coverage to Novolog 5 units Q4H.  Thanks, Orlando Penner, RN, MSN, CDCES Diabetes Coordinator Inpatient Diabetes Program [phone] (Team Pager from 8am to 5pm)

## 2023-02-24 NOTE — Progress Notes (Signed)
NAME:  Xavier [name], MRN:  [id number], DOB:  07-04-1941, LOS: 7 ADMISSION DATE:  02/17/2023  History of Present Illness:  82 year old male with hypertension, diabetes, CKD 3B, who presented after acute onset left-sided weakness, facial droop, and aphasia.  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  Tenecteplase administered.  He was intubated for airway protection.  He underwent revascularization and stenting of occluded right MCA and revascularization of  occluded right PCA without stenting.  Notably, he was diagnosed with COVID and flu on same day as strokelike symptoms occurred.  Significant Hospital Events:  8/9 admitted to MICU at [facility] after acute cerebral infarct s/p TNK and thrombectomy, intubated for airway protection, antibiotics started for aspiration, antivirals started for COVID and flu 8/11 extubated but re-intubated for respiratory failure 8/13 extubated to heated high-flow [place] 8/14 in and out of afib 8/15 back into afib  Interim History / Subjective:  No events overnight. This morning, concern for increasing oxygen requirements. Vomiting x 2. Diltiazem weaned to off 2800 UOP  Objective   Blood pressure (!) 187/69, pulse 89, temperature 99 F (37.2 C), temperature source Oral, resp. rate (!) 25, height 6\' 2"  (1.88 m), weight 81.1 kg, SpO2 (!) 89%.    FiO2 (%):  [60 %-100 %] 60 %   Intake/Output Summary (Last 24 hours) at 02/24/2023 0854 Last data filed at 02/24/2023 0600 Gross per 24 hour  Intake 2192.24 ml  Output 2050 ml  Net 142.24 ml   Filed Weights   02/22/23 0800 02/23/23 0500 02/24/23 0500  Weight: 81.1 kg 81.1 kg 81.1 kg    Examination: No apparent distress NG tube in place, heated high flow nasal cannula in place Heart rates normal, rhythm is regular, radial pulses strong Breathing is tachypneic but unlabored on heated high flow via nasal cannula Abdomen soft  and nontender, somewhat tympanic to percussion Foley in place draining clear urine Alert and oriented, follows instructions  Labs: Sodium 149 increasing Potassium 3.4 Bicarb 25 BUN/creatinine 86/2.46, improving Magnesium 3.3 WBC 13 Hemoglobin 9.6 Platelets 226 ESR 126  Resolved Hospital Problem list   Resolved Problems:   * No resolved hospital problems. *   Assessment & Plan:  Principal Problem:   Acute ischemic stroke [facility]) Active Problems:   Acute respiratory failure with hypoxia (HCC)   Pneumonia due to COVID-19 virus   Aspiration pneumonia of both lungs due to gastric secretions (HCC)   Influenza  Right MCA and PCA infarcts s/p TNK and revascularization with stent in MCA M1 segment - ticagrelor and atorvastatin - aspirin dc'd in favor of anticoagulation for afib - BP < 160  Metabolic encephalopathy - Improving - Avoid infusions, tubes, restraints - Dexmedetomidine discontinued  Paroxysmal atrial fibrillation - Diltiazem bolus and infusion yesterday, infusion DC'd overnight - Sinus rhythm today - Restarting home carvedilol for hypertension  Aspiration pneumonia versus pneumonitis Acute hypoxic respiratory failure, improving - Difficult SpO2 plethysmography - Zosyn to finish today - Heated high flow via nasal cannula SpO2 >92% - COVID and flu negative on repeat test  Nausea with vomiting - Ondansetron 4 mg IV every 6 hours as needed x 4  Hypernatremia - Increase free water flush to 200 mL every 4 hours  Hematuria - Hematuria resolved - Yellow urine in Foley - CBC stable  Urinary retention - Was getting around 600-700 UOP with I/O cath yesterday - Foley in place - Doxazosin  started  AKI on CKD IIIb, borderline nonoliguric 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 ? Post ATN diuresis - Maintaining good urine output - Renal function improved  Labile blood pressure - SBP < 180 mmHg - Hydralazine as needed for SBP > 180 - Home amlodipine - Restart  home carvedilol  Nutrition Status: Nutrition Problem: Inadequate oral intake Etiology: acute illness Signs/Symptoms: NPO status Interventions: Tube feeding  DM2 - Semglee 10 units daily - NovoLog 5 units every 4 hours - SSI  Rectal pain - Discontinued rectal tube yesterday  Goals of care - Stable for LTAC  Best practice (daily eval):  Diet/type: tubefeeds, water sips and PRN applesauce DVT prophylaxis: Therapeutic Eliquis GI prophylaxis: N/A Lines: N/A Foley: Foley Code Status:  full code Last date of multidisciplinary goals of care discussion [02/22/23]  Marrianne Mood MD 02/24/2023, 8:54 AM  Pager: [phone]

## 2023-02-24 NOTE — Progress Notes (Signed)
   02/24/23 0320  Therapy Vitals  Patient Position (if appropriate) Lying  Oxygen Therapy/Pulse Ox  O2 Device HFNC  O2 Therapy Oxygen humidified  O2 Flow Rate (L/min) 40 L/min  FiO2 (%) 60 %   Decreased weaning pt as tolerated

## 2023-02-24 NOTE — Progress Notes (Signed)
   Palliative Medicine Inpatient Follow Up Note HPI: 82 year old male with hypertension, diabetes, CKD 3B, PAD, HFpEF. (+) right MCA, PNA, AKI. Palliative care asked to get involved for further goals of care conversations in the setting of severe illness.   Today's Discussion 02/24/2023  *Please note that this is a verbal dictation therefore any spelling or grammatical errors are due to the "Dragon [employer] One" system interpretation.  Chart reviewed inclusive of vital signs, progress notes, laboratory results, and diagnostic images.   I spoke with patients RN, Isabelle Course. She shares that Xavier [name] has had three projectile voiting  I met at bedside with [name] and his spouse, [name] this afternoon. He is awake and alert. We discussed his present health state. I gently broached the topic of his wishes for the future. We discussed resuscitation and the possibilities should these be needed. We reviewed that as of now, he thinks he would like to be resuscitated, he shares "I'm not ready to die yet." I provided further information on this with the "Hard Choices book". We reviewed creating a Living Will to better dictate goals for the future. I also gave a MOST form to further supplement patient and his family regarding decisions moving into the future.   Education provided on the importance of advance care planning.  Questions and concerns addressed. Palliative Support Provided.   Objective Assessment: Vital Signs Vitals:   02/24/23 1000 02/24/23 1030  BP: (!) 165/67 (!) 149/55  Pulse:    Resp: (!) 26 (!) 31  Temp:    SpO2: 93% 94%    Intake/Output Summary (Last 24 hours) at 02/24/2023 1059 Last data filed at 02/24/2023 0600 Gross per 24 hour  Intake 2052.06 ml  Output 2050 ml  Net 2.06 ml   Last Weight  Most recent update: 02/24/2023  5:45 AM    Weight  81.1 kg (178 lb 12.7 oz)            Gen: Elderly African-American male critically ill HEENT: NGT in place, dry mucous membranes CV:  Regular rate and rhythm PULM: On 25LPM HFNC, breathing is nonlabored ABD: soft/nontender EXT: (+) edema Neuro: Awake and alert  SUMMARY OF RECOMMENDATIONS   Full code/full scope of care    Continue to allow time for outcomes  Plan for SELECT Placement    Ongoing palliative support _____________________________ Symptoms:  Post-Stroke Spacticity: - Consider a low dose anti-spasmatic medication if needed. Complicated as could worsen sedation and increase aspiration risk. Other considerations would be propanolol and/or Botox injection though will defer primary  Projectile Vomiting: - Have spoke to primary team - Not r/t zosyn - Consider further radiographic imaging  Billing based on MDM: High  ______________________________________________________________________________________ Xavier [name] Palliative Medicine Team Team Cell Phone: [phone] Please utilize secure chat with additional questions, if there is no response within 30 minutes please call the above phone number  Palliative Medicine Team providers are available by phone from 7am to 7pm daily and can be reached through the team cell phone.  Should this patient require assistance outside of these hours, please call the patient's attending physician.

## 2023-02-24 NOTE — Progress Notes (Signed)
[facility] Progress Note   82 y.o. male DM2, prostate cancer, HTN presenting after a syncopal episode noted with foaming at the mouth, drowsiness and hypersomnia progressing over the past week. After the clinic visit he became diaphoretic  and noted to have a left facial droop + LOC. In the ED he was noted to have left sided weakness and aphasia w/ desaturations to the 70's as well. CTA on 8/9 w/ 75ml of Iohexol showed a right M1 occlusion leading to TNKase, Iohexol x2 as well later that evening on 8/9 with mechanical thrombectomy of right M1 and right P2 with stenting and PTA required. Patient has also had hypotensive episodes requiring Levophed on 8/11-8/12 but actually became very hypertensive after extubation with systolic ~220's.    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 Currently the acute component of renal failure is likely from contrast induced nephropathy. U/S showed mild caliectasis on the right side but no e/o hydronephrosis.  - Already d/w spouse and patient did not want to be on dialysis but likely was referring to chronic standard three times per week outpt dialysis treatments. She stated that they do want dialysis in the short term period if needed to hopefully allow his renal function to recover.  - Large amount of UOP over night (2800) and renal function continues to slowly improve. Per RN has required in and out cath - Off Lasix.  Signing off at this time; please reconsult as needed.  -Maintain MAP>65 for optimal renal perfusion.  - Avoid nephrotoxic medications including NSAIDs and iodinated intravenous contrast exposure unless the latter is absolutely indicated.   - Preferred narcotic agents for pain control are hydromorphone, fentanyl, and methadone. Morphine should not be used.  - Avoid Baclofen and avoid oral sodium phosphate and magnesium citrate based laxatives / bowel  preps.  - Continue strict Input and Output monitoring.     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 Spouse bedside as well. Aspiration PNA with hypotension requiring Levophed but currently hypertensive requiring Cleviprex after extubation on broad spectrum abx. Hypertension - back on Cleviprex after extubation; for the 1st 24hours would keep SBP ~120-160 and then can targe 110-150 following. His BP has been very labile and Cleviprex may even be titrated down / off if he is given sedation; appears very agitated. DM2 per primary team.  Subjective:   Large UOP over past 24hrs, following commands. Off Cleviprex.   Objective:   BP (!) 187/69   Pulse 85   Temp 99 F (37.2 C) (Oral)   Resp (!) 24   Ht 6\' 2"  (1.88 m)   Wt 81.1 kg   SpO2 93%   BMI 22.96 kg/m   Intake/Output Summary (Last 24 hours) at 02/24/2023 0916 Last data filed at 02/24/2023 0600 Gross per 24 hour  Intake 2192.24 ml  Output 2050 ml  Net 142.24 ml   Weight change: 0 kg  Physical Exam: General appearance: just received sedation Head: NCAT Back: No CVA tenderness. Resp: rhonchi bibasilar but no rales Cardio: S1, S2 normal GI: SNDNT+BS Extremities: edema none Pulses: 2+ and symmetric Skin: Skin color, texture, turgor normal. No rashes or lesions  Imaging: DG CHEST PORT 1 VIEW  Result Date: 02/23/2023 CLINICAL DATA:  Respiratory distress EXAM: PORTABLE CHEST 1 VIEW COMPARISON:  Chest radiograph 02/19/2023 FINDINGS: The endotracheal  tube has been removed. The enteric catheter tip is below the field of view. The cardiomediastinal silhouette is stable. Extensive interstitial opacities are again seen throughout the right lung which are overall worsened since the prior study; however, there is improved confluent perihilar opacity. There are increased interstitial markings in the left lung though to a lesser extent than on the right.  There is a small right pleural effusion which appears increased since the prior study. There is no significant left effusion. There is no pneumothorax There is no acute osseous abnormality. IMPRESSION: 1. Right worse than left interstitial opacities are worsened compared to the prior study most likely reflecting pulmonary interstitial edema, with a small right pleural effusion which appears increased in size. 2. Confluent airspace opacity in the right perihilar region has improved. Electronically Signed   By: Lesia Hausen M.D.   On: 02/23/2023 14:39   VAS [place] LOWER EXTREMITY VENOUS (DVT)  Result Date: 02/23/2023  Lower Venous DVT Study Patient Name:  Xavier [name]  Date of Exam:   02/22/2023 Medical Rec #: [id number]         Accession #:    [id number] Date of Birth: 08/31/1940         Patient Gender: M Patient Age:   82 years Exam Location:  [facility] Procedure:      VAS [place] LOWER EXTREMITY VENOUS (DVT) Referring Phys: Levon Hedger --------------------------------------------------------------------------------  Indications: Fever.  Comparison Study: No prior studies. Performing Technologist: Jean Rosenthal RDMS, RVT  Examination Guidelines: A complete evaluation includes B-mode imaging, spectral Doppler, color Doppler, and power Doppler as needed of all accessible portions of each vessel. Bilateral testing is considered an integral part of a complete examination. Limited examinations for reoccurring indications may be performed as noted. The reflux portion of the exam is performed with the patient in reverse Trendelenburg.  +---------+---------------+---------+-----------+----------+--------------+ RIGHT    CompressibilityPhasicitySpontaneityPropertiesThrombus Aging +---------+---------------+---------+-----------+----------+--------------+ CFV      Full           Yes      Yes                                 +---------+---------------+---------+-----------+----------+--------------+ SFJ       Full                                                        +---------+---------------+---------+-----------+----------+--------------+ FV Prox  Full                                                        +---------+---------------+---------+-----------+----------+--------------+ FV Mid   Full                                                        +---------+---------------+---------+-----------+----------+--------------+ FV DistalFull                                                        +---------+---------------+---------+-----------+----------+--------------+  PFV      Full                                                        +---------+---------------+---------+-----------+----------+--------------+ POP      Full           Yes      Yes                                 +---------+---------------+---------+-----------+----------+--------------+ PTV      Full                                                        +---------+---------------+---------+-----------+----------+--------------+ PERO     Full                                                        +---------+---------------+---------+-----------+----------+--------------+   +---------+---------------+---------+-----------+----------+--------------+ LEFT     CompressibilityPhasicitySpontaneityPropertiesThrombus Aging +---------+---------------+---------+-----------+----------+--------------+ CFV      Full           Yes      Yes                                 +---------+---------------+---------+-----------+----------+--------------+ SFJ      Full                                                        +---------+---------------+---------+-----------+----------+--------------+ FV Prox  Full                                                        +---------+---------------+---------+-----------+----------+--------------+ FV Mid   Full                                                         +---------+---------------+---------+-----------+----------+--------------+ FV DistalFull                                                        +---------+---------------+---------+-----------+----------+--------------+ PFV      Full                                                        +---------+---------------+---------+-----------+----------+--------------+  POP      Full           Yes      Yes                                 +---------+---------------+---------+-----------+----------+--------------+ PTV      Full                                                        +---------+---------------+---------+-----------+----------+--------------+ PERO     Full                                                        +---------+---------------+---------+-----------+----------+--------------+     Summary: RIGHT: - There is no evidence of deep vein thrombosis in the lower extremity.  - No cystic structure found in the popliteal fossa. - Heavy arterial calcification noted.  LEFT: - There is no evidence of deep vein thrombosis in the lower extremity.  - No cystic structure found in the popliteal fossa. - Heavy arterial calcification noted.  *See table(s) above for measurements and observations. Electronically signed by Sherald Hess MD on 02/23/2023 at 9:49:27 AM.    Final    DG Abd Portable 1V  Result Date: 02/22/2023 CLINICAL DATA:  Feeding tube placement. EXAM: PORTABLE ABDOMEN - 1 VIEW COMPARISON:  One-view abdomen 02/22/2023 at 9:10 a.m. FINDINGS: Heart is enlarged. Asymmetric right-sided airspace disease is again seen. A small bore feeding tube terminates in the fundus of the stomach. IMPRESSION: 1. Small bore feeding tube terminates in the fundus of the stomach. 2. Asymmetric right-sided airspace disease. Electronically Signed   By: Marin Roberts M.D.   On: 02/22/2023 16:19   DG Abd Portable 1V  Result Date: 02/22/2023 CLINICAL DATA:  NG tube  placement EXAM: PORTABLE ABDOMEN - 1 VIEW COMPARISON:  KUB 3 days prior FINDINGS: The enteric catheter tip is in the distal stomach. There is mild gaseous distention of the stomach and large bowel in the imaged upper abdomen. There is no definite free intraperitoneal air. Extensive airspace disease is again seen in the right lower lobe with a probable layering pleural effusion. IMPRESSION: 1. Enteric catheter tip in the distal stomach. 2. Extensive airspace disease again seen in the right lower lobe with a probable layering pleural effusion. Electronically Signed   By: Lesia Hausen M.D.   On: 02/22/2023 10:48    Labs: BMET Recent Labs  Lab 02/18/23 0344 02/19/23 0301 02/19/23 1802 02/21/23 0024 02/21/23 0045 02/21/23 0414 02/21/23 1001 02/21/23 1754 02/22/23 0615 02/23/23 0051 02/24/23 0635  NA 133* 135   < > 134* 136 134* 141 140 143 145 149*  K 4.4 4.4   < > 5.2* 5.0 5.2* 4.8 4.0 3.8 4.1 3.4*  CL 105 106   < > 104  --  105 105 107 105 107 110  CO2 18* 20*   < > 17*  --  18* 19* 20* 22 22 25   GLUCOSE 244* 195*   < > 426*  --  334* 144* 94 113* 249* 186*  BUN 28* 47*   < > 88*  --  88* 92* 91* 96* 97* 86*  CREATININE 1.98* 3.03*   < > 3.88*  --  3.81* 3.86* 3.58* 3.38* 3.30* 2.46*  CALCIUM 7.8* 7.8*   < > 8.0*  --  7.8* 8.8* 9.0 9.3 8.5* 8.9  PHOS 2.3* 3.4  --   --   --   --   --   --   --  4.7* 3.6   < > = values in this interval not displayed.   CBC Recent Labs  Lab 02/17/23 1550 02/17/23 1650 02/20/23 0032 02/21/23 0045 02/22/23 0615 02/23/23 0051 02/24/23 0635  WBC 8.6   < > 10.4  --  13.1* 9.7 13.0*  NEUTROABS 5.8  --   --   --   --   --   --   HGB 12.9*   < > 9.5* 8.2* 9.7* 9.0* 9.6*  HCT 40.3   < > 28.7* 24.0* 29.4* 28.0* 30.8*  MCV 83.6   < > 81.3  --  80.3 82.8 83.0  PLT 220   < > 151  --  202 188 226   < > = values in this interval not displayed.    Medications:     acetaminophen  650 mg Oral Q6H   Or   acetaminophen (TYLENOL) oral liquid 160 mg/5 mL  650  mg Per Tube Q6H   Or   acetaminophen  650 mg Rectal Q6H   amLODipine  10 mg Per Tube Daily   apixaban  2.5 mg Per Tube BID   atorvastatin  80 mg Per Tube Daily   carvedilol  25 mg Oral BID WC   Chlorhexidine Gluconate Cloth  6 each Topical Q0600   docusate  100 mg Per Tube BID   doxazosin  1 mg Per Tube Daily   free water  200 mL Per Tube Q4H   insulin aspart  0-20 Units Subcutaneous Q4H   insulin aspart  5 Units Subcutaneous Q4H   insulin glargine-yfgn  10 Units Subcutaneous Daily   mouth rinse  15 mL Mouth Rinse 4 times per day   QUEtiapine  25 mg Per Tube QHS   ticagrelor  90 mg Per Tube BID      Paulene Floor, MD 02/24/2023, 9:16 AM

## 2023-02-24 NOTE — Progress Notes (Signed)
Occupational Therapy Treatment Patient Details Name: Xavier [name] MRN: [id number] DOB: [birth year] Today's Date: 02/24/2023   History of present illness Patient is 82 y.o. male who presented 8/9 after acute onset left-sided weakness, facial droop, and aphasia. 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 8/9 pt s/p TNK and thrombectomy and revascularization with stent in MCA M1 segment. Pt intubated for airway protection, antibiotics started for aspiration, antivirals started for COVID and flu on 8/9. Pt failed extubation on 8/11 and re-intubated. Successful extubation to [facility] on 8/13. PMH significant for DM, HTN, prostate cancer.   OT comments  Pt with slow steady progress toward established OT goals. Greater ability to maintain arousal this session as well as provide verbal responses. Following ~50% of commands with up to max cues. Good initiation on L, but poor motor control while engaged in ADL and at rest. Motor control affecting safety during all mobility at this time and increasing need for physical assist. Pt max soiled and with constant liquid BM, so RN entering session to place flexiseal and asked for assist with rolling/positioning. Pt with good initiation of roll, but up to max A of 2 in R sidelying to maintain position. Pt coming up to EOB with mod A, static sitting with min A. Pt attempting stand but poor initiation of hip/knee extension and thus only able to squat this session. Will continue to follow. Patient will benefit from continued inpatient follow up therapy, <3 hours/day       If plan is discharge home, recommend the following:  Two people to help with walking and/or transfers;Two people to help with bathing/dressing/bathroom;Assistance with cooking/housework;Direct supervision/assist for medications management;Direct supervision/assist for financial management;Assist for transportation;Help with stairs or ramp for  entrance   Equipment Recommendations  Other (comment) (defer)    Recommendations for Other Services      Precautions / Restrictions Precautions Precautions: Fall Precaution Comments: HHFNC; coretrack; flexiseal as of 8/16 Restrictions Weight Bearing Restrictions: No       Mobility Bed Mobility Overal bed mobility: Needs Assistance Bed Mobility: Rolling, Sidelying to Sit, Sit to Supine Rolling: Min assist, Mod assist, +2 for physical assistance (Min A +2 to roll; max A +2 to maintain sidelying during pericare especially on R side due to involuntary motions of LUE) Sidelying to sit: Max assist (guidance of ble AND MOD A to elevate trunk. Max cues throughout)   Sit to supine: Mod assist   General bed mobility comments: Needing step by step cues to come to EOB. Poor coordination affecting need for greater physical assist requiring max A to come to EOB and mod A to return to bed. Signifciant safety assist with sustaining sidelying after rolling as pt with significant movement of LUE in R sidelying as well as kicking BLE off EOB while receiving pericare    Transfers Overall transfer level: Needs assistance Equipment used: 2 person hand held assist Transfers: Sit to/from Stand, Bed to chair/wheelchair/BSC Sit to Stand: Total assist          Lateral/Scoot Transfers: Max assist, +2 safety/equipment, +2 physical assistance General transfer comment: total assist to attempt and pt with poor initiation, hip/knee extension to rise. Opted for lateral scoot toward HOB requiring max blocking at LLE, step by step max multimodal cues, and +2 maxx-total A.     Balance Overall balance assessment: Needs assistance Sitting-balance support: Bilateral upper extremity supported, Feet supported Sitting balance-Leahy Scale: Poor Sitting balance -  Comments: Min A to maintain static sit EOB                                   ADL either performed or assessed with clinical judgement    ADL Overall ADL's : Needs assistance/impaired                         Toilet Transfer: Maximal assistance;+2 for physical assistance;+2 for safety/equipment;Squat-pivot Toilet Transfer Details (indicate cue type and reason): squat pivotx2 with+2 assist to move bottom up higher in bed. Unable to rise to full stand due to poor initiation and power of hip extension and knee extension with attempts to stand. Toileting- Clothing Manipulation and Hygiene: Total assistance;+2 for physical assistance;+2 for safety/equipment Toileting - Clothing Manipulation Details (indicate cue type and reason): Rolling and pt max soiled with liquid BM that did not stop. RN entering session to place flexiseal            Extremity/Trunk Assessment Upper Extremity Assessment Upper Extremity Assessment: RUE deficits/detail;LUE deficits/detail RUE Deficits / Details: Poor functional use but able to follow commands on R and able to use for functional rolling/bed mobility, etc. LUE Deficits / Details: Follows commands with LUE >LLE. Poor control of LUE with involuntary movement in addition to poorly coordinated voluntary movement   Lower Extremity Assessment Lower Extremity Assessment: Defer to PT evaluation        Vision   Additional Comments: able to locate therapist on L and R. Often keeping eyes closed reporting he is tired.   Perception     Praxis      Cognition Arousal: Alert Behavior During Therapy: Restless, Flat affect Overall Cognitive Status: Impaired/Different from baseline Area of Impairment: Memory, Following commands, Safety/judgement, Awareness, Problem solving                     Memory: Decreased short-term memory Following Commands: Follows one step commands inconsistently, Follows one step commands with increased time Safety/Judgement: Decreased awareness of safety (Poor awareness of location of LUE and body control) Awareness: Intellectual (unaware of  BM) Problem Solving: Slow processing, Decreased initiation, Difficulty sequencing, Requires verbal cues General Comments: Pt following one step commands with incresed time and cues. Pt with poor awareness of safety with all mobility requiring nearly constant cues.        Exercises      Shoulder Instructions       General Comments VSS on HHFNC 40L 60% FiO2    Pertinent Vitals/ Pain       Pain Assessment Pain Assessment: Faces Faces Pain Scale: Hurts little more Pain Location: general discomfort Pain Descriptors / Indicators: Grimacing Pain Intervention(s): Limited activity within patient's tolerance, Monitored during session  Home Living                                          Prior Functioning/Environment              Frequency  Min 2X/week        Progress Toward Goals  OT Goals(current goals can now be found in the care plan section)  Progress towards OT goals: Progressing toward goals  Acute Rehab OT Goals Patient Stated Goal: rest OT Goal Formulation: With patient Time For Goal Achievement: 03/08/23 Potential to Achieve  Goals: Good ADL Goals Pt Will Perform Grooming: with set-up;sitting Pt Will Transfer to Toilet: with mod assist;stand pivot transfer Additional ADL Goal #1: Pt will perform bed mobility with min A as a precursor for ADL Additional ADL Goal #2: Pt will follow all one step commands during ADL with increased time and no more than min cues.  Plan      Co-evaluation                 AM-PAC OT "6 Clicks" Daily Activity     Outcome Measure   Help from another person eating meals?: Total Help from another person taking care of personal grooming?: A Lot Help from another person toileting, which includes using toliet, bedpan, or urinal?: Total Help from another person bathing (including washing, rinsing, drying)?: Total Help from another person to put on and taking off regular upper body clothing?: Total Help from  another person to put on and taking off regular lower body clothing?: Total 6 Click Score: 7    End of Session Equipment Utilized During Treatment: Oxygen;Gait belt  OT Visit Diagnosis: Unsteadiness on feet (R26.81);Muscle weakness (generalized) (M62.81);Other symptoms and signs involving cognitive function   Activity Tolerance Patient tolerated treatment well   Patient Left in bed;with bed alarm set;with family/visitor present;with call bell/phone within reach   Nurse Communication Mobility status;Precautions        Time: 1610-9604 OT Time Calculation (min): 45 min  Charges: OT General Charges $OT Visit: 1 Visit OT Treatments $Self Care/Home Management : 8-22 mins $Therapeutic Activity: 23-37 mins  Tyler Deis, OTR/L [facility] Acute Rehabilitation Office: [phone]   Myrla Halsted 02/24/2023, 12:58 PM

## 2023-02-25 DIAGNOSIS — J9601 Acute respiratory failure with hypoxia: Secondary | ICD-10-CM | POA: Diagnosis not present

## 2023-02-25 DIAGNOSIS — J690 Pneumonitis due to inhalation of food and vomit: Secondary | ICD-10-CM | POA: Diagnosis not present

## 2023-02-25 DIAGNOSIS — I639 Cerebral infarction, unspecified: Secondary | ICD-10-CM | POA: Diagnosis not present

## 2023-02-25 DIAGNOSIS — R1310 Dysphagia, unspecified: Secondary | ICD-10-CM

## 2023-02-25 DIAGNOSIS — I480 Paroxysmal atrial fibrillation: Secondary | ICD-10-CM | POA: Insufficient documentation

## 2023-02-25 LAB — RENAL FUNCTION PANEL
Albumin: 2.8 g/dL — ABNORMAL LOW (ref 3.5–5.0)
Anion gap: 14 (ref 5–15)
BUN: 87 mg/dL — ABNORMAL HIGH (ref 8–23)
CO2: 24 mmol/L (ref 22–32)
Calcium: 8.9 mg/dL (ref 8.9–10.3)
Chloride: 116 mmol/L — ABNORMAL HIGH (ref 98–111)
Creatinine, Ser: 2.74 mg/dL — ABNORMAL HIGH (ref 0.61–1.24)
GFR, Estimated: 22 mL/min — ABNORMAL LOW (ref >60)
Glucose, Bld: 166 mg/dL — ABNORMAL HIGH (ref 70–99)
Phosphorus: 3.8 mg/dL (ref 2.5–4.6)
Potassium: 3.7 mmol/L (ref 3.5–5.1)
Sodium: 154 mmol/L — ABNORMAL HIGH (ref 135–145)

## 2023-02-25 LAB — CBC
HCT: 30.4 % — ABNORMAL LOW (ref 39.0–52.0)
Hemoglobin: 9.2 g/dL — ABNORMAL LOW (ref 13.0–17.0)
MCH: 25.8 pg — ABNORMAL LOW (ref 26.0–34.0)
MCHC: 30.3 g/dL (ref 30.0–36.0)
MCV: 85.4 fL (ref 80.0–100.0)
Platelets: 248 10*3/uL (ref 150–400)
RBC: 3.56 MIL/uL — ABNORMAL LOW (ref 4.22–5.81)
RDW: 15.1 % (ref 11.5–15.5)
WBC: 18.3 10*3/uL — ABNORMAL HIGH (ref 4.0–10.5)
nRBC: 0 % (ref 0.0–0.2)

## 2023-02-25 LAB — TROPONIN I (HIGH SENSITIVITY)
Troponin I (High Sensitivity): 140 ng/L (ref <18)
Troponin I (High Sensitivity): 133 ng/L (ref <18)

## 2023-02-25 LAB — GLUCOSE, CAPILLARY
Glucose-Capillary: 132 mg/dL — ABNORMAL HIGH (ref 70–99)
Glucose-Capillary: 141 mg/dL — ABNORMAL HIGH (ref 70–99)
Glucose-Capillary: 181 mg/dL — ABNORMAL HIGH (ref 70–99)
Glucose-Capillary: 265 mg/dL — ABNORMAL HIGH (ref 70–99)
Glucose-Capillary: 288 mg/dL — ABNORMAL HIGH (ref 70–99)
Glucose-Capillary: 345 mg/dL — ABNORMAL HIGH (ref 70–99)
Glucose-Capillary: 38 mg/dL — CL (ref 70–99)
Glucose-Capillary: 38 mg/dL — CL (ref 70–99)

## 2023-02-25 LAB — MAGNESIUM: Magnesium: 3.6 mg/dL — ABNORMAL HIGH (ref 1.7–2.4)

## 2023-02-25 MED ORDER — POTASSIUM CHLORIDE 20 MEQ PO PACK
20.0000 meq | PACK | Freq: Once | ORAL | Status: AC
  Administered 2023-02-25: 20 meq
  Filled 2023-02-25: qty 1

## 2023-02-25 MED ORDER — METOCLOPRAMIDE HCL 5 MG/ML IJ SOLN
5.0000 mg | Freq: Three times a day (TID) | INTRAMUSCULAR | Status: DC
  Administered 2023-02-25 – 2023-03-02 (×17): 5 mg via INTRAVENOUS
  Filled 2023-02-25 (×17): qty 2

## 2023-02-25 MED ORDER — NITROGLYCERIN 0.4 MG SL SUBL: 0.4000 mg | SUBLINGUAL_TABLET | SUBLINGUAL | Status: DC | PRN

## 2023-02-25 MED ORDER — CARVEDILOL 12.5 MG PO TABS: 12.5000 mg | ORAL_TABLET | Freq: Two times a day (BID) | ORAL | Status: DC

## 2023-02-25 MED ORDER — CARVEDILOL 3.125 MG PO TABS: 6.2500 mg | ORAL_TABLET | Freq: Two times a day (BID) | ORAL | Status: DC

## 2023-02-25 MED ORDER — DEXTROSE 50 % IV SOLN: 25.0000 g | INTRAVENOUS | Status: AC

## 2023-02-25 MED ORDER — DEXTROSE 50 % IV SOLN
INTRAVENOUS | Status: AC
  Administered 2023-02-25: 25 g via INTRAVENOUS
  Filled 2023-02-25: qty 50

## 2023-02-25 MED ORDER — CARVEDILOL 3.125 MG PO TABS
3.1250 mg | ORAL_TABLET | Freq: Two times a day (BID) | ORAL | Status: DC
  Administered 2023-02-26: 3.125 mg via ORAL
  Filled 2023-02-25: qty 1

## 2023-02-25 MED ORDER — FREE WATER
300.0000 mL | Status: DC
  Administered 2023-02-25 (×5): 300 mL

## 2023-02-25 NOTE — Progress Notes (Signed)
Speech Language Pathology Treatment: Dysphagia  Patient Details Name: Xavier [name] MRN: [id number] DOB: 1941/06/03 Today's Date: 02/25/2023 Time: 0840-0900 SLP Time Calculation (min) (ACUTE ONLY): 20 min  Assessment / Plan / Recommendation Clinical Impression  Patient seen for f/u diagnostic po trials. Patient woken from sleep. Remains confused, impulsive, attempting to remove mittens, requiring moderate-max cues to attend to feeding task. He was able to consume thin liquids via cup and straw without overt indication of aspiration but is impulsive with intake increasing aspiration risk. With solids (puree), significant oral holding noted requiring moderate-max verbal and tactile cueing to orally transit bolus and initiate a swallow. At this time, cognition is appearing to be primary barrier to progressing with po intake at this time. Recommend NPO except small sips of H20/ice chips after oral care PRN. Prognosis for ability to advance diet good with decreased confusion. Will continue to f/u.    HPI HPI: Patient is an 82 y.o. male with PMH: type 2 diabetes mellitus, prostate cancer, and essential hypertension. He presented to the hospital on 02/17/2023 after syncopal episode with associated foaming at the mouth and facial asymmetry. [name] reported to ED staff that patient had not been himself over past few months with significant drowsiness and hypersomnia. He was diagnosed with Covid by his PCP on afternoon of admission and while in the car on way home, left side of face began to droop and he lost consciousness and made a regurgitating sound. 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 He underwent mechanical thrombectomy on 8/9. He was intubated for procedure, extubated aftewards but had to be reintubated on 8/11 due to hypoxia. He was extubated on 8/13. [name] 8/14.      SLP Plan  Continue with current plan of care      Recommendations for  follow up therapy are one component of a multi-disciplinary discharge planning process, led by the attending physician.  Recommendations may be updated based on patient status, additional functional criteria and insurance authorization.    Recommendations  Diet recommendations: NPO (except ice chips/water after oral care) Medication Administration: Via alternative means                  Staff/trained caregiver to provide oral care;Oral care BID;Oral care prior to ice chip/H20   Frequent or constant Supervision/Assistance Dysphagia, unspecified (R13.10)     Continue with current plan of care    [facility] MA, CCC-SLP  Xavier [name]  02/25/2023, 9:04 AM

## 2023-02-25 NOTE — Progress Notes (Addendum)
  Discussed EKG findings with on-call cardiology Dr. Brayton Layman.  He noticed some slight ST changes at lead II.  Recommended to continue to monitor for development of any chest pain and recommended to trend troponin. If patient develop any significant chest pain or troponin trends to go up need to let cardiology know again. -Patient has been admitted earlier today for acute right MCA and PCA infarction currently on Eliquis 2.5 mg twice daily and Brilinta 90 mg daily.   -Plan to continue Coreg 3.125 mg daily, Eliquis, Brilinta and Lipitor. -Continue to monitor for development of any chest pain, trend troponin and continue cardiac monitoring.   Tereasa Coop, MD [facility] 02/25/2023, 9:37 PM

## 2023-02-25 NOTE — Progress Notes (Signed)
PROGRESS NOTE  Xavier [name] WUJ:[id number] DOB: 1941-01-27   PCP: [name], MD  Patient is from: Home.  DOA: 02/17/2023 LOS: 8  Chief complaints Chief Complaint  Patient presents with   Code Stroke     Brief Narrative / Interim history: 82 year old M with PMH of DM-2, CKD-3B and HTN presented after acute onset left-sided weakness, facial droop and aphasia.  Code stroke activated in ED.  CT head with right MCA and PCA infarcts.  CT angio head and neck with occlusion of right MCA and PCA segments.  Received TNK and he was intubated for airway protection. underwent revascularization and stenting of occluded right MCA and revascularization of occluded right PCA without stenting. Notably, he was diagnosed with COVID and flu on same day as strokelike symptoms occurred.   Hospital course complicated by acute hypoxic respiratory failure in the setting of aspiration pneumonia, paroxysmal A-fib with RVR, delirium, AKI and urinary retention.  Respiratory failure, A-fib and AKI improved.  Nephrology signed off.  Eventually, he was extubated on 8/13 and transferred to hospitalist service on 8/17 while on 23 L / 80% by HFNC.     Subjective: Seen and examined earlier this morning.  No major events overnight of this morning.  He is awake and alert and oriented to self, place and person.  A little restless but not agitated.  He has bilateral mittens.  Responds no to pain or shortness of breath.  Follows commands.  Objective: Vitals:   02/25/23 0403 02/25/23 0733 02/25/23 0739 02/25/23 1124  BP:      Pulse:  63    Resp:  (!) 23    Temp:   99 F (37.2 C) 98.9 F (37.2 C)  TempSrc:   Oral Oral  SpO2:  100%    Weight: 79.4 kg     Height:        Examination:  GENERAL: No apparent distress.  Nontoxic. HEENT: MMM.  Vision and hearing grossly intact.  NECK: Supple.  No apparent JVD.  RESP:  No IWOB.  Fair aeration bilaterally. CVS:  RRR. Heart sounds normal.  ABD/GI/GU: BS+. Abd soft,  NTND.  Foley and rectal tube in place. MSK/EXT:  Moves extremities. No apparent deformity. No edema.  SKIN: no apparent skin lesion or wound NEURO: Awake, alert and oriented some of, place and person but not time.  LLE weakness. PSYCH: A little restless but no agitation.  Procedures:  8/9-TNK, thrombectomy and intubation for airway protection.  Antibiotic started for aspiration 8/11-extubated but reintubated due to respiratory failure. 8/13-extubated.  Microbiology summarized: COVID-19 PCR nonreactive MRSA PCR nonreactive Full RVP nonreactive Respiratory culture with normal flora Blood cultures NGTD  Assessment and plan: Principal Problem:   Acute ischemic stroke [facility]) Active Problems:   Acute respiratory failure with hypoxia (HCC)   Pneumonia due to COVID-19 virus   Aspiration pneumonia of both lungs due to gastric secretions (HCC)   Influenza  Acute right MCA and PCA infarcts s/p TNK and revascularization with stent in MCA M1 segment and revascularization of occluded right PCA without stenting.  Notable left leg weakness on exam. -Continue Brilinta, Eliquis and Lipitor. -Aggressive risk reduction.  Goal SBP <160. -PT/OT   Acute metabolic encephalopathy/delirium: Improving.  He is awake, alert and oriented to self, place and person but restless.  No agitation at the moment.  Off Precedex drip. -Reorientation and delirium precaution -Minimize sedating medications   Paroxysmal atrial fibrillation with RVR: Rate controlled off Cardizem drip. -Continue Coreg and Eliquis. -Optimize  electrolytes   Acute hypoxic respiratory failure, improving.  Currently on 25 L / 80% FiO2.  No history of COPD or asthma. Aspiration pneumonia versus pneumonitis: COVID, full RVP and respiratory culture unrevealing. -S/p intubation and extubation. -Completed 7 days of Zosyn on 8/17. -Wean oxygen as able.  Currently on 25 L / 80% heated high flow nasal cannula -Continue DuoNeb as needed -Pulmonary  toilet  Urinary retention: Was getting around 600-700 UOP with I/O cath in ICU. -Started on doxazosin -Continue Foley catheter for now   AKI on CKD-3B: Not oliguric.  Multifactorial including contrast, Zosyn and urinary tension: Renal US without acute finding.  Cr slightly up today. Recent Labs    02/20/23 0032 02/21/23 0013 02/21/23 0024 02/21/23 0414 02/21/23 1001 02/21/23 1754 02/22/23 0615 02/23/23 0051 02/24/23 0635 02/25/23 0229  BUN 58* 89* 88* 88* 92* 91* 96* 97* 86* 87*  CREATININE 3.40* 3.87* 3.88* 3.81* 3.86* 3.58* 3.38* 3.30* 2.46* 2.74*  -Completed course of Zosyn today -Continue Foley catheter for now -Avoid nephrotoxic meds -Continue monitoring -Nephrology signed off  Uncontrolled IDDM-2 with hyperglycemia and hyperlipidemia: Currently on tube feed.  A1c 9.0%.  Seems like he is on [place] at home. Recent Labs  Lab 02/24/23 1920 02/24/23 2314 02/25/23 0309 02/25/23 0735 02/25/23 1121  GLUCAP 180* 110* 141* 181* 288*  -Continue resistant scale every 4 hours -Continue NovoLog 5 units every 4 hours -Continue Semglee 30 units daily -Continue Lipitor -Further adjustment as appropriate.  Normocytic anemia: H&H stable. Recent Labs    02/18/23 0344 02/19/23 0301 02/19/23 1802 02/20/23 0029 02/20/23 0032 02/21/23 0045 02/22/23 0615 02/23/23 0051 02/24/23 0635 02/25/23 0229  HGB 11.4* 9.8* 9.2* 8.8* 9.5* 8.2* 9.7* 9.0* 9.6* 9.2*  -Continue monitoring   Labile blood pressure/hypertension: Improved. -Continue amlodipine and Coreg  Nausea with vomiting: Seems to have resolved -Antiemetics as needed   Hypernatremia: Na 154.  -Increase free water to 300 cc every 4 hours   Hematuria: Resolved.   Diarrhea?  Still with rectal tube -Discontinue if no further diarrhea   Physical deconditioning/disposition:  -PT/OT -LTAC?  Leukocytosis: Likely demargination. -Antibiotics as above -Continue monitoring  Nutrition/inadequate oral  intake/dysphagia: Remains n.p.o. Follow SLP eval and recommendation Body mass index is 22.47 kg/m. Nutrition Problem: Inadequate oral intake Etiology: acute illness Signs/Symptoms: NPO status Interventions: Tube feeding   DVT prophylaxis:  apixaban (ELIQUIS) tablet 2.5 mg Start: 02/23/23 1030 Place and maintain sequential compression device Start: 02/17/23 2140 apixaban (ELIQUIS) tablet 2.5 mg  Code Status: Full code Family Communication: Updated patient's wife over the phone Level of care: Progressive Status is: Inpatient Remains inpatient appropriate because: Acute respiratory failure with significant oxygen requirement, dysphagia and acute CVA   Final disposition: LTAC? Consultants:  Neurology Nephrology Pulmonology  55 minutes with more than 50% spent in reviewing records, counseling patient/family and coordinating care.   Sch Meds:  Scheduled Meds:  acetaminophen  650 mg Oral Q6H   Or   acetaminophen (TYLENOL) oral liquid 160 mg/5 mL  650 mg Per Tube Q6H   Or   acetaminophen  650 mg Rectal Q6H   amLODipine  10 mg Per Tube Daily   apixaban  2.5 mg Per Tube BID   atorvastatin  80 mg Per Tube Daily   carvedilol  25 mg Oral BID WC   Chlorhexidine Gluconate Cloth  6 each Topical Q0600   docusate  100 mg Per Tube BID   doxazosin  1 mg Per Tube Daily   free water  300  mL Per Tube Q4H   insulin aspart  0-20 Units Subcutaneous Q4H   insulin aspart  5 Units Subcutaneous Q4H   insulin glargine-yfgn  30 Units Subcutaneous Daily   mouth rinse  15 mL Mouth Rinse 4 times per day   QUEtiapine  25 mg Per Tube QHS   ticagrelor  90 mg Per Tube BID   Continuous Infusions:  sodium chloride Stopped (02/20/23 2337)   feeding supplement (GLUCERNA 1.5 CAL) 55 mL/hr at 02/25/23 0400   piperacillin-tazobactam (ZOSYN)  IV 3.375 g (02/25/23 0559)   PRN Meds:.iohexol, ipratropium-albuterol, ondansetron (ZOFRAN) IV, mouth rinse, polyethylene glycol,  senna-docusate  Antimicrobials: Anti-infectives (From admission, onward)    Start     Dose/Rate Route Frequency Ordered Stop   02/24/23 2000  piperacillin-tazobactam (ZOSYN) IVPB 3.375 g        3.375 g 12.5 mL/hr over 240 Minutes Intravenous Every 8 hours 02/24/23 1305 02/25/23 2159   02/20/23 1000  piperacillin-tazobactam (ZOSYN) IVPB 2.25 g  Status:  Discontinued        2.25 g 100 mL/hr over 30 Minutes Intravenous Every 6 hours 02/20/23 0900 02/24/23 1305   02/19/23 2200  oseltamivir (TAMIFLU) capsule 30 mg  Status:  Discontinued        30 mg Per Tube Daily 02/19/23 0928 02/20/23 0908   02/18/23 1000  remdesivir 100 mg in sodium chloride 0.9 % 100 mL IVPB       Placed in "Followed by" Linked Group   100 mg 200 mL/hr over 30 Minutes Intravenous Daily 02/17/23 2123 02/19/23 1004   02/17/23 2330  remdesivir 200 mg in sodium chloride 0.9% 250 mL IVPB       Placed in "Followed by" Linked Group   200 mg 580 mL/hr over 30 Minutes Intravenous Once 02/17/23 2123 02/18/23 0031   02/17/23 2215  oseltamivir (TAMIFLU) capsule 75 mg  Status:  Discontinued        75 mg Per Tube 2 times daily 02/17/23 2120 02/17/23 2124   02/17/23 2215  oseltamivir (TAMIFLU) capsule 30 mg  Status:  Discontinued        30 mg Per Tube 2 times daily 02/17/23 2124 02/19/23 0928   02/17/23 2200  cefTRIAXone (ROCEPHIN) 2 g in sodium chloride 0.9 % 100 mL IVPB  Status:  Discontinued        2 g 200 mL/hr over 30 Minutes Intravenous Every 24 hours 02/17/23 2050 02/20/23 0900   02/17/23 2200  azithromycin (ZITHROMAX) 500 mg in sodium chloride 0.9 % 250 mL IVPB  Status:  Discontinued        500 mg 250 mL/hr over 60 Minutes Intravenous Every 24 hours 02/17/23 2050 02/21/23 1033        I have personally reviewed the following labs and images: CBC: Recent Labs  Lab 02/20/23 0032 02/21/23 0045 02/22/23 0615 02/23/23 0051 02/24/23 0635 02/25/23 0229  WBC 10.4  --  13.1* 9.7 13.0* 18.3*  HGB 9.5* 8.2* 9.7* 9.0* 9.6*  9.2*  HCT 28.7* 24.0* 29.4* 28.0* 30.8* 30.4*  MCV 81.3  --  80.3 82.8 83.0 85.4  PLT 151  --  202 188 226 248   BMP &GFR Recent Labs  Lab 02/19/23 0301 02/19/23 1802 02/21/23 1754 02/22/23 0615 02/23/23 0051 02/24/23 0635 02/25/23 0229  NA 135   < > 140 143 145 149* 154*  K 4.4   < > 4.0 3.8 4.1 3.4* 3.7  CL 106   < > 107 105 107 110 116*  CO2  20*   < > 20* 22 22 25 24   GLUCOSE 195*   < > 94 113* 249* 186* 166*  BUN 47*   < > 91* 96* 97* 86* 87*  CREATININE 3.03*   < > 3.58* 3.38* 3.30* 2.46* 2.74*  CALCIUM 7.8*   < > 9.0 9.3 8.5* 8.9 8.9  MG 2.6*  --   --  3.3* 3.2* 3.3*  --   PHOS 3.4  --   --   --  4.7* 3.6 3.8   < > = values in this interval not displayed.   Estimated Creatinine Clearance: 23.3 mL/min (A) (by C-G formula based on SCr of 2.74 mg/dL (H)). Liver & Pancreas: Recent Labs  Lab 02/23/23 0051 02/24/23 0635 02/25/23 0229  ALBUMIN 3.0* 2.9* 2.8*   No results for input(s): "LIPASE", "AMYLASE" in the last 168 hours. No results for input(s): "AMMONIA" in the last 168 hours. Diabetic: No results for input(s): "HGBA1C" in the last 72 hours. Recent Labs  Lab 02/24/23 1920 02/24/23 2314 02/25/23 0309 02/25/23 0735 02/25/23 1121  GLUCAP 180* 110* 141* 181* 288*   Cardiac Enzymes: No results for input(s): "CKTOTAL", "CKMB", "CKMBINDEX", "TROPONINI" in the last 168 hours. No results for input(s): "PROBNP" in the last 8760 hours. Coagulation Profile: No results for input(s): "INR", "PROTIME" in the last 168 hours. Thyroid Function Tests: No results for input(s): "TSH", "T4TOTAL", "FREET4", "T3FREE", "THYROIDAB" in the last 72 hours. Lipid Profile: Recent Labs    02/24/23 0635  TRIG 148   Anemia Panel: No results for input(s): "VITAMINB12", "FOLATE", "FERRITIN", "TIBC", "IRON", "RETICCTPCT" in the last 72 hours. Urine analysis:    Component Value Date/Time   COLORURINE YELLOW 02/18/2023 0137   APPEARANCEUR CLEAR 02/18/2023 0137   LABSPEC >1.046 (H)  02/18/2023 0137   PHURINE 5.0 02/18/2023 0137   GLUCOSEU >=500 (A) 02/18/2023 0137   HGBUR NEGATIVE 02/18/2023 0137   BILIRUBINUR NEGATIVE 02/18/2023 0137   KETONESUR NEGATIVE 02/18/2023 0137   PROTEINUR 30 (A) 02/18/2023 0137   UROBILINOGEN 1.0 09/28/2014 1645   NITRITE NEGATIVE 02/18/2023 0137   LEUKOCYTESUR NEGATIVE 02/18/2023 0137   Sepsis Labs: Invalid input(s): "PROCALCITONIN", "LACTICIDVEN"  Microbiology: Recent Results (from the past 240 hour(s))  SARS Coronavirus 2 by RT PCR (hospital order, performed in [facility] hospital lab) *cepheid single result test*     Status: None   Collection Time: 02/17/23  3:32 PM  Result Value Ref Range Status   SARS Coronavirus 2 by RT PCR NEGATIVE NEGATIVE Final    Comment: Performed at [facility], [address], [place], [place] [address]  Culture, Respiratory w Gram Stain     Status: None   Collection Time: 02/17/23  8:36 PM   Specimen: Tracheal Aspirate; Respiratory  Result Value Ref Range Status   Specimen Description TRACHEAL ASPIRATE  Final   Special Requests NONE  Final   Gram Stain   Final    FEW WBC PRESENT, PREDOMINANTLY PMN RARE GRAM POSITIVE COCCI    Culture   Final    Normal respiratory flora-no Staph aureus or Pseudomonas seen Performed at [facility], [address], [place], [place] [address]    Report Status 02/21/2023 FINAL  Final  Respiratory (~20 pathogens) panel by PCR     Status: None   Collection Time: 02/17/23  8:56 PM   Specimen: Tracheal Aspirate; Respiratory  Result Value Ref Range Status   Adenovirus NOT DETECTED NOT DETECTED Final   Coronavirus 229E NOT DETECTED NOT  DETECTED Final    Comment: (NOTE) The Coronavirus on the Respiratory Panel, DOES NOT test for the novel  Coronavirus (2019 nCoV)    Coronavirus HKU1 NOT DETECTED NOT DETECTED Final   Coronavirus NL63 NOT DETECTED NOT DETECTED Final   Coronavirus OC43 NOT DETECTED NOT DETECTED Final   Metapneumovirus NOT DETECTED NOT  DETECTED Final   Rhinovirus / Enterovirus NOT DETECTED NOT DETECTED Final   Influenza A NOT DETECTED NOT DETECTED Final   Influenza B NOT DETECTED NOT DETECTED Final   Parainfluenza Virus 1 NOT DETECTED NOT DETECTED Final   Parainfluenza Virus 2 NOT DETECTED NOT DETECTED Final   Parainfluenza Virus 3 NOT DETECTED NOT DETECTED Final   Parainfluenza Virus 4 NOT DETECTED NOT DETECTED Final   Respiratory Syncytial Virus NOT DETECTED NOT DETECTED Final   Bordetella pertussis NOT DETECTED NOT DETECTED Final   Bordetella Parapertussis NOT DETECTED NOT DETECTED Final   Chlamydophila pneumoniae NOT DETECTED NOT DETECTED Final   Mycoplasma pneumoniae NOT DETECTED NOT DETECTED Final    Comment: Performed at [facility], [address] [place], [place] [address]  MRSA Next Gen by PCR, Nasal     Status: None   Collection Time: 02/17/23  9:00 PM  Result Value Ref Range Status   MRSA by PCR Next Gen NOT DETECTED NOT DETECTED Final    Comment: (NOTE) The GeneXpert MRSA Assay (FDA approved for NASAL specimens only), is one component of a comprehensive MRSA colonization surveillance program. It is not intended to diagnose MRSA infection nor to guide or monitor treatment for MRSA infections. Test performance is not FDA approved in patients less than 66 years old. Performed at [facility], [address] [place], [place] [address]   Culture, blood (Routine X 2) w Reflex to ID Panel     Status: None   Collection Time: 02/17/23 11:36 PM   Specimen: BLOOD RIGHT HAND  Result Value Ref Range Status   Specimen Description BLOOD RIGHT HAND  Final   Special Requests   Final    BOTTLES DRAWN AEROBIC AND ANAEROBIC Blood Culture adequate volume   Culture   Final    NO GROWTH 5 DAYS Performed at [facility], [address], [place], [place] [address]    Report Status 02/22/2023 FINAL  Final  Culture, blood (Routine X 2) w Reflex to ID Panel     Status: None   Collection Time: 02/17/23  11:36 PM   Specimen: BLOOD RIGHT HAND  Result Value Ref Range Status   Specimen Description BLOOD RIGHT HAND  Final   Special Requests   Final    BOTTLES DRAWN AEROBIC AND ANAEROBIC Blood Culture adequate volume   Culture   Final    NO GROWTH 5 DAYS Performed at [facility], [address] [place], [place] [address]    Report Status 02/22/2023 FINAL  Final  Culture, Respiratory w Gram Stain     Status: None   Collection Time: 02/20/23  9:21 AM   Specimen: Tracheal Aspirate; Respiratory  Result Value Ref Range Status   Specimen Description TRACHEAL ASPIRATE  Final   Special Requests NONE  Final   Gram Stain NO WBC SEEN NO ORGANISMS SEEN   Final   Culture   Final    RARE Normal respiratory flora-no Staph aureus or Pseudomonas seen Performed at [facility], [address], [place], [place] [address]    Report Status 02/22/2023 FINAL  Final  SARS  Coronavirus 2 by RT PCR (hospital order, performed in [facility] hospital lab) *cepheid single result test* Anterior Nasal Swab     Status: None   Collection Time: 02/20/23 10:35 AM   Specimen: Anterior Nasal Swab  Result Value Ref Range Status   SARS Coronavirus 2 by RT PCR NEGATIVE NEGATIVE Final    Comment: Performed at [facility], [address], [place], [place] [address]  Respiratory (~20 pathogens) panel by PCR     Status: None   Collection Time: 02/20/23 10:35 AM   Specimen: Nasopharyngeal Swab; Respiratory  Result Value Ref Range Status   Adenovirus NOT DETECTED NOT DETECTED Final   Coronavirus 229E NOT DETECTED NOT DETECTED Final    Comment: (NOTE) The Coronavirus on the Respiratory Panel, DOES NOT test for the novel  Coronavirus (2019 nCoV)    Coronavirus HKU1 NOT DETECTED NOT DETECTED Final   Coronavirus NL63 NOT DETECTED NOT DETECTED Final   Coronavirus OC43 NOT DETECTED NOT DETECTED Final   Metapneumovirus NOT DETECTED NOT DETECTED Final   Rhinovirus / Enterovirus NOT DETECTED NOT DETECTED Final    Influenza A NOT DETECTED NOT DETECTED Final   Influenza B NOT DETECTED NOT DETECTED Final   Parainfluenza Virus 1 NOT DETECTED NOT DETECTED Final   Parainfluenza Virus 2 NOT DETECTED NOT DETECTED Final   Parainfluenza Virus 3 NOT DETECTED NOT DETECTED Final   Parainfluenza Virus 4 NOT DETECTED NOT DETECTED Final   Respiratory Syncytial Virus NOT DETECTED NOT DETECTED Final   Bordetella pertussis NOT DETECTED NOT DETECTED Final   Bordetella Parapertussis NOT DETECTED NOT DETECTED Final   Chlamydophila pneumoniae NOT DETECTED NOT DETECTED Final   Mycoplasma pneumoniae NOT DETECTED NOT DETECTED Final    Comment: Performed at [facility], [address] [place], [place] [address]    Radiology Studies: DG Abd 1 View  Result Date: 02/24/2023 CLINICAL DATA:  Ileus EXAM: ABDOMEN - 1 VIEW COMPARISON:  02/22/2023 FINDINGS: Nasoenteric feeding tube tip overlies the expected gastric antrum. Normal abdominal gas pattern. No gross free intraperitoneal gas. Asymmetric airspace infiltrate noted within the visualized right lung base, improved since prior examination. No pleural effusion. No nephro or urolithiasis. Osseous structures are age-appropriate. IMPRESSION: 1. Nasoenteric feeding tube tip overlies the expected gastric antrum. 2. Improved asymmetric airspace infiltrate within the visualized right lung base. Electronically Signed   By: Helyn Numbers M.D.   On: 02/24/2023 23:15      Tysheena Ginzburg T. Emmerson Taddei [facility]  If 7PM-7AM, please contact night-coverage [email or web address] 02/25/2023, 12:05 PM

## 2023-02-25 NOTE — Progress Notes (Signed)
eLink Physician-Brief Progress Note Patient Name: Xavier [name] DOB: September 22, 1940 MRN: [id number]   Date of Service  02/25/2023  HPI/Events of Note  KUB reviewed.  eICU Interventions          Migdalia Dk 02/25/2023, 12:38 AM

## 2023-02-25 NOTE — Progress Notes (Signed)
Bedside nurse reported that at bedside daily there is some ST elevation in lead II.   -Obtain EKG and bedside reviewed with no evidence of ST elevation.  It showed sinus bradycardia heart rate 52.  Patient has been admitted for A-fib RVR rate was controlled with Cardizem drip and then transition to Coreg 12.5 mg twice daily.  Currently rate is controlled.  Given EKG showed sinus bradycardia I have decreased the dose of Cardizem to 3.125 mg. -Patient does not have any chest pain now.  As bedside alarm notified ST elevation in lead II checking troponin.  Continue to monitor for development of any chest pain.   Tereasa Coop, MD [facility] 02/25/2023, 8:39 PM

## 2023-02-26 DIAGNOSIS — I480 Paroxysmal atrial fibrillation: Secondary | ICD-10-CM | POA: Diagnosis not present

## 2023-02-26 DIAGNOSIS — I5031 Acute diastolic (congestive) heart failure: Secondary | ICD-10-CM

## 2023-02-26 DIAGNOSIS — I639 Cerebral infarction, unspecified: Secondary | ICD-10-CM | POA: Diagnosis not present

## 2023-02-26 DIAGNOSIS — Z515 Encounter for palliative care: Secondary | ICD-10-CM | POA: Diagnosis not present

## 2023-02-26 DIAGNOSIS — Z7189 Other specified counseling: Secondary | ICD-10-CM | POA: Diagnosis not present

## 2023-02-26 DIAGNOSIS — J690 Pneumonitis due to inhalation of food and vomit: Secondary | ICD-10-CM | POA: Diagnosis not present

## 2023-02-26 DIAGNOSIS — J9601 Acute respiratory failure with hypoxia: Secondary | ICD-10-CM | POA: Diagnosis not present

## 2023-02-26 LAB — GLUCOSE, CAPILLARY
Glucose-Capillary: 146 mg/dL — ABNORMAL HIGH (ref 70–99)
Glucose-Capillary: 157 mg/dL — ABNORMAL HIGH (ref 70–99)
Glucose-Capillary: 197 mg/dL — ABNORMAL HIGH (ref 70–99)
Glucose-Capillary: 226 mg/dL — ABNORMAL HIGH (ref 70–99)
Glucose-Capillary: 250 mg/dL — ABNORMAL HIGH (ref 70–99)
Glucose-Capillary: 99 mg/dL (ref 70–99)

## 2023-02-26 LAB — RENAL FUNCTION PANEL
Albumin: 2.2 g/dL — ABNORMAL LOW (ref 3.5–5.0)
Anion gap: 11 (ref 5–15)
BUN: 105 mg/dL — ABNORMAL HIGH (ref 8–23)
CO2: 22 mmol/L (ref 22–32)
Calcium: 8.2 mg/dL — ABNORMAL LOW (ref 8.9–10.3)
Chloride: 115 mmol/L — ABNORMAL HIGH (ref 98–111)
Creatinine, Ser: 2.63 mg/dL — ABNORMAL HIGH (ref 0.61–1.24)
GFR, Estimated: 24 mL/min — ABNORMAL LOW (ref >60)
Glucose, Bld: 255 mg/dL — ABNORMAL HIGH (ref 70–99)
Phosphorus: 5 mg/dL — ABNORMAL HIGH (ref 2.5–4.6)
Potassium: 3.4 mmol/L — ABNORMAL LOW (ref 3.5–5.1)
Sodium: 148 mmol/L — ABNORMAL HIGH (ref 135–145)

## 2023-02-26 LAB — T4, FREE: Free T4: 0.83 ng/dL (ref 0.61–1.12)

## 2023-02-26 LAB — TSH: TSH: 4.786 u[IU]/mL — ABNORMAL HIGH (ref 0.350–4.500)

## 2023-02-26 LAB — CBC
HCT: 30 % — ABNORMAL LOW (ref 39.0–52.0)
Hemoglobin: 9.1 g/dL — ABNORMAL LOW (ref 13.0–17.0)
MCH: 26.4 pg (ref 26.0–34.0)
MCHC: 30.3 g/dL (ref 30.0–36.0)
MCV: 87 fL (ref 80.0–100.0)
Platelets: 280 10*3/uL (ref 150–400)
RBC: 3.45 MIL/uL — ABNORMAL LOW (ref 4.22–5.81)
RDW: 15 % (ref 11.5–15.5)
WBC: 22 10*3/uL — ABNORMAL HIGH (ref 4.0–10.5)
nRBC: 0 % (ref 0.0–0.2)

## 2023-02-26 LAB — COMPREHENSIVE METABOLIC PANEL
ALT: 51 U/L — ABNORMAL HIGH (ref 0–44)
AST: 37 U/L (ref 15–41)
Albumin: 2.6 g/dL — ABNORMAL LOW (ref 3.5–5.0)
Alkaline Phosphatase: 75 U/L (ref 38–126)
Anion gap: 13 (ref 5–15)
BUN: 102 mg/dL — ABNORMAL HIGH (ref 8–23)
CO2: 24 mmol/L (ref 22–32)
Calcium: 8.6 mg/dL — ABNORMAL LOW (ref 8.9–10.3)
Chloride: 116 mmol/L — ABNORMAL HIGH (ref 98–111)
Creatinine, Ser: 2.87 mg/dL — ABNORMAL HIGH (ref 0.61–1.24)
GFR, Estimated: 21 mL/min — ABNORMAL LOW (ref >60)
Glucose, Bld: 96 mg/dL (ref 70–99)
Potassium: 3.6 mmol/L (ref 3.5–5.1)
Sodium: 153 mmol/L — ABNORMAL HIGH (ref 135–145)
Total Bilirubin: 0.6 mg/dL (ref 0.3–1.2)
Total Protein: 6.9 g/dL (ref 6.5–8.1)

## 2023-02-26 LAB — OSMOLALITY, URINE: Osmolality, Ur: 577 mOsm/kg (ref 300–900)

## 2023-02-26 LAB — PHOSPHORUS: Phosphorus: 4.4 mg/dL (ref 2.5–4.6)

## 2023-02-26 LAB — TROPONIN I (HIGH SENSITIVITY): Troponin I (High Sensitivity): 146 ng/L (ref <18)

## 2023-02-26 LAB — SODIUM, URINE, RANDOM: Sodium, Ur: <10 mmol/L

## 2023-02-26 LAB — BRAIN NATRIURETIC PEPTIDE: B Natriuretic Peptide: 834 pg/mL — ABNORMAL HIGH (ref 0.0–100.0)

## 2023-02-26 LAB — OSMOLALITY: Osmolality: 359 mOsm/kg (ref 275–295)

## 2023-02-26 LAB — MAGNESIUM: Magnesium: 3.5 mg/dL — ABNORMAL HIGH (ref 1.7–2.4)

## 2023-02-26 MED ORDER — AMIODARONE HCL IN DEXTROSE 360-4.14 MG/200ML-% IV SOLN
30.0000 mg/h | INTRAVENOUS | Status: DC
  Administered 2023-02-26 – 2023-03-01 (×6): 30 mg/h via INTRAVENOUS
  Filled 2023-02-26 (×6): qty 200

## 2023-02-26 MED ORDER — ACETAMINOPHEN 650 MG RE SUPP: 650.0000 mg | Freq: Four times a day (QID) | RECTAL | Status: DC

## 2023-02-26 MED ORDER — FUROSEMIDE 10 MG/ML IJ SOLN
40.0000 mg | Freq: Once | INTRAMUSCULAR | Status: AC
  Administered 2023-02-26: 40 mg via INTRAVENOUS
  Filled 2023-02-26: qty 4

## 2023-02-26 MED ORDER — ISOSORBIDE DINITRATE 20 MG PO TABS
20.0000 mg | ORAL_TABLET | Freq: Two times a day (BID) | ORAL | Status: DC
  Administered 2023-02-26: 20 mg
  Filled 2023-02-26 (×2): qty 1

## 2023-02-26 MED ORDER — METOPROLOL TARTRATE 5 MG/5ML IV SOLN
2.5000 mg | INTRAVENOUS | Status: DC | PRN
  Administered 2023-02-26: 2.5 mg via INTRAVENOUS
  Filled 2023-02-26: qty 5

## 2023-02-26 MED ORDER — HYDRALAZINE HCL 50 MG PO TABS
25.0000 mg | ORAL_TABLET | Freq: Three times a day (TID) | ORAL | Status: DC
  Administered 2023-02-26: 25 mg
  Filled 2023-02-26: qty 1

## 2023-02-26 MED ORDER — DILTIAZEM HCL 30 MG PO TABS: 30.0000 mg | ORAL_TABLET | Freq: Four times a day (QID) | ORAL | Status: DC | PRN

## 2023-02-26 MED ORDER — METOPROLOL TARTRATE 25 MG PO TABS
25.0000 mg | ORAL_TABLET | Freq: Two times a day (BID) | ORAL | Status: DC
  Administered 2023-02-26: 25 mg
  Filled 2023-02-26: qty 1

## 2023-02-26 MED ORDER — AMIODARONE HCL IN DEXTROSE 360-4.14 MG/200ML-% IV SOLN: 30.0000 mg/h | INTRAVENOUS | Status: DC

## 2023-02-26 MED ORDER — FREE WATER
300.0000 mL | Status: AC
  Administered 2023-02-26 (×4): 300 mL

## 2023-02-26 MED ORDER — POTASSIUM CHLORIDE 20 MEQ PO PACK
60.0000 meq | PACK | Freq: Once | ORAL | Status: AC
  Administered 2023-02-26: 60 meq
  Filled 2023-02-26: qty 3

## 2023-02-26 MED ORDER — ACETAMINOPHEN 325 MG PO TABS
650.0000 mg | ORAL_TABLET | Freq: Four times a day (QID) | ORAL | Status: DC
  Administered 2023-02-26 – 2023-03-01 (×11): 650 mg
  Filled 2023-02-26 (×12): qty 2

## 2023-02-26 NOTE — Consult Note (Signed)
Cardiology Consultation   Patient ID: HOLMAN MARATEA MRN: [id number]; DOB: [birth year]  Admit date: 02/17/2023 Date of Consult: 02/26/2023  PCP:  [name], MD   [facility] Providers Cardiologist:  New PV Cardiologist:  Lorine Bears, MD       Patient Profile:   Xavier [name] is a 82 y.o. male with a hx of PAD, DM2, CKD, HLD, HTN, admitted 02/17/23 with CVA who is being seen 02/26/2023 for the evaluation of afib at the request of Dr Alanda Slim.  History of Present Illness:   Xavier [name] 82 yo male history of PAD, DM2, CKD, HLD, HTN, admitted 02/17/23 with left sided weakness. CT imaging was obtained revealing acute right MCA and PCA territory infarct involving portions of the right insula and temporal lobe. TNKase was administered to patient due to LVO on vessel imaging and IR was subsequently activated for endovascular revascularization and stent to MCA M1. Cardiology consulted for issues with afib with RVR this admission.    Recent labs Na 153 K 3.6 BUN 102 Cr 2.87 Mg 3.5 WBC 22 HGb 9.1 Plt 280 BNP 834 TSH 4.7  Trop 140-->133-->146--> CXR R>L interstitial opacities likely edema EKG afib 120s  02/2023 echo: LVEF 55-60%, no WMAs, grade II dd, normal RV Past Medical History:  Diagnosis Date   Congenital ureterocele    Diabetes mellitus without complication (HCC)    Hypertension    Prostate cancer (HCC)     Past Surgical History:  Procedure Laterality Date   COLONOSCOPY W/ POLYPECTOMY     EYE SURGERY     IR ANGIO INTRA EXTRACRAN SEL COM CAROTID INNOMINATE UNI L MOD SED  02/17/2023   IR ANGIO VERTEBRAL SEL VERTEBRAL UNI L MOD SED  02/17/2023   IR CT HEAD LTD  02/17/2023   IR CT HEAD LTD  02/17/2023   IR PATIENT EVAL TECH 0-60 MINS  02/21/2023   IR PERCUTANEOUS ART THROMBECTOMY/INFUSION INTRACRANIAL INC DIAG ANGIO  02/17/2023   RADIOLOGY WITH ANESTHESIA N/A 02/17/2023   Procedure: IR WITH ANESTHESIA;  Surgeon: Radiologist, Medication, MD;  Location: [facility] OR;  Service:  Radiology;  Laterality: N/A;      Inpatient Medications: Scheduled Meds:  acetaminophen  650 mg Per Tube Q6H   Or   acetaminophen  650 mg Rectal Q6H   apixaban  2.5 mg Per Tube BID   atorvastatin  80 mg Per Tube Daily   Chlorhexidine Gluconate Cloth  6 each Topical Q0600   docusate  100 mg Per Tube BID   doxazosin  1 mg Per Tube Daily   insulin aspart  0-20 Units Subcutaneous Q4H   metoCLOPramide (REGLAN) injection  5 mg Intravenous Q8H   metoprolol tartrate  25 mg Per Tube BID   mouth rinse  15 mL Mouth Rinse 4 times per day   potassium chloride  60 mEq Per Tube Once   QUEtiapine  25 mg Per Tube QHS   ticagrelor  90 mg Per Tube BID   Continuous Infusions:  sodium chloride Stopped (02/20/23 2337)   feeding supplement (GLUCERNA 1.5 CAL) 20 mL/hr at 02/26/23 1500   PRN Meds: diltiazem, iohexol, ipratropium-albuterol, metoprolol tartrate, nitroGLYCERIN, ondansetron (ZOFRAN) IV, mouth rinse, polyethylene glycol, senna-docusate  Allergies:    Allergies  Allergen Reactions   Benazepril Other (See Comments)   Ezetimibe Other (See Comments)   Losartan Potassium Other (See Comments)    Social History:   Social History   Socioeconomic History   Marital status: Married  Spouse name: Not on file   Number of children: Not on file   Years of education: Not on file   Highest education level: Not on file  Occupational History   Not on file  Tobacco Use   Smoking status: Former    Current packs/day: 0.00    Types: Cigarettes    Quit date: 12/10/1978    Years since quitting: 44.2   Smokeless tobacco: Never  Vaping Use   Vaping status: Never Used  Substance and Sexual Activity   Alcohol use: No   Drug use: No   Sexual activity: Yes  Other Topics Concern   Not on file  Social History Narrative   Not on file   Social Determinants of Health   Financial Resource Strain: Not on file  Food Insecurity: Not on file  Transportation Needs: Not on file  Physical Activity: Not  on file  Stress: Not on file  Social Connections: Not on file  Intimate Partner Violence: Not on file    Family History:    Family History  Problem Relation Age of Onset   Colon cancer Neg Hx      ROS:  Please see the history of present illness.   All other ROS reviewed and negative.     Physical Exam/Data:   Vitals:   02/26/23 1300 02/26/23 1400 02/26/23 1500 02/26/23 1557  BP: 115/66 (!) 101/58 112/64   Pulse: (!) 112     Resp: (!) 29 (!) 23 20   Temp:    98.8 F (37.1 C)  TempSrc:    Axillary  SpO2: 91% 93% 92%   Weight:      Height:        Intake/Output Summary (Last 24 hours) at 02/26/2023 1627 Last data filed at 02/26/2023 1500 Gross per 24 hour  Intake 338.8 ml  Output 1200 ml  Net -861.2 ml      02/25/2023    4:03 AM 02/24/2023    5:00 AM 02/23/2023    5:00 AM  Last 3 Weights  Weight (lbs) 175 lb 0.7 oz 178 lb 12.7 oz 178 lb 12.7 oz  Weight (kg) 79.4 kg 81.1 kg 81.1 kg     Body mass index is 22.47 kg/m.  General:  Well nourished, well developed, in no acute distress HEENT: normal Neck: no JVD Vascular: No carotid bruits; Distal pulses 2+ bilaterally Cardiac:  irerg, tachy Lungs:  crackles bilateral Abd: soft, nontender, no hepatomegaly  Ext: no edema Musculoskeletal:  No deformities, BUE and BLE strength normal and equal Skin: warm and dry  Neuro:  CNs 2-12 intact, no focal abnormalities noted Psych:  Normal affect     Laboratory Data:  High Sensitivity Troponin:   Recent Labs  Lab 02/25/23 2051 02/25/23 2157 02/26/23 0209  TROPONINIHS 140* 133* 146*     Chemistry Recent Labs  Lab 02/24/23 0635 02/25/23 0229 02/25/23 2157 02/26/23 0209 02/26/23 1455  NA 149* 154*  --  153* 148*  K 3.4* 3.7  --  3.6 3.4*  CL 110 116*  --  116* 115*  CO2 25 24  --  24 22  GLUCOSE 186* 166*  --  96 255*  BUN 86* 87*  --  102* 105*  CREATININE 2.46* 2.74*  --  2.87* 2.63*  CALCIUM 8.9 8.9  --  8.6* 8.2*  MG 3.3*  --  3.6* 3.5*  --   GFRNONAA  26* 22*  --  21* 24*  ANIONGAP 14 14  --  13 11    Recent Labs  Lab 02/25/23 0229 02/26/23 0209 02/26/23 1455  PROT  --  6.9  --   ALBUMIN 2.8* 2.6* 2.2*  AST  --  37  --   ALT  --  51*  --   ALKPHOS  --  75  --   BILITOT  --  0.6  --    Lipids  Recent Labs  Lab 02/24/23 0635  TRIG 148    Hematology Recent Labs  Lab 02/24/23 0635 02/25/23 0229 02/26/23 0209  WBC 13.0* 18.3* 22.0*  RBC 3.71* 3.56* 3.45*  HGB 9.6* 9.2* 9.1*  HCT 30.8* 30.4* 30.0*  MCV 83.0 85.4 87.0  MCH 25.9* 25.8* 26.4  MCHC 31.2 30.3 30.3  RDW 14.9 15.1 15.0  PLT 226 248 280   Thyroid  Recent Labs  Lab 02/26/23 0209  TSH 4.786*  FREET4 0.83    BNP Recent Labs  Lab 02/26/23 0209  BNP 834.0*    DDimer No results for input(s): "DDIMER" in the last 168 hours.   Radiology/Studies:  DG Abd 1 View  Result Date: 02/24/2023 CLINICAL DATA:  Ileus EXAM: ABDOMEN - 1 VIEW COMPARISON:  02/22/2023 FINDINGS: Nasoenteric feeding tube tip overlies the expected gastric antrum. Normal abdominal gas pattern. No gross free intraperitoneal gas. Asymmetric airspace infiltrate noted within the visualized right lung base, improved since prior examination. No pleural effusion. No nephro or urolithiasis. Osseous structures are age-appropriate. IMPRESSION: 1. Nasoenteric feeding tube tip overlies the expected gastric antrum. 2. Improved asymmetric airspace infiltrate within the visualized right lung base. Electronically Signed   By: Helyn Numbers M.D.   On: 02/24/2023 23:15   DG CHEST PORT 1 VIEW  Result Date: 02/23/2023 CLINICAL DATA:  Respiratory distress EXAM: PORTABLE CHEST 1 VIEW COMPARISON:  Chest radiograph 02/19/2023 FINDINGS: The endotracheal tube has been removed. The enteric catheter tip is below the field of view. The cardiomediastinal silhouette is stable. Extensive interstitial opacities are again seen throughout the right lung which are overall worsened since the prior study; however, there is improved  confluent perihilar opacity. There are increased interstitial markings in the left lung though to a lesser extent than on the right. There is a small right pleural effusion which appears increased since the prior study. There is no significant left effusion. There is no pneumothorax There is no acute osseous abnormality. IMPRESSION: 1. Right worse than left interstitial opacities are worsened compared to the prior study most likely reflecting pulmonary interstitial edema, with a small right pleural effusion which appears increased in size. 2. Confluent airspace opacity in the right perihilar region has improved. Electronically Signed   By: Lesia Hausen M.D.   On: 02/23/2023 14:39     Assessment and Plan:   1.PAF - new diagnosis this admission - has been paroxysmal during admission. Afib rates tend to be elvated, sinus rates in the 50s by EKG review On dilt drip for short duratoin. Transiently on coreg 25mg  bid, lowered to 3.125mg  and later changed to lopressor 25mg  bid.  - from tele review primarily post afib termination pauses up to 3.2 seconds and transient sinus brady to the 30s, then typically goes back into afib with elevated rates  - ongoing significant hypoxia to drive tachyarrythmias, elevated WBC on abx.  - I think will be difficult to control with av nodal agents due to intermittent post conversion pauses and sinus bradycardia. -CKD, HF, coronary artery calcifications. Limited antiarrhythmic options.  - d/c oral lopressor, stat amio gtt but  can do low rate just 30mg /hr. At outpatient f/u over time likely can come off amio.  - he is on eliquis 2.5mg  bid based on age and renal function    2. Acute HFpEF 02/2023 echo: LVEF 55-60%, no WMAs, grade II dd, normal RV CXR probable edema, BNP  - dose IV lasix 40mg  x 1 today, reassess in AM  3.CVA -per neuro      For questions or updates, please contact [facility] Please consult [email or web address] for contact info under     Signed, Dina Rich, MD  02/26/2023 4:27 PM

## 2023-02-26 NOTE — Progress Notes (Signed)
Speech Language Pathology Treatment: Dysphagia  Patient Details Name: Xavier [name] MRN: [id number] DOB: 01-Jan-1941 Today's Date: 02/26/2023 Time: 0865-7846 SLP Time Calculation (min) (ACUTE ONLY): 14 min  Assessment / Plan / Recommendation Clinical Impression  Pt alert and attentive, oriented but a bit perseverative. Has constant restless/involuntary movement including abnormal breathing pattern which may be related to occasional hiccoughs. Pt is eager to drink, but when given sips of water has rapid inspiration (seemed involuntary) followed by coughing. SLP subsequently offered nectar thick liquids via straw which pt tolerated without coughing despite quite large sips. He is still on [facility] and would not be able to transport to radiology. Also suspect problem is quite inconsistent. For now, will thicken all liquids offered to nectar. Pt to continue with Cortrak until he is more stable. May be able to complete instrumental test in the future if symptoms persist.   HPI HPI: Patient is an 82 y.o. male with PMH: type 2 diabetes mellitus, prostate cancer, and essential hypertension. He presented to the hospital on 02/17/2023 after syncopal episode with associated foaming at the mouth and facial asymmetry. [name] reported to ED staff that patient had not been himself over past few months with significant drowsiness and hypersomnia. He was diagnosed with Covid by his PCP on afternoon of admission and while in the car on way home, left side of face began to droop and he lost consciousness and made a regurgitating sound. 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 He underwent mechanical thrombectomy on 8/9. He was intubated for procedure, extubated aftewards but had to be reintubated on 8/11 due to hypoxia. He was extubated on 8/13. [name] 8/14.      SLP Plan  Continue with current plan of care      Recommendations for follow up therapy are one component of  a multi-disciplinary discharge planning process, led by the attending physician.  Recommendations may be updated based on patient status, additional functional criteria and insurance authorization.    Recommendations  Diet recommendations: Nectar-thick liquid Liquids provided via: Straw Medication Administration: Via alternative means Supervision: Full supervision/cueing for compensatory strategies Compensations: Small sips/bites;Slow rate Postural Changes and/or Swallow Maneuvers: Seated upright 90 degrees                  Oral care QID           Continue with current plan of care     Asianae Minkler, [name]  02/26/2023, 9:41 AM

## 2023-02-26 NOTE — Progress Notes (Signed)
  2 AM lab check showed sodium is trending up 149 to 153, chloride is trending up to 116.  Phos 4.4 WNL.  High mag 3.5. CBC showed worsening leukocytosis 22. Elevated BNP 434. Elevated TSH 4.78. Troponin trended up 161>096>045.  Patient denies any chest pain.  Per chart review patient has history of  DM-2, CKD-3B and HTN presented after acute onset left-sided weakness, facial droop and aphasia.  Code stroke activated in ED.  CT head with right MCA and PCA infarcts.  CT angio head and neck with occlusion of right MCA and PCA segments.  Received TNK and he was intubated for airway protection. underwent revascularization and stenting of occluded right MCA and revascularization of occluded right PCA without stenting. Notably, he was diagnosed with COVID and flu on same day as strokelike symptoms occurred.    Hospital course complicated by acute hypoxic respiratory failure in the setting of aspiration pneumonia, paroxysmal A-fib with RVR, delirium, AKI and urinary retention.  Respiratory failure, A-fib and AKI improved.  Nephrology signed off.  Eventually, he was extubated on 8/13 and transferred to hospitalist service on 8/17 while on 23 L / 80% by HFNC.   #Hypernatremia #Hyperchloremia -Serum sodium has been trending up over the course of last 2 days.  Most recent sodium is 153.  On presentation serum sodium was 141.  Chloride also gradually trended up to 116. - Patient is euvolemic on physical exam. - Checking serum osmolarity, urine Osmo and urine sodium -Patient is to order free water deficit.  However patient has elevated BNP up to 834 and acute hypoxic respiratory failure in the setting of aspiration pneumonia.  That is why continue gentle hydration. -Currently patient is on free water flush 200 cc every 4 hours.  Verified with patient nurse at the bedside.  Increasing free water flush 300 mL every 4 hours for next 24-hous -Continue to check serum sodium every 4 hours. - Consulting nephrology for  management of hyponatremia and hyperchloremia. -Nephrology Dr. Juel Burrow already following the patient.  Please inform nephrology in the a.m. about hypernatremia and hyperchloremia.    # Elevated troponin secondary to demand ischemia Elevated troponin in the setting of demand ischemia from recent A-fib RVR. -Troponin trended up to 146.  EKG showed slight ST elevation on lead II.  Patient is chest pain-free. - Patient is currently on Eliquis, Brilinta and Lipitor. - Consulted cardiology Dr. Brayton Layman earlier for elevated troponin.  Cardiology continue to follow.   Elevated TSH - TSH elevation 4.786.  Checking free T4 and T3.  Tereasa Coop, MD [facility] 02/26/2023, 3:34 AM

## 2023-02-26 NOTE — Plan of Care (Signed)
  Problem: Education: Goal: Knowledge of disease or condition will improve Outcome: Progressing Goal: Knowledge of patient specific risk factors will improve Loraine Leriche N/A or DELETE if not current risk factor) Outcome: Progressing   Problem: Health Behavior/Discharge Planning: Goal: Ability to manage health-related needs will improve Outcome: Progressing   Problem: Self-Care: Goal: Ability to participate in self-care as condition permits will improve Outcome: Progressing   Problem: Nutrition: Goal: Dietary intake will improve Outcome: Progressing   Problem: Cardiovascular: Goal: Vascular access site(s) Level 0-1 will be maintained Outcome: Progressing   Problem: Nutrition: Goal: Adequate nutrition will be maintained Outcome: Progressing   Problem: Pain Managment: Goal: General experience of comfort will improve Outcome: Progressing   Problem: Respiratory: Goal: Ability to maintain a clear airway and adequate ventilation will improve Outcome: Progressing   Problem: Coping: Goal: Psychosocial and spiritual needs will be supported Outcome: Progressing

## 2023-02-26 NOTE — Progress Notes (Signed)
   Palliative Medicine Inpatient Follow Up Note HPI: 82 year old male with hypertension, diabetes, CKD 3B, PAD, HFpEF. (+) right MCA, PNA, AKI. Palliative care asked to get involved for further goals of care conversations in the setting of severe illness.   Today's Discussion 02/26/2023  *Please note that this is a verbal dictation therefore any spelling or grammatical errors are due to the "[employer]agon [employer] One" system interpretation.  Chart reviewed inclusive of vital signs, progress notes, laboratory results, and diagnostic images.   I spoke with patients RN,  Manda this morning. She shares that Ules did have one episode of emesis that she is aware off, in addition he is experiencing loose bowel movements. TFs were reduced.  I met with Jomarie Longs at bedside this morning. He is awake and aware of self and being in the hospital though perseverant on going to the store to get jelly and soda. I was able to re-orient Akshath that this is not something he will be able to do from the hospital setting, he then understood.   There was no family present at bedside this morning.   Questions and concerns addressed. Palliative Support Provided.   Objective Assessment: Vital Signs Vitals:   02/26/23 1000 02/26/23 1100  BP: (!) 125/44 (!) 154/134  Pulse:    Resp: (!) 22 (!) 27  Temp:    SpO2: 99% 90%    Intake/Output Summary (Last 24 hours) at 02/26/2023 1113 Last data filed at 02/26/2023 1100 Gross per 24 hour  Intake 412.07 ml  Output 915 ml  Net -502.93 ml   Last Weight  Most recent update: 02/25/2023  4:03 AM    Weight  79.4 kg (175 lb 0.7 oz)            Gen: Elderly African-American male critically ill HEENT: NGT in place, dry mucous membranes CV: Regular rate and rhythm PULM: On 25LPM HFNC, breathing is nonlabored ABD: soft/nontender EXT: (+) edema Neuro: Awake and alert  SUMMARY OF RECOMMENDATIONS   Full code/full scope of care    Continue to allow time for outcomes  Plan  for [facility] Placement    Ongoing palliative support _____________________________ Symptoms:  Post-Stroke Spacticity: - Consider a low dose anti-spasmatic medication if needed. Complicated as could worsen sedation and increase aspiration risk. Other considerations would be propanolol and/or Botox injection though will defer primary  Vomiting: - Zofran PRN - TF reduced per primary  Delirium: - Implement strict precautions per proticol  Billing based on MDM: Moderate ______________________________________________________________________________________ Lamarr Lulas [place] Palliative Medicine Team Team Cell Phone: [phone] Please utilize secure chat with additional questions, if there is no response within 30 minutes please call the above phone number  Palliative Medicine Team providers are available by phone from 7am to 7pm daily and can be reached through the team cell phone.  Should this patient require assistance outside of these hours, please call the patient's attending physician.

## 2023-02-26 NOTE — Progress Notes (Signed)
PROGRESS NOTE  Xavier [name] DOB: 01/27/1941   PCP: [name], MD  Patient is from: Home.  DOA: 02/17/2023 LOS: 9  Chief complaints Chief Complaint  Patient presents with   Code Stroke     Brief Narrative / Interim history: 82 year old M with PMH of DM-2, CKD-3B and HTN presented after acute onset left-sided weakness, facial droop and aphasia.  Code stroke activated in ED.  CT head with right MCA and PCA infarcts.  CT angio head and neck with occlusion of right MCA and PCA segments.  Received TNK and he was intubated for airway protection. underwent revascularization and stenting of occluded right MCA and revascularization of occluded right PCA without stenting. Notably, he was diagnosed with COVID and flu on same day as strokelike symptoms occurred.   Hospital course complicated by acute hypoxic respiratory failure in the setting of aspiration pneumonia, paroxysmal A-fib with RVR, delirium, AKI and urinary retention.  Respiratory failure, A-fib and AKI improved.  Nephrology signed off.  Eventually, he was extubated on 8/13 and transferred to hospitalist service on 8/17 while on 23 L / 80% by HFNC.   Patient continues to require significant oxygen up to 25 L by HFNC.  In and out of A-fib with RVR    Subjective: Seen and examined earlier this morning.  Concern about ST changes on telemetry.  Had no chest pain or new cardiopulmonary symptoms of finding.  EKG, BNP and troponin checked.  Troponin and BNP slightly elevated.  Cardiology notified and recommended cycling troponin and monitoring.  Of note, patient has AKI with poor clearance.  Coreg decreased due to bradycardia.  Objective: Vitals:   02/26/23 1136 02/26/23 1200 02/26/23 1230 02/26/23 1300  BP: (!) 145/50 115/79  115/66  Pulse:    (!) 112  Resp: (!) 22 (!) 29  (!) 29  Temp:   99 F (37.2 C)   TempSrc:   Axillary   SpO2:  96%  91%  Weight:      Height:        Examination:  GENERAL: No apparent  distress.  Nontoxic. HEENT: MMM.  Vision and hearing grossly intact.  NECK: Supple.  No apparent JVD.  RESP:  No IWOB.  Fair aeration bilaterally. CVS: Irregular rhythm.  HR 110s.  Heart sounds normal.  ABD/GI/GU: BS+. Abd soft, NTND.  Foley and rectal tube in place. MSK/EXT:  Moves extremities. No apparent deformity. No edema.  Mittens bilaterally. SKIN: no apparent skin lesion or wound NEURO: Wake and alert.  Oriented to self, place and person.  Follows commands.  Moves all extremities.  PSYCH: A little restless but no agitation.  Procedures:  8/9-TNK, thrombectomy and intubation for airway protection.  Antibiotic started for aspiration 8/11-extubated but reintubated due to respiratory failure. 8/13-extubated.  Microbiology summarized: COVID-19 PCR nonreactive MRSA PCR nonreactive Full RVP nonreactive Respiratory culture with normal flora Blood cultures NGTD  Assessment and plan: Principal Problem:   Acute ischemic stroke [facility]) Active Problems:   Type II diabetes mellitus with renal manifestations (HCC)   Acute renal failure superimposed on stage 3 chronic kidney disease (HCC)   Chronic kidney disease due to hypertension   Acute respiratory failure with hypoxia (HCC)   Pneumonia due to COVID-19 virus   Aspiration pneumonia of both lungs due to gastric secretions (HCC)   Influenza   Paroxysmal atrial fibrillation (HCC)   Dysphagia  Acute right MCA and PCA infarcts s/p TNK and revascularization with stent in MCA M1 segment and revascularization of  occluded right PCA without stenting.  Moves all extremities equally today. -Continue Brilinta, Eliquis and Lipitor. -Aggressive risk reduction.  Goal SBP <160. -PT/OT   Acute metabolic encephalopathy/delirium: Improving.  He is awake, alert and oriented to self, place and person.  Follows commands.  Moves all extremities.  No agitation but seems to be restless. -Reorientation and delirium precaution -Minimize sedating  medications   Paroxysmal atrial fibrillation with RVR: Was in and out of A-fib with mild RVR.  Coreg reduced due to bradycardia overnight.  Normotensive this morning. -Stop Coreg.  Start p.o. metoprolol 25 mg twice daily -P.o. Cardizem 30 mg every 6 hours as needed -Continue Eliquis -Optimize electrolytes   Acute hypoxic respiratory failure, improving.  Currently on 25 L / 80% FiO2.  No history of COPD or asthma. Aspiration pneumonia versus pneumonitis: COVID, full RVP and respiratory culture unrevealing. -S/p intubation and extubation. -Completed 7 days of Zosyn on 8/17. -Wean oxygen as able.  Currently on 25 L / 80% heated high flow nasal cannula -Continue DuoNeb as needed -Pulmonary toilet  Dysphagia: Upgraded to nectar thick fluids -Follow SLP recommendations -Aspiration precautions  Urinary retention: Was getting around 600-700 UOP with I/O cath in ICU. -Continue doxazosin. -Continue Foley catheter for now   AKI on CKD-3B: Not oliguric.  Multifactorial including contrast, Zosyn and urinary tension: Renal US without acute finding.  Cr slightly up today. Recent Labs    02/21/23 0013 02/21/23 0024 02/21/23 0414 02/21/23 1001 02/21/23 1754 02/22/23 0615 02/23/23 0051 02/24/23 0635 02/25/23 0229 02/26/23 0209  BUN 89* 88* 88* 92* 91* 96* 97* 86* 87* 102*  CREATININE 3.87* 3.88* 3.81* 3.86* 3.58* 3.38* 3.30* 2.46* 2.74* 2.87*  -Continue Foley catheter for now -Avoid nephrotoxic meds -Continue monitoring -Nephrology signed off  Uncontrolled IDDM-2 with hypoglycemia, hyperglycemia and hyperlipidemia: Currently on tube feed.  A1c 9.0%.  Seems like he is on [place] at home.  Tube feed held yesterday due to emesis.  Tube feed restarted at 10 cc/an hour. Recent Labs  Lab 02/25/23 2321 02/25/23 2346 02/26/23 0319 02/26/23 0735 02/26/23 1213  GLUCAP 38* 132* 99 146* 157*  -Continue resistant scale every 4 hours -Hold basal insulin and tube feed coverage for  now. -Continue Lipitor -Further adjustment as appropriate.  Normocytic anemia: H&H stable. Recent Labs    02/19/23 0301 02/19/23 1802 02/20/23 0029 02/20/23 0032 02/21/23 0045 02/22/23 0615 02/23/23 0051 02/24/23 0635 02/25/23 0229 02/26/23 0209  HGB 9.8* 9.2* 8.8* 9.5* 8.2* 9.7* 9.0* 9.6* 9.2* 9.1*  -Continue monitoring  Labile blood pressure/hypertension: Improved. -Discontinue amlodipine and Coreg. -Metoprolol and p.o. Cardizem given A-fib  Nausea with vomiting: Tube feed decreased. -Ordered Reglan every 6 hours -Increase tube feed as tolerated   Hypernatremia: Na 154>> 153. -Continue free water to 300 cc every 4 hours -Recheck sodium in the afternoon   Hematuria: Resolved.   Diarrhea?  Still with rectal tube -Discontinue if no further diarrhea   Physical deconditioning/disposition:  -PT/OT -LTAC?  Leukocytosis: Unclear source of this for this.  Just finished IV Zosyn on 8/17.  No skin break.  Demargination? -May obtain CT chest/abdomen and pelvis if no improvement. -Continue monitoring  Nutrition/inadequate oral intake/dysphagia: Remains n.p.o. Follow SLP eval and recommendation Body mass index is 22.47 kg/m. Nutrition Problem: Inadequate oral intake Etiology: acute illness Signs/Symptoms: NPO status Interventions: Tube feeding   DVT prophylaxis:  apixaban (ELIQUIS) tablet 2.5 mg Start: 02/23/23 1030 Place and maintain sequential compression device Start: 02/17/23 2140 apixaban (ELIQUIS) tablet 2.5 mg  Code  Status: Full code Family Communication: Updated patient's sister at bedside. Level of care: Progressive Status is: Inpatient Remains inpatient appropriate because: Acute respiratory failure with significant oxygen requirement, A-fib with RVR, dysphagia and acute CVA   Final disposition: LTAC? Consultants:  Neurology Nephrology Pulmonology  55 minutes with more than 50% spent in reviewing records, counseling patient/family and coordinating  care.   Sch Meds:  Scheduled Meds:  acetaminophen  650 mg Per Tube Q6H   Or   acetaminophen  650 mg Rectal Q6H   apixaban  2.5 mg Per Tube BID   atorvastatin  80 mg Per Tube Daily   Chlorhexidine Gluconate Cloth  6 each Topical Q0600   docusate  100 mg Per Tube BID   doxazosin  1 mg Per Tube Daily   free water  300 mL Per Tube Q4H   insulin aspart  0-20 Units Subcutaneous Q4H   metoCLOPramide (REGLAN) injection  5 mg Intravenous Q8H   metoprolol tartrate  25 mg Per Tube BID   mouth rinse  15 mL Mouth Rinse 4 times per day   QUEtiapine  25 mg Per Tube QHS   ticagrelor  90 mg Per Tube BID   Continuous Infusions:  sodium chloride Stopped (02/20/23 2337)   feeding supplement (GLUCERNA 1.5 CAL) 20 mL/hr at 02/26/23 1100   PRN Meds:.diltiazem, iohexol, ipratropium-albuterol, metoprolol tartrate, nitroGLYCERIN, ondansetron (ZOFRAN) IV, mouth rinse, polyethylene glycol, senna-docusate  Antimicrobials: Anti-infectives (From admission, onward)    Start     Dose/Rate Route Frequency Ordered Stop   02/24/23 2000  piperacillin-tazobactam (ZOSYN) IVPB 3.375 g        3.375 g 12.5 mL/hr over 240 Minutes Intravenous Every 8 hours 02/24/23 1305 02/25/23 1807   02/20/23 1000  piperacillin-tazobactam (ZOSYN) IVPB 2.25 g  Status:  Discontinued        2.25 g 100 mL/hr over 30 Minutes Intravenous Every 6 hours 02/20/23 0900 02/24/23 1305   02/19/23 2200  oseltamivir (TAMIFLU) capsule 30 mg  Status:  Discontinued        30 mg Per Tube Daily 02/19/23 0928 02/20/23 0908   02/18/23 1000  remdesivir 100 mg in sodium chloride 0.9 % 100 mL IVPB       Placed in "Followed by" Linked Group   100 mg 200 mL/hr over 30 Minutes Intravenous Daily 02/17/23 2123 02/19/23 1004   02/17/23 2330  remdesivir 200 mg in sodium chloride 0.9% 250 mL IVPB       Placed in "Followed by" Linked Group   200 mg 580 mL/hr over 30 Minutes Intravenous Once 02/17/23 2123 02/18/23 0031   02/17/23 2215  oseltamivir (TAMIFLU)  capsule 75 mg  Status:  Discontinued        75 mg Per Tube 2 times daily 02/17/23 2120 02/17/23 2124   02/17/23 2215  oseltamivir (TAMIFLU) capsule 30 mg  Status:  Discontinued        30 mg Per Tube 2 times daily 02/17/23 2124 02/19/23 0928   02/17/23 2200  cefTRIAXone (ROCEPHIN) 2 g in sodium chloride 0.9 % 100 mL IVPB  Status:  Discontinued        2 g 200 mL/hr over 30 Minutes Intravenous Every 24 hours 02/17/23 2050 02/20/23 0900   02/17/23 2200  azithromycin (ZITHROMAX) 500 mg in sodium chloride 0.9 % 250 mL IVPB  Status:  Discontinued        500 mg 250 mL/hr over 60 Minutes Intravenous Every 24 hours 02/17/23 2050 02/21/23 1033  I have personally reviewed the following labs and images: CBC: Recent Labs  Lab 02/22/23 0615 02/23/23 0051 02/24/23 0635 02/25/23 0229 02/26/23 0209  WBC 13.1* 9.7 13.0* 18.3* 22.0*  HGB 9.7* 9.0* 9.6* 9.2* 9.1*  HCT 29.4* 28.0* 30.8* 30.4* 30.0*  MCV 80.3 82.8 83.0 85.4 87.0  PLT 202 188 226 248 280   BMP &GFR Recent Labs  Lab 02/22/23 0615 02/23/23 0051 02/24/23 0635 02/25/23 0229 02/25/23 2157 02/26/23 0209  NA 143 145 149* 154*  --  153*  K 3.8 4.1 3.4* 3.7  --  3.6  CL 105 107 110 116*  --  116*  CO2 22 22 25 24   --  24  GLUCOSE 113* 249* 186* 166*  --  96  BUN 96* 97* 86* 87*  --  102*  CREATININE 3.38* 3.30* 2.46* 2.74*  --  2.87*  CALCIUM 9.3 8.5* 8.9 8.9  --  8.6*  MG 3.3* 3.2* 3.3*  --  3.6* 3.5*  PHOS  --  4.7* 3.6 3.8  --  4.4   Estimated Creatinine Clearance: 22.3 mL/min (A) (by C-G formula based on SCr of 2.87 mg/dL (H)). Liver & Pancreas: Recent Labs  Lab 02/23/23 0051 02/24/23 0635 02/25/23 0229 02/26/23 0209  AST  --   --   --  37  ALT  --   --   --  51*  ALKPHOS  --   --   --  75  BILITOT  --   --   --  0.6  PROT  --   --   --  6.9  ALBUMIN 3.0* 2.9* 2.8* 2.6*   No results for input(s): "LIPASE", "AMYLASE" in the last 168 hours. No results for input(s): "AMMONIA" in the last 168  hours. Diabetic: No results for input(s): "HGBA1C" in the last 72 hours. Recent Labs  Lab 02/25/23 2321 02/25/23 2346 02/26/23 0319 02/26/23 0735 02/26/23 1213  GLUCAP 38* 132* 99 146* 157*   Cardiac Enzymes: No results for input(s): "CKTOTAL", "CKMB", "CKMBINDEX", "TROPONINI" in the last 168 hours. No results for input(s): "PROBNP" in the last 8760 hours. Coagulation Profile: No results for input(s): "INR", "PROTIME" in the last 168 hours. Thyroid Function Tests: Recent Labs    02/26/23 0209  TSH 4.786*  FREET4 0.83   Lipid Profile: Recent Labs    02/24/23 0635  TRIG 148   Anemia Panel: No results for input(s): "VITAMINB12", "FOLATE", "FERRITIN", "TIBC", "IRON", "RETICCTPCT" in the last 72 hours. Urine analysis:    Component Value Date/Time   COLORURINE YELLOW 02/18/2023 0137   APPEARANCEUR CLEAR 02/18/2023 0137   LABSPEC >1.046 (H) 02/18/2023 0137   PHURINE 5.0 02/18/2023 0137   GLUCOSEU >=500 (A) 02/18/2023 0137   HGBUR NEGATIVE 02/18/2023 0137   BILIRUBINUR NEGATIVE 02/18/2023 0137   KETONESUR NEGATIVE 02/18/2023 0137   PROTEINUR 30 (A) 02/18/2023 0137   UROBILINOGEN 1.0 09/28/2014 1645   NITRITE NEGATIVE 02/18/2023 0137   LEUKOCYTESUR NEGATIVE 02/18/2023 0137   Sepsis Labs: Invalid input(s): "PROCALCITONIN", "LACTICIDVEN"  Microbiology: Recent Results (from the past 240 hour(s))  SARS Coronavirus 2 by RT PCR (hospital order, performed in [facility] hospital lab) *cepheid single result test*     Status: None   Collection Time: 02/17/23  3:32 PM  Result Value Ref Range Status   SARS Coronavirus 2 by RT PCR NEGATIVE NEGATIVE Final    Comment: Performed at [facility], [address], [place], [place] [address]  Culture, Respiratory w Gram Stain  Status: None   Collection Time: 02/17/23  8:36 PM   Specimen: Tracheal Aspirate; Respiratory  Result Value Ref Range Status   Specimen Description TRACHEAL ASPIRATE  Final   Special Requests NONE   Final   Gram Stain   Final    FEW WBC PRESENT, PREDOMINANTLY PMN RARE GRAM POSITIVE COCCI    Culture   Final    Normal respiratory flora-no Staph aureus or Pseudomonas seen Performed at [facility], [address], [place], [place] [address]    Report Status 02/21/2023 FINAL  Final  Respiratory (~20 pathogens) panel by PCR     Status: None   Collection Time: 02/17/23  8:56 PM   Specimen: Tracheal Aspirate; Respiratory  Result Value Ref Range Status   Adenovirus NOT DETECTED NOT DETECTED Final   Coronavirus 229E NOT DETECTED NOT DETECTED Final    Comment: (NOTE) The Coronavirus on the Respiratory Panel, DOES NOT test for the novel  Coronavirus (2019 nCoV)    Coronavirus HKU1 NOT DETECTED NOT DETECTED Final   Coronavirus NL63 NOT DETECTED NOT DETECTED Final   Coronavirus OC43 NOT DETECTED NOT DETECTED Final   Metapneumovirus NOT DETECTED NOT DETECTED Final   Rhinovirus / Enterovirus NOT DETECTED NOT DETECTED Final   Influenza A NOT DETECTED NOT DETECTED Final   Influenza B NOT DETECTED NOT DETECTED Final   Parainfluenza Virus 1 NOT DETECTED NOT DETECTED Final   Parainfluenza Virus 2 NOT DETECTED NOT DETECTED Final   Parainfluenza Virus 3 NOT DETECTED NOT DETECTED Final   Parainfluenza Virus 4 NOT DETECTED NOT DETECTED Final   Respiratory Syncytial Virus NOT DETECTED NOT DETECTED Final   Bordetella pertussis NOT DETECTED NOT DETECTED Final   Bordetella Parapertussis NOT DETECTED NOT DETECTED Final   Chlamydophila pneumoniae NOT DETECTED NOT DETECTED Final   Mycoplasma pneumoniae NOT DETECTED NOT DETECTED Final    Comment: Performed at [facility], [address] [place], [place] [address]  MRSA Next Gen by PCR, Nasal     Status: None   Collection Time: 02/17/23  9:00 PM  Result Value Ref Range Status   MRSA by PCR Next Gen NOT DETECTED NOT DETECTED Final    Comment: (NOTE) The GeneXpert MRSA Assay (FDA approved for NASAL specimens only), is one component of a  comprehensive MRSA colonization surveillance program. It is not intended to diagnose MRSA infection nor to guide or monitor treatment for MRSA infections. Test performance is not FDA approved in patients less than 67 years old. Performed at [facility], [address] [place], [place] [address]   Culture, blood (Routine X 2) w Reflex to ID Panel     Status: None   Collection Time: 02/17/23 11:36 PM   Specimen: BLOOD RIGHT HAND  Result Value Ref Range Status   Specimen Description BLOOD RIGHT HAND  Final   Special Requests   Final    BOTTLES DRAWN AEROBIC AND ANAEROBIC Blood Culture adequate volume   Culture   Final    NO GROWTH 5 DAYS Performed at [facility], [address], [place], [place] [address]    Report Status 02/22/2023 FINAL  Final  Culture, blood (Routine X 2) w Reflex to ID Panel     Status: None   Collection Time: 02/17/23 11:36 PM   Specimen: BLOOD RIGHT HAND  Result Value Ref Range Status   Specimen Description BLOOD RIGHT HAND  Final   Special Requests   Final    BOTTLES DRAWN  AEROBIC AND ANAEROBIC Blood Culture adequate volume   Culture   Final    NO GROWTH 5 DAYS Performed at [facility], [address] [place], [place] [address]    Report Status 02/22/2023 FINAL  Final  Culture, Respiratory w Gram Stain     Status: None   Collection Time: 02/20/23  9:21 AM   Specimen: Tracheal Aspirate; Respiratory  Result Value Ref Range Status   Specimen Description TRACHEAL ASPIRATE  Final   Special Requests NONE  Final   Gram Stain NO WBC SEEN NO ORGANISMS SEEN   Final   Culture   Final    RARE Normal respiratory flora-no Staph aureus or Pseudomonas seen Performed at [facility], [address] [place], [place] [address]    Report Status 02/22/2023 FINAL  Final  SARS Coronavirus 2 by RT PCR (hospital order, performed in [facility] hospital lab) *cepheid single result test* Anterior Nasal Swab     Status: None   Collection Time:  02/20/23 10:35 AM   Specimen: Anterior Nasal Swab  Result Value Ref Range Status   SARS Coronavirus 2 by RT PCR NEGATIVE NEGATIVE Final    Comment: Performed at [facility], [address] [place], [place] [address]  Respiratory (~20 pathogens) panel by PCR     Status: None   Collection Time: 02/20/23 10:35 AM   Specimen: Nasopharyngeal Swab; Respiratory  Result Value Ref Range Status   Adenovirus NOT DETECTED NOT DETECTED Final   Coronavirus 229E NOT DETECTED NOT DETECTED Final    Comment: (NOTE) The Coronavirus on the Respiratory Panel, DOES NOT test for the novel  Coronavirus (2019 nCoV)    Coronavirus HKU1 NOT DETECTED NOT DETECTED Final   Coronavirus NL63 NOT DETECTED NOT DETECTED Final   Coronavirus OC43 NOT DETECTED NOT DETECTED Final   Metapneumovirus NOT DETECTED NOT DETECTED Final   Rhinovirus / Enterovirus NOT DETECTED NOT DETECTED Final   Influenza A NOT DETECTED NOT DETECTED Final   Influenza B NOT DETECTED NOT DETECTED Final   Parainfluenza Virus 1 NOT DETECTED NOT DETECTED Final   Parainfluenza Virus 2 NOT DETECTED NOT DETECTED Final   Parainfluenza Virus 3 NOT DETECTED NOT DETECTED Final   Parainfluenza Virus 4 NOT DETECTED NOT DETECTED Final   Respiratory Syncytial Virus NOT DETECTED NOT DETECTED Final   Bordetella pertussis NOT DETECTED NOT DETECTED Final   Bordetella Parapertussis NOT DETECTED NOT DETECTED Final   Chlamydophila pneumoniae NOT DETECTED NOT DETECTED Final   Mycoplasma pneumoniae NOT DETECTED NOT DETECTED Final    Comment: Performed at [facility], [address], [place], [place] [address]    Radiology Studies: No results found.    Kylin Genna T. Taji Sather [facility]  If 7PM-7AM, please contact night-coverage [email or web address] 02/26/2023, 1:32 PM

## 2023-02-27 ENCOUNTER — Other Ambulatory Visit: Payer: Self-pay

## 2023-02-27 DIAGNOSIS — I4891 Unspecified atrial fibrillation: Secondary | ICD-10-CM | POA: Diagnosis not present

## 2023-02-27 DIAGNOSIS — I639 Cerebral infarction, unspecified: Secondary | ICD-10-CM | POA: Diagnosis not present

## 2023-02-27 DIAGNOSIS — J690 Pneumonitis due to inhalation of food and vomit: Secondary | ICD-10-CM | POA: Diagnosis not present

## 2023-02-27 DIAGNOSIS — J9601 Acute respiratory failure with hypoxia: Secondary | ICD-10-CM | POA: Diagnosis not present

## 2023-02-27 LAB — CBC WITH DIFFERENTIAL/PLATELET
Abs Immature Granulocytes: 0.15 10*3/uL — ABNORMAL HIGH (ref 0.00–0.07)
Basophils Absolute: 0 10*3/uL (ref 0.0–0.1)
Basophils Relative: 0 %
Eosinophils Absolute: 0.1 10*3/uL (ref 0.0–0.5)
Eosinophils Relative: 1 %
HCT: 27.8 % — ABNORMAL LOW (ref 39.0–52.0)
Hemoglobin: 8.2 g/dL — ABNORMAL LOW (ref 13.0–17.0)
Immature Granulocytes: 1 %
Lymphocytes Relative: 6 %
Lymphs Abs: 1.1 10*3/uL (ref 0.7–4.0)
MCH: 25.5 pg — ABNORMAL LOW (ref 26.0–34.0)
MCHC: 29.5 g/dL — ABNORMAL LOW (ref 30.0–36.0)
MCV: 86.6 fL (ref 80.0–100.0)
Monocytes Absolute: 0.5 10*3/uL (ref 0.1–1.0)
Monocytes Relative: 3 %
Neutro Abs: 17.1 10*3/uL — ABNORMAL HIGH (ref 1.7–7.7)
Neutrophils Relative %: 89 %
Platelets: 286 10*3/uL (ref 150–400)
RBC: 3.21 MIL/uL — ABNORMAL LOW (ref 4.22–5.81)
RDW: 14.6 % (ref 11.5–15.5)
WBC: 19 10*3/uL — ABNORMAL HIGH (ref 4.0–10.5)
nRBC: 0 % (ref 0.0–0.2)

## 2023-02-27 LAB — GLUCOSE, CAPILLARY
Glucose-Capillary: 145 mg/dL — ABNORMAL HIGH (ref 70–99)
Glucose-Capillary: 156 mg/dL — ABNORMAL HIGH (ref 70–99)
Glucose-Capillary: 174 mg/dL — ABNORMAL HIGH (ref 70–99)
Glucose-Capillary: 185 mg/dL — ABNORMAL HIGH (ref 70–99)
Glucose-Capillary: 268 mg/dL — ABNORMAL HIGH (ref 70–99)
Glucose-Capillary: 282 mg/dL — ABNORMAL HIGH (ref 70–99)

## 2023-02-27 LAB — RENAL FUNCTION PANEL
Albumin: 2.3 g/dL — ABNORMAL LOW (ref 3.5–5.0)
Anion gap: 12 (ref 5–15)
BUN: 106 mg/dL — ABNORMAL HIGH (ref 8–23)
CO2: 22 mmol/L (ref 22–32)
Calcium: 8.2 mg/dL — ABNORMAL LOW (ref 8.9–10.3)
Chloride: 114 mmol/L — ABNORMAL HIGH (ref 98–111)
Creatinine, Ser: 2.54 mg/dL — ABNORMAL HIGH (ref 0.61–1.24)
GFR, Estimated: 25 mL/min — ABNORMAL LOW (ref >60)
Glucose, Bld: 185 mg/dL — ABNORMAL HIGH (ref 70–99)
Phosphorus: 4.7 mg/dL — ABNORMAL HIGH (ref 2.5–4.6)
Potassium: 3.7 mmol/L (ref 3.5–5.1)
Sodium: 148 mmol/L — ABNORMAL HIGH (ref 135–145)

## 2023-02-27 LAB — MAGNESIUM: Magnesium: 3.4 mg/dL — ABNORMAL HIGH (ref 1.7–2.4)

## 2023-02-27 LAB — T3, FREE: T3, Free: 1.6 pg/mL — ABNORMAL LOW (ref 2.0–4.4)

## 2023-02-27 MED ORDER — AMLODIPINE BESYLATE 5 MG PO TABS
5.0000 mg | ORAL_TABLET | Freq: Every day | ORAL | Status: DC
  Administered 2023-02-27 – 2023-03-01 (×3): 5 mg
  Filled 2023-02-27 (×3): qty 1

## 2023-02-27 MED ORDER — FREE WATER
300.0000 mL | Status: DC
  Administered 2023-02-27 – 2023-03-02 (×20): 300 mL

## 2023-02-27 MED ORDER — DOXAZOSIN MESYLATE 2 MG PO TABS
2.0000 mg | ORAL_TABLET | Freq: Every day | ORAL | Status: DC
  Administered 2023-02-28 – 2023-03-01 (×2): 2 mg
  Filled 2023-02-27 (×2): qty 1

## 2023-02-27 MED ORDER — INSULIN ASPART 100 UNIT/ML IJ SOLN
4.0000 [IU] | INTRAMUSCULAR | Status: DC
  Administered 2023-02-28 (×2): 4 [IU] via SUBCUTANEOUS

## 2023-02-27 MED ORDER — INSULIN GLARGINE-YFGN 100 UNIT/ML ~~LOC~~ SOLN
20.0000 [IU] | Freq: Every day | SUBCUTANEOUS | Status: DC
  Administered 2023-02-27 – 2023-02-28 (×2): 20 [IU] via SUBCUTANEOUS
  Filled 2023-02-27 (×2): qty 0.2

## 2023-02-27 NOTE — Progress Notes (Signed)
Nutrition Follow-up  DOCUMENTATION CODES:  Not applicable  INTERVENTION:  Continue diet as ordered by SLP when alert Feeding assistance TF via cortrak Glucerna 1.5 at 55 ml/h (1320 ml per day) Continue to advance rate back up to goal Free water per MD Provides 1980 kcal, 109 gm protein, 1002 ml free water daily  NUTRITION DIAGNOSIS:  Inadequate oral intake related to acute illness as evidenced by NPO status. - remains applicable  GOAL:  Patient will meet greater than or equal to 90% of their needs - progressing, TF and diet in place  MONITOR:  Labs, Weight trends, Vent status, TF tolerance  REASON FOR ASSESSMENT:  Consult, Ventilator Enteral/tube feeding initiation and management  ASSESSMENT:   Pt admitted with acute L-sided weakness d/t acute ischemic stroke following outpatient MD appointment where he was diagnosed with COVID and flu however pt tested negative on admission. PMH significant for T2DM, prostate cancer, essential HTN, CKD IIIb.  8/9 - s/p TNK, thrombectomy, intubated for airway protection 8/10 - tube feeding initiated (OGT side port gastric) 8/11 - extubated, re-intubated 8/13 - extubated, NGT remains in place 8/14 - cortrak placed 8/16 - vomiting, TF held 8/18 - Trickle TF restarted, SLP advanced to full/nectar thick liquids 8/19 - SLP evaluation, DYS 2 with thin liquids  Pt resting in bed at the time of assessment. Wife at bedside. TF infusing at 4mL/h. Wife reports that pt did have some vomiting yesterday morning but seems to be doing ok since then. Does note that he has been hiccupping after drinking plain water but does not when the thick liquids.   SLP advance pt's diet and upgraded to thin liquids this AM. Only one meal recorded and minimal intake. Would recommend continuing TF until pt with more consistent PO intake and mentation.  Noted that pt with AKI and elevated sodium. Over the weekend MD increased free water flushes from 200 to q4h  but added stop date of 8/18. No free water administered since ~1500 8/18. Reached out to current attending about restarting free water.  Average Meal Intake: 8/19: 5% average intake x 1 recorded meals   Intake/Output Summary (Last 24 hours) at 02/27/2023 1004 Last data filed at 02/27/2023 0900 Gross per 24 hour  Intake 847.28 ml  Output 1215 ml  Net -367.72 ml  Net IO Since Admission: 2,880.26 mL [02/27/23 1004]  Nutritionally Relevant Medications: Scheduled Meds:  atorvastatin  80 mg Daily   docusate  100 mg BID   [START ON 02/28/2023] doxazosin  2 mg Daily   insulin aspart  0-20 Units Q4H   insulin glargine-yfgn  20 Units Daily   metoCLOPramide (REGLAN) injection  5 mg Q8H   Continuous Infusions:  feeding supplement (GLUCERNA 1.5 CAL) 20 mL/hr at 02/27/23 0400   Labs Reviewed: Na 148, chloride 114 BUN 106, creatinine 2.54 Phosphorus 4.7 Mg 3.4 CBG ranges from 156-226 mg/dL over the last 24 hours HgbA1c 9%  NUTRITION - FOCUSED PHYSICAL EXAM: Flowsheet Row Most Recent Value  Orbital Region No depletion  Upper Arm Region No depletion  Thoracic and Lumbar Region No depletion  Buccal Region No depletion  Temple Region No depletion  Clavicle Bone Region No depletion  Clavicle and Acromion Bone Region No depletion  Scapular Bone Region No depletion  Dorsal Hand Unable to assess  [mittens]  Patellar Region Mild depletion  Anterior Thigh Region Moderate depletion  Posterior Calf Region Mild depletion  Edema (RD Assessment) None  Hair Reviewed  Eyes Reviewed  Mouth Reviewed  Skin Reviewed  Nails Reviewed    Diet Order:   Diet Order             Diet full liquid Fluid consistency: Nectar Thick  Diet effective now                   EDUCATION NEEDS:   Not appropriate for education at this time  Skin:  Skin Assessment: Reviewed RN Assessment  Last BM:  8/19 - type 7  Height:   Ht Readings from Last 1 Encounters:  02/22/23 6\' 2"  (1.88 m)    Weight:    Wt Readings from Last 1 Encounters:  02/27/23 80.6 kg    Ideal Body Weight:  86.4 kg  BMI:  Body mass index is 22.81 kg/m.  Estimated Nutritional Needs:  Kcal:  2000-2200 Protein:  100-115g Fluid:  >/=2L    Greig Castilla, RD, LDN Clinical Dietitian RD pager # available in AMION  After hours/weekend pager # available in [facility]

## 2023-02-27 NOTE — Progress Notes (Signed)
Speech Language Pathology Treatment: Dysphagia  Patient Details Name: Xavier [name] MRN: [id number] DOB: April 07, 1941 Today's Date: 02/27/2023 Time: 2130-8657 SLP Time Calculation (min) (ACUTE ONLY): 19 min  Assessment / Plan / Recommendation Clinical Impression  Pt demonstrates significant improvement in breathing pattern, attention, awareness. Pt able to verbalize complex wants and needs. Consumed 3 oz of water consecutively with no signs of aspiration x2. Feels thickened liquids and sweet stuff is making him feel ill. Pt demonstrated ability to masticate soft solids. Will advance to dys 2/thin.   HPI HPI: Patient is an 82 y.o. male with PMH: type 2 diabetes mellitus, prostate cancer, and essential hypertension. He presented to the hospital on 02/17/2023 after syncopal episode with associated foaming at the mouth and facial asymmetry. [name] reported to ED staff that patient had not been himself over past few months with significant drowsiness and hypersomnia. He was diagnosed with Covid by his PCP on afternoon of admission and while in the car on way home, left side of face began to droop and he lost consciousness and made a regurgitating sound. 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 He underwent mechanical thrombectomy on 8/9. He was intubated for procedure, extubated aftewards but had to be reintubated on 8/11 due to hypoxia. He was extubated on 8/13. [name] 8/14.      SLP Plan         Recommendations for follow up therapy are one component of a multi-disciplinary discharge planning process, led by the attending physician.  Recommendations may be updated based on patient status, additional functional criteria and insurance authorization.    Recommendations  Diet recommendations: Dysphagia 2 (fine chop);Thin liquid Liquids provided via: Straw Medication Administration: Whole meds with puree Supervision: Full supervision/cueing for  compensatory strategies Compensations: Small sips/bites Postural Changes and/or Swallow Maneuvers: Seated upright 90 degrees                  Oral care BID   Frequent or constant Supervision/Assistance             Xavier [name], Xavier [name]  02/27/2023, 10:40 AM

## 2023-02-27 NOTE — Progress Notes (Signed)
   Palliative Medicine Inpatient Follow Up Note  Per conversation with patients spouse, [name] plan to complete Advance directives and MOST form tomorrow.   No Charge ______________________________________________________________________________________ Lamarr Lulas [place] Palliative Medicine Team Team Cell Phone: [phone] Please utilize secure chat with additional questions, if there is no response within 30 minutes please call the above phone number  Palliative Medicine Team providers are available by phone from 7am to 7pm daily and can be reached through the team cell phone.  Should this patient require assistance outside of these hours, please call the patient's attending physician.

## 2023-02-27 NOTE — Progress Notes (Signed)
PROGRESS NOTE  Xavier [name] UJW:[id number] DOB: February 03, 1941   PCP: [name], MD  Patient is from: Home.  DOA: 02/17/2023 LOS: 10  Chief complaints Chief Complaint  Patient presents with   Code Stroke     Brief Narrative / Interim history: 82 year old M with PMH of DM-2, CKD-3B and HTN presented after acute onset left-sided weakness, facial droop and aphasia.  Code stroke activated in ED.  CT head with right MCA and PCA infarcts.  CT angio head and neck with occlusion of right MCA and PCA segments.  Received TNK and he was intubated for airway protection. underwent revascularization and stenting of occluded right MCA and revascularization of occluded right PCA without stenting. Notably, he was diagnosed with COVID and flu on same day as strokelike symptoms occurred.   Hospital course complicated by acute hypoxic respiratory failure in the setting of aspiration pneumonia, paroxysmal A-fib with RVR, delirium, AKI and urinary retention.  Respiratory failure, A-fib and AKI improved.  Nephrology signed off.  Eventually, he was extubated on 8/13 and transferred to hospitalist service on 8/17 while on 23 L / 80% by HFNC.   Patient continues to require significant oxygen up to 25 L by HFNC.  In and out of A-fib with RVR and bradycardia.  Cardiology consulted.  Started on amiodarone drip.  Dysphagia improved and has been upgraded to dysphagia 2 diet.  Insurance denied LTAC.  Attempted to call patient's insurance at [phone] for peer to peer review but no answer. Left voicemail with patient's information, my name and my callback number.    Subjective: Seen and examined earlier this morning.  No major events overnight of this morning.  No complaints.  Asking for ginger ale or beer.  He says he feels thirsty.  Denies pain, shortness of breath or cough.  Seems he is on 15 L by HFNC saturating at 94%.  Objective: Vitals:   02/27/23 0340 02/27/23 0400 02/27/23 0500 02/27/23 0808  BP:  (!)  146/49 (!) 159/54   Pulse:      Resp:  19 18   Temp: 99.2 F (37.3 C)   98.7 F (37.1 C)  TempSrc: Oral   Axillary  SpO2:  95% 97%   Weight:   80.6 kg   Height:        Examination:  GENERAL: No apparent distress.  Nontoxic. HEENT: MMM.  Vision and hearing grossly intact.  NECK: Supple.  No apparent JVD.  RESP:  No IWOB.  Fair aeration bilaterally. CVS: Sinus bradycardia to 58.  Heart sounds normal.  ABD/GI/GU: BS+. Abd soft, NTND.  Foley and rectal tube in place. MSK/EXT:  Moves extremities. No apparent deformity. No edema.  Mittens bilaterally. SKIN: no apparent skin lesion or wound NEURO: Wake and alert.  Oriented to self, person and "Xavier [name]" but thinks he is in [place].  Follows commands.  Moves all extremities.  PSYCH: A little restless but no agitation.  Procedures:  8/9-TNK, thrombectomy and intubation for airway protection.  Antibiotic started for aspiration 8/11-extubated but reintubated due to respiratory failure. 8/13-extubated.  Microbiology summarized: COVID-19 PCR nonreactive MRSA PCR nonreactive Full RVP nonreactive Respiratory culture with normal flora Blood cultures NGTD  Assessment and plan: Principal Problem:   Acute ischemic stroke [facility]) Active Problems:   Type II diabetes mellitus with renal manifestations (HCC)   Acute renal failure superimposed on stage 3 chronic kidney disease (HCC)   Chronic kidney disease due to hypertension   Acute respiratory failure with hypoxia (HCC)  Pneumonia due to COVID-19 virus   Aspiration pneumonia of both lungs due to gastric secretions (HCC)   Influenza   Paroxysmal atrial fibrillation (HCC)   Dysphagia   Atrial fibrillation with rapid ventricular response (HCC)  Acute right MCA and PCA infarcts s/p TNK and revascularization with stent in MCA M1 segment and revascularization of occluded right PCA without stenting.  Moves all extremities equally today. -Continue Brilinta, Eliquis and  Lipitor. -Aggressive risk reduction.  Goal SBP <160. -PT/OT   Acute metabolic encephalopathy/delirium: Improving.  No agitation but restless.. -Reorientation and delirium precaution -Minimize sedating medications   Paroxysmal atrial fibrillation with RVR: Intermittent RVR with bradycardia.  Cardiology consulted.  Started on amiodarone drip.  Now sinus rhythm with intermittent bradycardia. -Continue IV amiodarone per cardiology -Continue Eliquis -Optimize electrolytes   Acute hypoxic respiratory failure, improving.  No history of COPD or asthma. Seems she is on 15 L by HFNC Aspiration pneumonia versus pneumonitis: COVID, full RVP and respiratory culture unrevealing. -S/p intubation and extubation. -Completed 7 days of Zosyn on 8/17. -Wean oxygen as able.  Minimum oxygen to keep saturation above 88%. -Continue DuoNeb as needed -Pulmonary toilet  Dysphagia: Upgraded to dysphagia 2 diet. -Follow further SLP recommendations -Aspiration precautions  Urinary retention: Was getting around 600-700 UOP with I/O cath in ICU. -Increase doxazosin to 2 mg daily. -Voiding trial today. -Bladder scan every 6 hours after discontinuing Foley   AKI on CKD-3B: Not oliguric.  Multifactorial including contrast, Zosyn and urinary tension: Renal US without acute finding.  Started to improve. Recent Labs    02/21/23 0414 02/21/23 1001 02/21/23 1754 02/22/23 0615 02/23/23 0051 02/24/23 1610 02/25/23 0229 02/26/23 0209 02/26/23 1455 02/27/23 0207  BUN 88* 92* 91* 96* 97* 86* 87* 102* 105* 106*  CREATININE 3.81* 3.86* 3.58* 3.38* 3.30* 2.46* 2.74* 2.87* 2.63* 2.54*  -Discontinue Foley -Avoid nephrotoxic meds -Continue monitoring -Nephrology signed off  Uncontrolled IDDM-2 with hypoglycemia, hyperglycemia and hyperlipidemia: Currently on tube feed.  A1c 9.0%.  Seems like he is on [place] at home.  Recent Labs  Lab 02/26/23 2002 02/26/23 2355 02/27/23 0336 02/27/23 0803  02/27/23 1144  GLUCAP 226* 197* 156* 185* 268*  -Continue resistant scale every 4 hours -Resume Semglee at 20 units daily -Continue Lipitor -Further adjustment as appropriate.  Normocytic anemia: Slight drop in Hgb today.  No obvious bleeding. Recent Labs    02/19/23 1802 02/20/23 0029 02/20/23 0032 02/21/23 0045 02/22/23 0615 02/23/23 0051 02/24/23 9604 02/25/23 0229 02/26/23 0209 02/27/23 0207  HGB 9.2* 8.8* 9.5* 8.2* 9.7* 9.0* 9.6* 9.2* 9.1* 8.2*  -Continue monitoring  Labile blood pressure/hypertension: BP slightly elevated today -Resume amlodipine -Increase Cardura -Continue monitoring.  Nausea with vomiting: Seems to have resolved after decreasing tube feed and starting Reglan. -Continue Reglan every 6 hours -Increase tube feed as tolerated   Hypernatremia: Na 154>> 153> 148 -Continue free water to 300 cc every 4 hours -Recheck in the morning.   Hematuria: Resolved.   Diarrhea?  Still with rectal tube -Discontinue if no further diarrhea   Physical deconditioning/disposition:  -PT/OT  Leukocytosis: Unclear source of this for this.  Just finished IV Zosyn on 8/17.  Improving. -Continue monitoring  Nutrition/inadequate oral intake/dysphagia: Upgraded to dysphagia 2 diet. Body mass index is 22.81 kg/m. Nutrition Problem: Inadequate oral intake Etiology: acute illness Signs/Symptoms: NPO status Interventions: Tube feeding  Pressure skin injury: Not POA.  Pressure Injury 02/26/23 Buttocks Right;Left Stage 2 -  Partial thickness loss of dermis presenting as a shallow  open injury with a red, pink wound bed without slough. (Active)  02/26/23 1600  Location: Buttocks  Location Orientation: Right;Left  Staging: Stage 2 -  Partial thickness loss of dermis presenting as a shallow open injury with a red, pink wound bed without slough.  Wound Description (Comments):   Present on Admission:   Dressing Type Moisture barrier 02/26/23 2000   DVT prophylaxis:   apixaban (ELIQUIS) tablet 2.5 mg Start: 02/23/23 1030 Place and maintain sequential compression device Start: 02/17/23 2140 apixaban (ELIQUIS) tablet 2.5 mg  Code Status: Full code Family Communication: Updated patient's sister at bedside on 8/18.  None at bedside today. Level of care: Progressive Status is: Inpatient Remains inpatient appropriate because: Acute respiratory failure with significant oxygen requirement, A-fib with RVR, dysphagia and acute CVA   Final disposition: LTAC? Consultants:  Neurology Nephrology Pulmonology Cardiology  55 minutes with more than 50% spent in reviewing records, counseling patient/family and coordinating care.   Sch Meds:  Scheduled Meds:  acetaminophen  650 mg Per Tube Q6H   Or   acetaminophen  650 mg Rectal Q6H   apixaban  2.5 mg Per Tube BID   atorvastatin  80 mg Per Tube Daily   Chlorhexidine Gluconate Cloth  6 each Topical Q0600   docusate  100 mg Per Tube BID   doxazosin  1 mg Per Tube Daily   insulin aspart  0-20 Units Subcutaneous Q4H   metoCLOPramide (REGLAN) injection  5 mg Intravenous Q8H   mouth rinse  15 mL Mouth Rinse 4 times per day   QUEtiapine  25 mg Per Tube QHS   ticagrelor  90 mg Per Tube BID   Continuous Infusions:  sodium chloride Stopped (02/20/23 2337)   amiodarone 30 mg/hr (02/27/23 0525)   feeding supplement (GLUCERNA 1.5 CAL) 20 mL/hr at 02/27/23 0400   PRN Meds:.iohexol, ipratropium-albuterol, metoprolol tartrate, nitroGLYCERIN, ondansetron (ZOFRAN) IV, mouth rinse, polyethylene glycol, senna-docusate  Antimicrobials: Anti-infectives (From admission, onward)    Start     Dose/Rate Route Frequency Ordered Stop   02/24/23 2000  piperacillin-tazobactam (ZOSYN) IVPB 3.375 g        3.375 g 12.5 mL/hr over 240 Minutes Intravenous Every 8 hours 02/24/23 1305 02/25/23 1807   02/20/23 1000  piperacillin-tazobactam (ZOSYN) IVPB 2.25 g  Status:  Discontinued        2.25 g 100 mL/hr over 30 Minutes Intravenous  Every 6 hours 02/20/23 0900 02/24/23 1305   02/19/23 2200  oseltamivir (TAMIFLU) capsule 30 mg  Status:  Discontinued        30 mg Per Tube Daily 02/19/23 0928 02/20/23 0908   02/18/23 1000  remdesivir 100 mg in sodium chloride 0.9 % 100 mL IVPB       Placed in "Followed by" Linked Group   100 mg 200 mL/hr over 30 Minutes Intravenous Daily 02/17/23 2123 02/19/23 1004   02/17/23 2330  remdesivir 200 mg in sodium chloride 0.9% 250 mL IVPB       Placed in "Followed by" Linked Group   200 mg 580 mL/hr over 30 Minutes Intravenous Once 02/17/23 2123 02/18/23 0031   02/17/23 2215  oseltamivir (TAMIFLU) capsule 75 mg  Status:  Discontinued        75 mg Per Tube 2 times daily 02/17/23 2120 02/17/23 2124   02/17/23 2215  oseltamivir (TAMIFLU) capsule 30 mg  Status:  Discontinued        30 mg Per Tube 2 times daily 02/17/23 2124 02/19/23 0928   02/17/23 2200  cefTRIAXone (ROCEPHIN) 2 g in sodium chloride 0.9 % 100 mL IVPB  Status:  Discontinued        2 g 200 mL/hr over 30 Minutes Intravenous Every 24 hours 02/17/23 2050 02/20/23 0900   02/17/23 2200  azithromycin (ZITHROMAX) 500 mg in sodium chloride 0.9 % 250 mL IVPB  Status:  Discontinued        500 mg 250 mL/hr over 60 Minutes Intravenous Every 24 hours 02/17/23 2050 02/21/23 1033        I have personally reviewed the following labs and images: CBC: Recent Labs  Lab 02/23/23 0051 02/24/23 0635 02/25/23 0229 02/26/23 0209 02/27/23 0207  WBC 9.7 13.0* 18.3* 22.0* 19.0*  NEUTROABS  --   --   --   --  17.1*  HGB 9.0* 9.6* 9.2* 9.1* 8.2*  HCT 28.0* 30.8* 30.4* 30.0* 27.8*  MCV 82.8 83.0 85.4 87.0 86.6  PLT 188 226 248 280 286   BMP &GFR Recent Labs  Lab 02/23/23 0051 02/24/23 0635 02/25/23 0229 02/25/23 2157 02/26/23 0209 02/26/23 1455 02/27/23 0207  NA 145 149* 154*  --  153* 148* 148*  K 4.1 3.4* 3.7  --  3.6 3.4* 3.7  CL 107 110 116*  --  116* 115* 114*  CO2 22 25 24   --  24 22 22   GLUCOSE 249* 186* 166*  --  96 255*  185*  BUN 97* 86* 87*  --  102* 105* 106*  CREATININE 3.30* 2.46* 2.74*  --  2.87* 2.63* 2.54*  CALCIUM 8.5* 8.9 8.9  --  8.6* 8.2* 8.2*  MG 3.2* 3.3*  --  3.6* 3.5*  --  3.4*  PHOS 4.7* 3.6 3.8  --  4.4 5.0* 4.7*   Estimated Creatinine Clearance: 25.6 mL/min (A) (by C-G formula based on SCr of 2.54 mg/dL (H)). Liver & Pancreas: Recent Labs  Lab 02/24/23 0635 02/25/23 0229 02/26/23 0209 02/26/23 1455 02/27/23 0207  AST  --   --  37  --   --   ALT  --   --  51*  --   --   ALKPHOS  --   --  75  --   --   BILITOT  --   --  0.6  --   --   PROT  --   --  6.9  --   --   ALBUMIN 2.9* 2.8* 2.6* 2.2* 2.3*   No results for input(s): "LIPASE", "AMYLASE" in the last 168 hours. No results for input(s): "AMMONIA" in the last 168 hours. Diabetic: No results for input(s): "HGBA1C" in the last 72 hours. Recent Labs  Lab 02/26/23 2002 02/26/23 2355 02/27/23 0336 02/27/23 0803 02/27/23 1144  GLUCAP 226* 197* 156* 185* 268*   Cardiac Enzymes: No results for input(s): "CKTOTAL", "CKMB", "CKMBINDEX", "TROPONINI" in the last 168 hours. No results for input(s): "PROBNP" in the last 8760 hours. Coagulation Profile: No results for input(s): "INR", "PROTIME" in the last 168 hours. Thyroid Function Tests: Recent Labs    02/26/23 0209  TSH 4.786*  FREET4 0.83  T3FREE 1.6*   Lipid Profile: No results for input(s): "CHOL", "HDL", "LDLCALC", "TRIG", "CHOLHDL", "LDLDIRECT" in the last 72 hours.  Anemia Panel: No results for input(s): "VITAMINB12", "FOLATE", "FERRITIN", "TIBC", "IRON", "RETICCTPCT" in the last 72 hours. Urine analysis:    Component Value Date/Time   COLORURINE YELLOW 02/18/2023 0137   APPEARANCEUR CLEAR 02/18/2023 0137   LABSPEC >1.046 (H) 02/18/2023 0137   PHURINE 5.0 02/18/2023 5784  GLUCOSEU >=500 (A) 02/18/2023 0137   HGBUR NEGATIVE 02/18/2023 0137   BILIRUBINUR NEGATIVE 02/18/2023 0137   KETONESUR NEGATIVE 02/18/2023 0137   PROTEINUR 30 (A) 02/18/2023 0137    UROBILINOGEN 1.0 09/28/2014 1645   NITRITE NEGATIVE 02/18/2023 0137   LEUKOCYTESUR NEGATIVE 02/18/2023 0137   Sepsis Labs: Invalid input(s): "PROCALCITONIN", "LACTICIDVEN"  Microbiology: Recent Results (from the past 240 hour(s))  SARS Coronavirus 2 by RT PCR (hospital order, performed in [facility] hospital lab) *cepheid single result test*     Status: None   Collection Time: 02/17/23  3:32 PM  Result Value Ref Range Status   SARS Coronavirus 2 by RT PCR NEGATIVE NEGATIVE Final    Comment: Performed at [facility] Lab, [address], [place], [place] [address]  Culture, Respiratory w Gram Stain     Status: None   Collection Time: 02/17/23  8:36 PM   Specimen: Tracheal Aspirate; Respiratory  Result Value Ref Range Status   Specimen Description TRACHEAL ASPIRATE  Final   Special Requests NONE  Final   Gram Stain   Final    FEW WBC PRESENT, PREDOMINANTLY PMN RARE GRAM POSITIVE COCCI    Culture   Final    Normal respiratory flora-no Staph aureus or Pseudomonas seen Performed at [facility] Lab, [address], [place], [place] [address]    Report Status 02/21/2023 FINAL  Final  Respiratory (~20 pathogens) panel by PCR     Status: None   Collection Time: 02/17/23  8:56 PM   Specimen: Tracheal Aspirate; Respiratory  Result Value Ref Range Status   Adenovirus NOT DETECTED NOT DETECTED Final   Coronavirus 229E NOT DETECTED NOT DETECTED Final    Comment: (NOTE) The Coronavirus on the Respiratory Panel, DOES NOT test for the novel  Coronavirus (2019 nCoV)    Coronavirus HKU1 NOT DETECTED NOT DETECTED Final   Coronavirus NL63 NOT DETECTED NOT DETECTED Final   Coronavirus OC43 NOT DETECTED NOT DETECTED Final   Metapneumovirus NOT DETECTED NOT DETECTED Final   Rhinovirus / Enterovirus NOT DETECTED NOT DETECTED Final   Influenza A NOT DETECTED NOT DETECTED Final   Influenza B NOT DETECTED NOT DETECTED Final   Parainfluenza Virus 1 NOT DETECTED NOT DETECTED Final    Parainfluenza Virus 2 NOT DETECTED NOT DETECTED Final   Parainfluenza Virus 3 NOT DETECTED NOT DETECTED Final   Parainfluenza Virus 4 NOT DETECTED NOT DETECTED Final   Respiratory Syncytial Virus NOT DETECTED NOT DETECTED Final   Bordetella pertussis NOT DETECTED NOT DETECTED Final   Bordetella Parapertussis NOT DETECTED NOT DETECTED Final   Chlamydophila pneumoniae NOT DETECTED NOT DETECTED Final   Mycoplasma pneumoniae NOT DETECTED NOT DETECTED Final    Comment: Performed at [facility] Lab, [address] [place], [place] [address]  MRSA Next Gen by PCR, Nasal     Status: None   Collection Time: 02/17/23  9:00 PM  Result Value Ref Range Status   MRSA by PCR Next Gen NOT DETECTED NOT DETECTED Final    Comment: (NOTE) The GeneXpert MRSA Assay (FDA approved for NASAL specimens only), is one component of a comprehensive MRSA colonization surveillance program. It is not intended to diagnose MRSA infection nor to guide or monitor treatment for MRSA infections. Test performance is not FDA approved in patients less than 68 years old. Performed at [facility] Lab, [address] [place], [place] [address]   Culture, blood (Routine X 2) w Reflex to ID Panel  Status: None   Collection Time: 02/17/23 11:36 PM   Specimen: BLOOD RIGHT HAND  Result Value Ref Range Status   Specimen Description BLOOD RIGHT HAND  Final   Special Requests   Final    BOTTLES DRAWN AEROBIC AND ANAEROBIC Blood Culture adequate volume   Culture   Final    NO GROWTH 5 DAYS Performed at [facility], [address], [place], [place] [address]    Report Status 02/22/2023 FINAL  Final  Culture, blood (Routine X 2) w Reflex to ID Panel     Status: None   Collection Time: 02/17/23 11:36 PM   Specimen: BLOOD RIGHT HAND  Result Value Ref Range Status   Specimen Description BLOOD RIGHT HAND  Final   Special Requests   Final    BOTTLES DRAWN AEROBIC AND ANAEROBIC Blood Culture adequate volume   Culture    Final    NO GROWTH 5 DAYS Performed at [facility] Lab, [address] [place], [place] [address]    Report Status 02/22/2023 FINAL  Final  Culture, Respiratory w Gram Stain     Status: None   Collection Time: 02/20/23  9:21 AM   Specimen: Tracheal Aspirate; Respiratory  Result Value Ref Range Status   Specimen Description TRACHEAL ASPIRATE  Final   Special Requests NONE  Final   Gram Stain NO WBC SEEN NO ORGANISMS SEEN   Final   Culture   Final    RARE Normal respiratory flora-no Staph aureus or Pseudomonas seen Performed at [facility] Lab, [address] [place], [place] [address]    Report Status 02/22/2023 FINAL  Final  SARS Coronavirus 2 by RT PCR (hospital order, performed in [facility] hospital lab) *cepheid single result test* Anterior Nasal Swab     Status: None   Collection Time: 02/20/23 10:35 AM   Specimen: Anterior Nasal Swab  Result Value Ref Range Status   SARS Coronavirus 2 by RT PCR NEGATIVE NEGATIVE Final    Comment: Performed at [facility] Lab, [address], [place], [place] [address]  Respiratory (~20 pathogens) panel by PCR     Status: None   Collection Time: 02/20/23 10:35 AM   Specimen: Nasopharyngeal Swab; Respiratory  Result Value Ref Range Status   Adenovirus NOT DETECTED NOT DETECTED Final   Coronavirus 229E NOT DETECTED NOT DETECTED Final    Comment: (NOTE) The Coronavirus on the Respiratory Panel, DOES NOT test for the novel  Coronavirus (2019 nCoV)    Coronavirus HKU1 NOT DETECTED NOT DETECTED Final   Coronavirus NL63 NOT DETECTED NOT DETECTED Final   Coronavirus OC43 NOT DETECTED NOT DETECTED Final   Metapneumovirus NOT DETECTED NOT DETECTED Final   Rhinovirus / Enterovirus NOT DETECTED NOT DETECTED Final   Influenza A NOT DETECTED NOT DETECTED Final   Influenza B NOT DETECTED NOT DETECTED Final   Parainfluenza Virus 1 NOT DETECTED NOT DETECTED Final   Parainfluenza Virus 2 NOT DETECTED NOT DETECTED Final   Parainfluenza Virus  3 NOT DETECTED NOT DETECTED Final   Parainfluenza Virus 4 NOT DETECTED NOT DETECTED Final   Respiratory Syncytial Virus NOT DETECTED NOT DETECTED Final   Bordetella pertussis NOT DETECTED NOT DETECTED Final   Bordetella Parapertussis NOT DETECTED NOT DETECTED Final   Chlamydophila pneumoniae NOT DETECTED NOT DETECTED Final   Mycoplasma pneumoniae NOT DETECTED NOT DETECTED Final    Comment: Performed at [facility] Lab, [address], [place], [place] [address]  Radiology Studies: No results found.    Wilder Kurowski T. [facility]  If 7PM-7AM, please contact night-coverage [email or web address] 02/27/2023, 11:49 AM

## 2023-02-27 NOTE — Progress Notes (Signed)
Bedside RN and 16M/16M Charge RN attempted to call report. 3W bedside nurse not available to take report at this time and per 3W charge RN room is not clean yet.

## 2023-02-27 NOTE — Progress Notes (Signed)
DAILY PROGRESS NOTE   Patient Name: Xavier [name] Date of Encounter: 02/27/2023 Cardiologist: None  Chief Complaint   Thirsty  Patient Profile   Xavier [name] is a 82 y.o. male with a hx of PAD, DM2, CKD, HLD, HTN, admitted 02/17/23 with CVA who is being seen 02/26/2023 for the evaluation of afib at the request of Dr Alanda Slim.   Subjective   Xavier [name] seems to be feeling reasonably well.  He was sitting upright in the ICU but does have nasal oxygen support as well as a feeding tube in place.  He has been predominantly in sinus rhythm overnight on IV amiodarone.  No long pauses were noted.  Metoprolol was stopped.  Remains on Brilinta and low-dose Eliquis.  Objective   Vitals:   02/27/23 0340 02/27/23 0400 02/27/23 0500 02/27/23 0808  BP:  (!) 146/49 (!) 159/54   Pulse:      Resp:  19 18   Temp: 99.2 F (37.3 C)   98.7 F (37.1 C)  TempSrc: Oral   Axillary  SpO2:  95% 97%   Weight:   80.6 kg   Height:        Intake/Output Summary (Last 24 hours) at 02/27/2023 1040 Last data filed at 02/27/2023 0900 Gross per 24 hour  Intake 847.28 ml  Output 1215 ml  Net -367.72 ml   Filed Weights   02/24/23 0500 02/25/23 0403 02/27/23 0500  Weight: 81.1 kg 79.4 kg 80.6 kg    Physical Exam   General appearance: alert and no distress Neck: no carotid bruit, no JVD, and thyroid not enlarged, symmetric, no tenderness/mass/nodules Lungs: diminished breath sounds bibasilar Heart: regular rate and rhythm Abdomen: soft, non-tender; bowel sounds normal; no masses,  no organomegaly Extremities: extremities normal, atraumatic, no cyanosis or edema Pulses: 2+ and symmetric Skin: Skin color, texture, turgor normal. No rashes or lesions Neurologic: Mental status: Awake, follows commands Psych: Pleasant  Inpatient Medications    Scheduled Meds:  acetaminophen  650 mg Per Tube Q6H   Or   acetaminophen  650 mg Rectal Q6H   apixaban  2.5 mg Per Tube BID   atorvastatin  80 mg Per  Tube Daily   Chlorhexidine Gluconate Cloth  6 each Topical Q0600   docusate  100 mg Per Tube BID   doxazosin  1 mg Per Tube Daily   insulin aspart  0-20 Units Subcutaneous Q4H   metoCLOPramide (REGLAN) injection  5 mg Intravenous Q8H   mouth rinse  15 mL Mouth Rinse 4 times per day   QUEtiapine  25 mg Per Tube QHS   ticagrelor  90 mg Per Tube BID    Continuous Infusions:  sodium chloride Stopped (02/20/23 2337)   amiodarone 30 mg/hr (02/27/23 0525)   feeding supplement (GLUCERNA 1.5 CAL) 20 mL/hr at 02/27/23 0400    PRN Meds: iohexol, ipratropium-albuterol, metoprolol tartrate, nitroGLYCERIN, ondansetron (ZOFRAN) IV, mouth rinse, polyethylene glycol, senna-docusate   Labs   Results for orders placed or performed during the hospital encounter of 02/17/23 (from the past 48 hour(s))  Glucose, capillary     Status: Abnormal   Collection Time: 02/25/23 11:21 AM  Result Value Ref Range   Glucose-Capillary 288 (H) 70 - 99 mg/dL    Comment: Glucose reference range applies only to samples taken after fasting for at least 8 hours.  Glucose, capillary     Status: Abnormal   Collection Time: 02/25/23  3:17 PM  Result Value Ref Range   Glucose-Capillary  345 (H) 70 - 99 mg/dL    Comment: Glucose reference range applies only to samples taken after fasting for at least 8 hours.  Glucose, capillary     Status: Abnormal   Collection Time: 02/25/23  7:16 PM  Result Value Ref Range   Glucose-Capillary 265 (H) 70 - 99 mg/dL    Comment: Glucose reference range applies only to samples taken after fasting for at least 8 hours.  Troponin I (High Sensitivity)     Status: Abnormal   Collection Time: 02/25/23  8:51 PM  Result Value Ref Range   Troponin I (High Sensitivity) 140 (HH) <18 ng/L    Comment: CRITICAL RESULT CALLED TO, READ BACK BY AND VERIFIED WITH RUIZ, J. RN @2129  02/25/23 SATRAINR (NOTE) Elevated high sensitivity troponin I (hsTnI) values and significant  changes across serial  measurements may suggest ACS but many other  chronic and acute conditions are known to elevate hsTnI results.  Refer to the "Links" section for chest pain algorithms and additional  guidance. Performed at [facility], [address] [place], [place] [address]   Magnesium     Status: Abnormal   Collection Time: 02/25/23  9:57 PM  Result Value Ref Range   Magnesium 3.6 (H) 1.7 - 2.4 mg/dL    Comment: Performed at [facility], [address] [place], [place] [address]  Troponin I (High Sensitivity)     Status: Abnormal   Collection Time: 02/25/23  9:57 PM  Result Value Ref Range   Troponin I (High Sensitivity) 133 (HH) <18 ng/L    Comment: CRITICAL VALUE NOTED. VALUE IS CONSISTENT WITH PREVIOUSLY REPORTED/CALLED VALUE (NOTE) Elevated high sensitivity troponin I (hsTnI) values and significant  changes across serial measurements may suggest ACS but many other  chronic and acute conditions are known to elevate hsTnI results.  Refer to the "Links" section for chest pain algorithms and additional  guidance. Performed at [facility], [address], [place], [place] [address]   Glucose, capillary     Status: Abnormal   Collection Time: 02/25/23 11:17 PM  Result Value Ref Range   Glucose-Capillary 38 (LL) 70 - 99 mg/dL    Comment: Glucose reference range applies only to samples taken after fasting for at least 8 hours.   Comment 1 Document in Chart   Glucose, capillary     Status: Abnormal   Collection Time: 02/25/23 11:21 PM  Result Value Ref Range   Glucose-Capillary 38 (LL) 70 - 99 mg/dL    Comment: Glucose reference range applies only to samples taken after fasting for at least 8 hours.   Comment 1 Document in Chart   Glucose, capillary     Status: Abnormal   Collection Time: 02/25/23 11:46 PM  Result Value Ref Range   Glucose-Capillary 132 (H) 70 - 99 mg/dL    Comment: Glucose reference range applies only to samples taken after fasting for at least 8 hours.   Comprehensive metabolic panel     Status: Abnormal   Collection Time: 02/26/23  2:09 AM  Result Value Ref Range   Sodium 153 (H) 135 - 145 mmol/L   Potassium 3.6 3.5 - 5.1 mmol/L   Chloride 116 (H) 98 - 111 mmol/L   CO2 24 22 - 32 mmol/L   Glucose, Bld 96 70 - 99 mg/dL    Comment: Glucose reference range applies only to samples taken after fasting for at least 8 hours.   BUN 102 (H) 8 -  23 mg/dL   Creatinine, Ser 8.65 (H) 0.61 - 1.24 mg/dL   Calcium 8.6 (L) 8.9 - 10.3 mg/dL   Total Protein 6.9 6.5 - 8.1 g/dL   Albumin 2.6 (L) 3.5 - 5.0 g/dL   AST 37 15 - 41 U/L   ALT 51 (H) 0 - 44 U/L   Alkaline Phosphatase 75 38 - 126 U/L   Total Bilirubin 0.6 0.3 - 1.2 mg/dL   GFR, Estimated 21 (L) >60 mL/min    Comment: (NOTE) Calculated using the CKD-EPI Creatinine Equation (2021)    Anion gap 13 5 - 15    Comment: Performed at [facility], [address], [place], [place] [address]  Phosphorus     Status: None   Collection Time: 02/26/23  2:09 AM  Result Value Ref Range   Phosphorus 4.4 2.5 - 4.6 mg/dL    Comment: Performed at [facility], [address], [place], [place] [address]  Magnesium     Status: Abnormal   Collection Time: 02/26/23  2:09 AM  Result Value Ref Range   Magnesium 3.5 (H) 1.7 - 2.4 mg/dL    Comment: Performed at [facility], [address], [place], [place] [address]  CBC     Status: Abnormal   Collection Time: 02/26/23  2:09 AM  Result Value Ref Range   WBC 22.0 (H) 4.0 - 10.5 K/uL   RBC 3.45 (L) 4.22 - 5.81 MIL/uL   Hemoglobin 9.1 (L) 13.0 - 17.0 g/dL   HCT 44.0 (L) 10.2 - 72.5 %   MCV 87.0 80.0 - 100.0 fL   MCH 26.4 26.0 - 34.0 pg   MCHC 30.3 30.0 - 36.0 g/dL   RDW 36.6 44.0 - 34.7 %   Platelets 280 150 - 400 K/uL   nRBC 0.0 0.0 - 0.2 %    Comment: Performed at [facility], [address], [place], [place] [address]  Brain natriuretic peptide     Status: Abnormal   Collection Time: 02/26/23  2:09 AM  Result Value Ref Range    B Natriuretic Peptide 834.0 (H) 0.0 - 100.0 pg/mL    Comment: Performed at [facility], [address], [place], [place] [address]  TSH     Status: Abnormal   Collection Time: 02/26/23  2:09 AM  Result Value Ref Range   TSH 4.786 (H) 0.350 - 4.500 uIU/mL    Comment: Performed by a 3rd Generation assay with a functional sensitivity of <=0.01 uIU/mL. Performed at [facility], [address] [place], [place] [address]   Troponin I (High Sensitivity)     Status: Abnormal   Collection Time: 02/26/23  2:09 AM  Result Value Ref Range   Troponin I (High Sensitivity) 146 (HH) <18 ng/L    Comment: CRITICAL VALUE NOTED. VALUE IS CONSISTENT WITH PREVIOUSLY REPORTED/CALLED VALUE (NOTE) Elevated high sensitivity troponin I (hsTnI) values and significant  changes across serial measurements may suggest ACS but many other  chronic and acute conditions are known to elevate hsTnI results.  Refer to the "Links" section for chest pain algorithms and additional  guidance. Performed at [facility], [address] [place], [place] [address]   Osmolality     Status: Abnormal   Collection Time: 02/26/23  2:09 AM  Result Value Ref Range   Osmolality 359 (HH) 275 - 295 mOsm/kg    Comment: CRITICAL RESULT CALLED TO, READ BACK BY AND VERIFIED WITH:  J.RUIZ RN 02/26/23 @0404  BY J. WHITE Performed at [facility], [address] [place], [place] [address]   T4, free     Status: None   Collection Time: 02/26/23  2:09 AM  Result Value Ref Range   Free T4 0.83 0.61 - 1.12 ng/dL    Comment: (NOTE) Biotin ingestion may interfere with free T4 tests. If the results are inconsistent with the TSH level, previous test results, or the clinical presentation, then consider biotin interference. If needed, order repeat testing after stopping biotin. Performed at [facility], [address], [place], [place] [address]   T3, free     Status: Abnormal   Collection Time: 02/26/23  2:09 AM   Result Value Ref Range   T3, Free 1.6 (L) 2.0 - 4.4 pg/mL    Comment: (NOTE) Performed At: [facility] [address] [place], [place] [id number] Jolene Schimke MD QM:[id number]   Glucose, capillary     Status: None   Collection Time: 02/26/23  3:19 AM  Result Value Ref Range   Glucose-Capillary 99 70 - 99 mg/dL    Comment: Glucose reference range applies only to samples taken after fasting for at least 8 hours.  Osmolality, urine     Status: None   Collection Time: 02/26/23  4:08 AM  Result Value Ref Range   Osmolality, Ur 577 300 - 900 mOsm/kg    Comment: Performed at [facility], [address], [place], [place] [address]  Sodium, urine, random     Status: None   Collection Time: 02/26/23  4:08 AM  Result Value Ref Range   Sodium, Ur <10 mmol/L    Comment: Performed at [facility], [address], [place], [place] [address]  Glucose, capillary     Status: Abnormal   Collection Time: 02/26/23  7:35 AM  Result Value Ref Range   Glucose-Capillary 146 (H) 70 - 99 mg/dL    Comment: Glucose reference range applies only to samples taken after fasting for at least 8 hours.  Glucose, capillary     Status: Abnormal   Collection Time: 02/26/23 12:13 PM  Result Value Ref Range   Glucose-Capillary 157 (H) 70 - 99 mg/dL    Comment: Glucose reference range applies only to samples taken after fasting for at least 8 hours.  Renal function panel     Status: Abnormal   Collection Time: 02/26/23  2:55 PM  Result Value Ref Range   Sodium 148 (H) 135 - 145 mmol/L   Potassium 3.4 (L) 3.5 - 5.1 mmol/L   Chloride 115 (H) 98 - 111 mmol/L   CO2 22 22 - 32 mmol/L   Glucose, Bld 255 (H) 70 - 99 mg/dL    Comment: Glucose reference range applies only to samples taken after fasting for at least 8 hours.   BUN 105 (H) 8 - 23 mg/dL   Creatinine, Ser 0.27 (H) 0.61 - 1.24 mg/dL   Calcium 8.2 (L) 8.9 - 10.3 mg/dL   Phosphorus 5.0 (H) 2.5 - 4.6 mg/dL   Albumin 2.2 (L) 3.5 - 5.0  g/dL   GFR, Estimated 24 (L) >60 mL/min    Comment: (NOTE) Calculated using the CKD-EPI Creatinine Equation (2021)    Anion gap 11 5 - 15    Comment: Performed at [facility], [address], [place], [place] [address]  Glucose, capillary     Status: Abnormal   Collection Time: 02/26/23  3:55 PM  Result Value Ref Range   Glucose-Capillary 250 (H) 70 - 99 mg/dL    Comment: Glucose reference range applies only to samples taken after fasting for at least 8 hours.  Glucose, capillary     Status: Abnormal   Collection Time: 02/26/23  8:02 PM  Result Value Ref Range   Glucose-Capillary 226 (H) 70 - 99 mg/dL    Comment: Glucose reference range applies only to samples taken after fasting for at least 8 hours.  Glucose, capillary     Status: Abnormal   Collection Time: 02/26/23 11:55 PM  Result Value Ref Range   Glucose-Capillary 197 (H) 70 - 99 mg/dL    Comment: Glucose reference range applies only to samples taken after fasting for at least 8 hours.  Renal function panel     Status: Abnormal   Collection Time: 02/27/23  2:07 AM  Result Value Ref Range   Sodium 148 (H) 135 - 145 mmol/L   Potassium 3.7 3.5 - 5.1 mmol/L   Chloride 114 (H) 98 - 111 mmol/L   CO2 22 22 - 32 mmol/L   Glucose, Bld 185 (H) 70 - 99 mg/dL    Comment: Glucose reference range applies only to samples taken after fasting for at least 8 hours.   BUN 106 (H) 8 - 23 mg/dL   Creatinine, Ser 9.81 (H) 0.61 - 1.24 mg/dL   Calcium 8.2 (L) 8.9 - 10.3 mg/dL   Phosphorus 4.7 (H) 2.5 - 4.6 mg/dL   Albumin 2.3 (L) 3.5 - 5.0 g/dL   GFR, Estimated 25 (L) >60 mL/min    Comment: (NOTE) Calculated using the CKD-EPI Creatinine Equation (2021)    Anion gap 12 5 - 15    Comment: Performed at [facility], [address] [place], [place] [address]  CBC with Differential/Platelet     Status: Abnormal   Collection Time: 02/27/23  2:07 AM  Result Value Ref Range   WBC 19.0 (H) 4.0 - 10.5 K/uL   RBC 3.21 (L) 4.22 - 5.81  MIL/uL   Hemoglobin 8.2 (L) 13.0 - 17.0 g/dL   HCT 82.9 (L) 56.2 - 13.0 %   MCV 86.6 80.0 - 100.0 fL   MCH 25.5 (L) 26.0 - 34.0 pg   MCHC 29.5 (L) 30.0 - 36.0 g/dL   RDW 86.5 78.4 - 69.6 %   Platelets 286 150 - 400 K/uL   nRBC 0.0 0.0 - 0.2 %   Neutrophils Relative % 89 %   Neutro Abs 17.1 (H) 1.7 - 7.7 K/uL   Lymphocytes Relative 6 %   Lymphs Abs 1.1 0.7 - 4.0 K/uL   Monocytes Relative 3 %   Monocytes Absolute 0.5 0.1 - 1.0 K/uL   Eosinophils Relative 1 %   Eosinophils Absolute 0.1 0.0 - 0.5 K/uL   Basophils Relative 0 %   Basophils Absolute 0.0 0.0 - 0.1 K/uL   Immature Granulocytes 1 %   Abs Immature Granulocytes 0.15 (H) 0.00 - 0.07 K/uL    Comment: Performed at [facility], [address], [place], [place] [address]  Magnesium     Status: Abnormal   Collection Time: 02/27/23  2:07 AM  Result Value Ref Range   Magnesium 3.4 (H) 1.7 - 2.4 mg/dL    Comment: Performed at [facility], [address], [place], [place] [address]  Glucose, capillary     Status: Abnormal   Collection Time: 02/27/23  3:36 AM  Result Value Ref Range  Glucose-Capillary 156 (H) 70 - 99 mg/dL    Comment: Glucose reference range applies only to samples taken after fasting for at least 8 hours.  Glucose, capillary     Status: Abnormal   Collection Time: 02/27/23  8:03 AM  Result Value Ref Range   Glucose-Capillary 185 (H) 70 - 99 mg/dL    Comment: Glucose reference range applies only to samples taken after fasting for at least 8 hours.    ECG   N/A  Telemetry   Sinus rhythm- Personally Reviewed  Radiology    No results found.  Cardiac Studies   ECHOCARDIOGRAM REPORT       Patient Name:   Xavier [name] Date of Exam: 02/18/2023  Medical Rec #:  [id number]        Height:       74.0 in  Accession #:    [id number]       Weight:       186.1 lb  Date of Birth:  August 07, 1940        BSA:          2.108 m  Patient Age:    81 years         BP:           148/53 mmHg  Patient  Gender: M                HR:           54 bpm.  Exam Location:  Inpatient   Procedure: 2D Echo, Color Doppler and Cardiac Doppler   Indications:    stroke    History:        Patient has prior history of Echocardiogram examinations,  most                 recent 04/30/2020. Chronic kidney disease,                  Signs/Symptoms:Murmur; Risk Factors:Hypertension,  Dyslipidemia                 and Diabetes.    Sonographer:    Delcie Roch RDCS  Referring Phys: [id number] Reyne Dumas [facility]     Sonographer Comments: Technically difficult study due to poor echo windows  and echo performed with patient supine and on artificial respirator. Image  acquisition challenging due to respiratory motion.  IMPRESSIONS     1. Left ventricular ejection fraction, by estimation, is 55 to 60%. The  left ventricle has normal function. The left ventricle has no regional  wall motion abnormalities. There is moderate concentric left ventricular  hypertrophy. Left ventricular  diastolic parameters are consistent with Grade II diastolic dysfunction  (pseudonormalization).   2. Right ventricular systolic function is normal. The right ventricular  size is not well visualized. There is normal pulmonary artery systolic  pressure.   3. A small pericardial effusion is present. The pericardial effusion is  anterior to the right ventricle. There is no evidence of cardiac  tamponade.   4. The mitral valve is degenerative. No evidence of mitral valve  regurgitation.   5. The aortic valve is tricuspid. There is moderate calcification of the  aortic valve. Aortic valve regurgitation is not visualized. Aortic valve  sclerosis is present, with no evidence of aortic valve stenosis.   6. The inferior vena cava is dilated in size with >50% respiratory  variability, suggesting right atrial pressure of 8 mmHg.   Comparison(s): No significant change from prior study. Prior  images  reviewed side by side.     Assessment   Principal Problem:   Acute ischemic stroke [facility]) Active Problems:   Type II diabetes mellitus with renal manifestations (HCC)   Acute renal failure superimposed on stage 3 chronic kidney disease (HCC)   Chronic kidney disease due to hypertension   Acute respiratory failure with hypoxia (HCC)   Pneumonia due to COVID-19 virus   Aspiration pneumonia of both lungs due to gastric secretions (HCC)   Influenza   Paroxysmal atrial fibrillation [facility])   Dysphagia   Plan   Xavier [name] was seen yesterday for new onset paroxysmal atrial fibrillation.  A-fib rates were fast however on beta-blocker he had termination pauses.  His Lopressor was changed to amiodarone and he is done well without overnight.  He is maintaining sinus rhythm.  Echo showed normal LVEF with grade 2 diastolic dysfunction.  He was given a dose of Lasix yesterday.  On exam today he does not appear volume overloaded.  He is recorded about 500 cc negative but overall 2.9 L positive.  He was previously on Dyazide at home.  He has been getting free water as well feeds.  Will hold on additional Lasix at this time.  Continue IV amiodarone and transition to oral amiodarone once able.  Time Spent Directly with Patient:  I have spent a total of 35 minutes with the patient reviewing hospital notes, telemetry, EKGs, labs and examining the patient as well as establishing an assessment and plan that was discussed personally with the patient.  > 50% of time was spent in direct patient care.  Length of Stay:  LOS: 10 days   Chrystie Nose, MD, [facility], FACP  [place]  [facility]  Medical Director of the [facility] Lipid [facility]  Cardiovascular Risk Reduction Clinic Diplomate of the American Board of Clinical Lipidology Attending Cardiologist  Direct Dial: [phone]  Fax: [phone]  Website:  [email or web address] Orren [email or web address] 02/27/2023, 10:40 AM

## 2023-02-27 NOTE — Progress Notes (Signed)
Physical Therapy Treatment Patient Details Name: Xavier [name] MRN: [id number] DOB: 12-07-1940 Today's Date: 02/27/2023   History of Present Illness Patient is 82 y.o. male who presented 8/9 after acute onset left-sided weakness, facial droop, and aphasia. 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 8/9 pt s/p TNK and thrombectomy and revascularization with stent in MCA M1 segment. Pt intubated for airway protection, antibiotics started for aspiration, antivirals started for COVID and flu on 8/9. Pt failed extubation on 8/11 and re-intubated. Successful extubation to [facility] on 8/13. PMH significant for DM, HTN, prostate cancer.    PT Comments  Pt greeted resting in bed and agreeable to session with slow but steady progress towards goals, however pt limited by fatigue, impaired cognition, decreased coordination, and decreased activity tolerance. Pt able to come to sitting EOB with mod A to manage Les and elevate trunk with pt demonstrating improved ability to follow sequencing commands to reach for rail and assist to elevate trunk. Pt able to attempt standing x3 with anterior assist with pt able to generate power through [place] this session, however pt needing max A to clear hips and pt unable to elevate trunk to come to full upright standing. Pt continues to attempt to impulsively lie down during session stating he is tired, needing max cues for continued participation and education on importance of time up, suspect due to impaired cognition and poor processing. Current plan remains appropriate to address deficits and maximize functional independence and decrease caregiver burden. Pt continues to benefit from skilled PT services to progress toward functional mobility goals.      If plan is discharge home, recommend the following: Two people to help with walking and/or transfers;Two people to help with bathing/dressing/bathroom;Assistance with  cooking/housework;Assistance with feeding;Direct supervision/assist for medications management;Assist for transportation;Help with stairs or ramp for entrance;Supervision due to cognitive status   Can travel by private vehicle     No  Equipment Recommendations   (TBD)    Recommendations for Other Services       Precautions / Restrictions Precautions Precautions: Fall Precaution Comments: HFNC; coretrack; flexiseal as of 8/16 Restrictions Weight Bearing Restrictions: No     Mobility  Bed Mobility Overal bed mobility: Needs Assistance Bed Mobility: Rolling, Sidelying to Sit, Sit to Supine Rolling: Min assist   Supine to sit: Mod assist Sit to supine: Mod assist   General bed mobility comments: Needing step by step cues to come to EOB. Poor coordination requiring mod A to come to EOB and mod A to return to bed. able to roll to R to place pillows under sacrum at end of session    Transfers Overall transfer level: Needs assistance Equipment used: 1 person hand held assist Transfers: Sit to/from Stand, Bed to chair/wheelchair/BSC Sit to Stand: Max assist           General transfer comment: Max assist to attempt and pt with better initiation, cotnineud poor hip/knee extension to rise. able to attempt x3 with pt unable to elevate trunk in standing    Ambulation/Gait               General Gait Details: unable   Stairs             Wheelchair Mobility     Tilt Bed    Modified Rankin (Stroke Patients Only)       Balance Overall balance assessment: Needs assistance Sitting-balance support: Bilateral upper extremity supported,  Feet supported Sitting balance-Leahy Scale: Poor Sitting balance - Comments: Min A to maintain static sit EOB with pt attempting to impulsively return to supine   Standing balance support: Bilateral upper extremity supported Standing balance-Leahy Scale: Zero                              Cognition Arousal:  Alert Behavior During Therapy: Restless, Flat affect Overall Cognitive Status: Impaired/Different from baseline Area of Impairment: Memory, Following commands, Safety/judgement, Awareness, Problem solving                     Memory: Decreased short-term memory Following Commands: Follows one step commands inconsistently, Follows one step commands with increased time Safety/Judgement: Decreased awareness of safety (Poor awareness of location of LUE and body control) Awareness: Intellectual (unaware of BM) Problem Solving: Slow processing, Decreased initiation, Difficulty sequencing, Requires verbal cues General Comments: Pt following one step commands with incresed time and cues. Pt with poor awareness of safety with all mobility requiring nearly constant cues.        Exercises      General Comments General comments (skin integrity, edema, etc.): VSS on 10L HFNC      Pertinent Vitals/Pain Pain Assessment Pain Assessment: Faces Faces Pain Scale: Hurts little more Pain Location: bottom with bed mobility Pain Descriptors / Indicators: Grimacing, Sore Pain Intervention(s): Monitored during session, Limited activity within patient's tolerance, Repositioned    Home Living                          Prior Function            PT Goals (current goals can now be found in the care plan section) Acute Rehab PT Goals Patient Stated Goal: regain function PT Goal Formulation: With family Time For Goal Achievement: 03/08/23 Progress towards PT goals: Progressing toward goals    Frequency    Min 1X/week      PT Plan      Co-evaluation              AM-PAC PT "6 Clicks" Mobility   Outcome Measure  Help needed turning from your back to your side while in a flat bed without using bedrails?: Total Help needed moving from lying on your back to sitting on the side of a flat bed without using bedrails?: Total Help needed moving to and from a bed to a chair  (including a wheelchair)?: Total Help needed standing up from a chair using your arms (e.g., wheelchair or bedside chair)?: Total Help needed to walk in hospital room?: Total Help needed climbing 3-5 steps with a railing? : Total 6 Click Score: 6    End of Session Equipment Utilized During Treatment: Oxygen Activity Tolerance: Patient tolerated treatment well;Patient limited by fatigue Patient left: in bed;with call bell/phone within reach Nurse Communication: Mobility status PT Visit Diagnosis: Other abnormalities of gait and mobility (R26.89);Muscle weakness (generalized) (M62.81);Difficulty in walking, not elsewhere classified (R26.2);Other symptoms and signs involving the nervous system (R29.898)     Time: 1610-9604 PT Time Calculation (min) (ACUTE ONLY): 23 min  Charges:    $Therapeutic Activity: 23-37 mins PT General Charges $$ ACUTE PT VISIT: 1 Visit                     Pearly Apachito R. PTA Acute Rehabilitation Services Office: [phone]   Catalina Antigua 02/27/2023, 4:22 PM

## 2023-02-28 DIAGNOSIS — I639 Cerebral infarction, unspecified: Secondary | ICD-10-CM | POA: Diagnosis not present

## 2023-02-28 DIAGNOSIS — G629 Polyneuropathy, unspecified: Secondary | ICD-10-CM | POA: Insufficient documentation

## 2023-02-28 DIAGNOSIS — I4891 Unspecified atrial fibrillation: Secondary | ICD-10-CM | POA: Diagnosis not present

## 2023-02-28 DIAGNOSIS — J9601 Acute respiratory failure with hypoxia: Secondary | ICD-10-CM | POA: Diagnosis not present

## 2023-02-28 DIAGNOSIS — J690 Pneumonitis due to inhalation of food and vomit: Secondary | ICD-10-CM | POA: Diagnosis not present

## 2023-02-28 LAB — RENAL FUNCTION PANEL
Albumin: 2.2 g/dL — ABNORMAL LOW (ref 3.5–5.0)
Anion gap: 10 (ref 5–15)
BUN: 90 mg/dL — ABNORMAL HIGH (ref 8–23)
CO2: 24 mmol/L (ref 22–32)
Calcium: 8.2 mg/dL — ABNORMAL LOW (ref 8.9–10.3)
Chloride: 112 mmol/L — ABNORMAL HIGH (ref 98–111)
Creatinine, Ser: 2.32 mg/dL — ABNORMAL HIGH (ref 0.61–1.24)
GFR, Estimated: 27 mL/min — ABNORMAL LOW (ref >60)
Glucose, Bld: 159 mg/dL — ABNORMAL HIGH (ref 70–99)
Phosphorus: 4.3 mg/dL (ref 2.5–4.6)
Potassium: 3.9 mmol/L (ref 3.5–5.1)
Sodium: 146 mmol/L — ABNORMAL HIGH (ref 135–145)

## 2023-02-28 LAB — GLUCOSE, CAPILLARY
Glucose-Capillary: 147 mg/dL — ABNORMAL HIGH (ref 70–99)
Glucose-Capillary: 208 mg/dL — ABNORMAL HIGH (ref 70–99)
Glucose-Capillary: 238 mg/dL — ABNORMAL HIGH (ref 70–99)
Glucose-Capillary: 232 mg/dL — ABNORMAL HIGH (ref 70–99)
Glucose-Capillary: 203 mg/dL — ABNORMAL HIGH (ref 70–99)
Glucose-Capillary: 126 mg/dL — ABNORMAL HIGH (ref 70–99)
Glucose-Capillary: 142 mg/dL — ABNORMAL HIGH (ref 70–99)

## 2023-02-28 LAB — CBC
HCT: 27.5 % — ABNORMAL LOW (ref 39.0–52.0)
Hemoglobin: 8.2 g/dL — ABNORMAL LOW (ref 13.0–17.0)
MCH: 25.8 pg — ABNORMAL LOW (ref 26.0–34.0)
MCHC: 29.8 g/dL — ABNORMAL LOW (ref 30.0–36.0)
MCV: 86.5 fL (ref 80.0–100.0)
Platelets: 322 10*3/uL (ref 150–400)
RBC: 3.18 MIL/uL — ABNORMAL LOW (ref 4.22–5.81)
RDW: 14.6 % (ref 11.5–15.5)
WBC: 17.8 10*3/uL — ABNORMAL HIGH (ref 4.0–10.5)
nRBC: 0 % (ref 0.0–0.2)

## 2023-02-28 LAB — MAGNESIUM: Magnesium: 3.3 mg/dL — ABNORMAL HIGH (ref 1.7–2.4)

## 2023-02-28 MED ORDER — HYALURONIDASE HUMAN 150 UNIT/ML IJ SOLN
150.0000 [IU] | Freq: Once | INTRAMUSCULAR | Status: AC
  Administered 2023-02-28: 150 [IU] via SUBCUTANEOUS
  Filled 2023-02-28: qty 1

## 2023-02-28 MED ORDER — INSULIN GLARGINE-YFGN 100 UNIT/ML ~~LOC~~ SOLN
25.0000 [IU] | Freq: Every day | SUBCUTANEOUS | Status: DC
  Administered 2023-03-01 – 2023-03-02 (×2): 25 [IU] via SUBCUTANEOUS
  Filled 2023-02-28 (×3): qty 0.25

## 2023-02-28 MED ORDER — AMIODARONE IV BOLUS ONLY 150 MG/100ML
150.0000 mg | Freq: Once | INTRAVENOUS | Status: AC
  Administered 2023-02-28: 150 mg via INTRAVENOUS
  Filled 2023-02-28: qty 100

## 2023-02-28 MED ORDER — INSULIN ASPART 100 UNIT/ML IJ SOLN
6.0000 [IU] | INTRAMUSCULAR | Status: DC
  Administered 2023-02-28 – 2023-03-01 (×5): 6 [IU] via SUBCUTANEOUS

## 2023-02-28 NOTE — Plan of Care (Signed)
 Problem: Education: Goal: Knowledge of disease or condition will improve Outcome: Progressing Goal: Knowledge of secondary prevention will improve (MUST DOCUMENT ALL) Outcome: Progressing Goal: Knowledge of patient specific risk factors will improve Loraine Leriche N/A or DELETE if not current risk factor) Outcome: Progressing   Problem: Ischemic Stroke/TIA Tissue Perfusion: Goal: Complications of ischemic stroke/TIA will be minimized Outcome: Progressing   Problem: Coping: Goal: Will verbalize positive feelings about self Outcome: Progressing Goal: Will identify appropriate support needs Outcome: Progressing   Problem: Health Behavior/Discharge Planning: Goal: Ability to manage health-related needs will improve Outcome: Progressing Goal: Goals will be collaboratively established with patient/family Outcome: Progressing   Problem: Self-Care: Goal: Ability to participate in self-care as condition permits will improve Outcome: Progressing Goal: Verbalization of feelings and concerns over difficulty with self-care will improve Outcome: Progressing Goal: Ability to communicate needs accurately will improve Outcome: Progressing   Problem: Nutrition: Goal: Risk of aspiration will decrease Outcome: Progressing Goal: Dietary intake will improve Outcome: Progressing   Problem: Education: Goal: Understanding of CV disease, CV risk reduction, and recovery process will improve Outcome: Progressing Goal: Individualized Educational Video(s) Outcome: Progressing   Problem: Activity: Goal: Ability to return to baseline activity level will improve Outcome: Progressing   Problem: Cardiovascular: Goal: Ability to achieve and maintain adequate cardiovascular perfusion will improve Outcome: Progressing Goal: Vascular access site(s) Level 0-1 will be maintained Outcome: Progressing   Problem: Health Behavior/Discharge Planning: Goal: Ability to safely manage health-related needs after  discharge will improve Outcome: Progressing   Problem: Education: Goal: Knowledge of General Education information will improve Description: Including pain rating scale, medication(s)/side effects and non-pharmacologic comfort measures Outcome: Progressing   Problem: Health Behavior/Discharge Planning: Goal: Ability to manage health-related needs will improve Outcome: Progressing   Problem: Clinical Measurements: Goal: Ability to maintain clinical measurements within normal limits will improve Outcome: Progressing Goal: Will remain free from infection Outcome: Progressing Goal: Diagnostic test results will improve Outcome: Progressing Goal: Respiratory complications will improve Outcome: Progressing Goal: Cardiovascular complication will be avoided Outcome: Progressing   Problem: Activity: Goal: Risk for activity intolerance will decrease Outcome: Progressing   Problem: Nutrition: Goal: Adequate nutrition will be maintained Outcome: Progressing   Problem: Coping: Goal: Level of anxiety will decrease Outcome: Progressing   Problem: Elimination: Goal: Will not experience complications related to bowel motility Outcome: Progressing Goal: Will not experience complications related to urinary retention Outcome: Progressing   Problem: Pain Managment: Goal: General experience of comfort will improve Outcome: Progressing   Problem: Safety: Goal: Ability to remain free from injury will improve Outcome: Progressing   Problem: Skin Integrity: Goal: Risk for impaired skin integrity will decrease Outcome: Progressing   Problem: Activity: Goal: Ability to tolerate increased activity will improve Outcome: Progressing   Problem: Respiratory: Goal: Ability to maintain a clear airway and adequate ventilation will improve Outcome: Progressing   Problem: Role Relationship: Goal: Method of communication will improve Outcome: Progressing   Problem: Education: Goal:  Knowledge of risk factors and measures for prevention of condition will improve Outcome: Progressing   Problem: Coping: Goal: Psychosocial and spiritual needs will be supported Outcome: Progressing   Problem: Respiratory: Goal: Will maintain a patent airway Outcome: Progressing Goal: Complications related to the disease process, condition or treatment will be avoided or minimized Outcome: Progressing   Problem: Education: Goal: Ability to describe self-care measures that may prevent or decrease complications (Diabetes Survival Skills Education) will improve Outcome: Progressing Goal: Individualized Educational Video(s) Outcome: Progressing   Problem: Coping: Goal: Ability to adjust  to condition or change in health will improve Outcome: Progressing   Problem: Fluid Volume: Goal: Ability to maintain a balanced intake and output will improve Outcome: Progressing   Problem: Health Behavior/Discharge Planning: Goal: Ability to identify and utilize available resources and services will improve Outcome: Progressing Goal: Ability to manage health-related needs will improve Outcome: Progressing

## 2023-02-28 NOTE — Progress Notes (Signed)
Patient transferred to 3W26 without complications. [name] notified.

## 2023-02-28 NOTE — Progress Notes (Signed)
SLP Cancellation Note  Patient Details Name: Xavier [name] MRN: [id number] DOB: 16-Jun-1941   Cancelled treatment:       Reason Eval/Treat Not Completed: Patient declined, no reason specified. Patient took one sip of water and then declined further pos. Will f/u next date.   Ferdinand Lango MA, CCC-SLP    [name] [name] 02/28/2023, 2:40 PM

## 2023-02-28 NOTE — Progress Notes (Signed)
   02/28/23 1054  Urine Characteristics  Urinary Interventions (S)  Intermittent/Straight cath  Intermittent/Straight Cath (mL) 750 mL  Intermittent Catheter Size 16  Post Void Cath Residual (mL) 0 mL  Hygiene Peri care

## 2023-02-28 NOTE — Progress Notes (Signed)
   02/28/23 0954  External Urinary Catheter  Placement Date/Time: 02/27/23 1500   Person Placing LDA: Henry Russel RN  External Urinary Catheter Type: Male External Catheter with Suction  Pubic hair clipped (verbal consent obtained): N/A  Output (mL) 0 mL  Urine Characteristics  Bladder Scan Volume (mL) 633 mL     No urine noted in External System, bladder scan performed. 633 noted in bladder. Dr. Alanda Slim notified.

## 2023-02-28 NOTE — Progress Notes (Signed)
Occupational Therapy Treatment Patient Details Name: Xavier [name] MRN: [id number] DOB: 1940/12/18 Today's Date: 02/28/2023   History of present illness Patient is 82 y.o. male who presented 8/9 after acute onset left-sided weakness, facial droop, and aphasia. 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 8/9 pt s/p TNK and thrombectomy and revascularization with stent in MCA M1 segment. Pt intubated for airway protection, antibiotics started for aspiration, antivirals started for COVID and flu on 8/9. Pt failed extubation on 8/11 and re-intubated. Successful extubation to [facility] on 8/13. PMH significant for DM, HTN, prostate cancer.   OT comments  Limited progress this OT session. Patient received in supine and declined sitting on EOB but agreed to getting into chair position and performing OT. Patient limited participation due to lethargic and difficulty following direction. Patient participated in grooming and UB AAROM exercises due to difficulty staying on task. Patient will benefit from continued inpatient follow up therapy, <3 hours/day. Acute OT to continue to follow.       If plan is discharge home, recommend the following:  Two people to help with walking and/or transfers;Two people to help with bathing/dressing/bathroom;Assistance with cooking/housework;Direct supervision/assist for medications management;Direct supervision/assist for financial management;Assist for transportation;Help with stairs or ramp for entrance   Equipment Recommendations  Other (comment) (defer)    Recommendations for Other Services      Precautions / Restrictions Precautions Precautions: Fall Precaution Comments: HFNC; coretrack; flexiseal as of 8/16 Restrictions Weight Bearing Restrictions: No       Mobility Bed Mobility Overal bed mobility: Needs Assistance             General bed mobility comments: declined OOB     Transfers Overall transfer level: Needs assistance                 General transfer comment: declined OOB     Balance                                           ADL either performed or assessed with clinical judgement   ADL Overall ADL's : Needs assistance/impaired     Grooming: Wash/dry face;Moderate assistance;Bed level Grooming Details (indicate cue type and reason): attempted to perform with mod assist to complete                               General ADL Comments: total assist for self care    Extremity/Trunk Assessment              Vision       Perception     Praxis      Cognition Arousal: Lethargic Behavior During Therapy: Flat affect Overall Cognitive Status: Impaired/Different from baseline Area of Impairment: Memory, Following commands, Safety/judgement, Awareness, Problem solving                     Memory: Decreased short-term memory Following Commands: Follows one step commands inconsistently Safety/Judgement: Decreased awareness of safety Awareness: Intellectual Problem Solving: Slow processing, Decreased initiation, Difficulty sequencing, Requires verbal cues General Comments: difficulty following one step commands        Exercises Exercises: General Upper Extremity General Exercises - Upper Extremity Shoulder Flexion: AAROM, Both, 5 reps, Prone Shoulder ABduction: AAROM, Both, 5 reps, Supine  Elbow Flexion: AAROM, Both, 5 reps, Supine    Shoulder Instructions       General Comments BP 126/94 SpO2 92 on HFNC    Pertinent Vitals/ Pain       Pain Assessment Pain Assessment: Faces Faces Pain Scale: Hurts a little bit Pain Location: bottom Pain Descriptors / Indicators: Grimacing, Sore Pain Intervention(s): Limited activity within patient's tolerance, Monitored during session, Repositioned  Home Living                                          Prior  Functioning/Environment              Frequency  Min 2X/week        Progress Toward Goals  OT Goals(current goals can now be found in the care plan section)  Progress towards OT goals: Progressing toward goals  Acute Rehab OT Goals Patient Stated Goal: none stated OT Goal Formulation: With patient Time For Goal Achievement: 03/08/23 Potential to Achieve Goals: Good ADL Goals Pt Will Perform Grooming: with set-up;sitting Pt Will Transfer to Toilet: with mod assist;stand pivot transfer Additional ADL Goal #1: Pt will perform bed mobility with min A as a precursor for ADL Additional ADL Goal #2: Pt will follow all one step commands during ADL with increased time and no more than min cues.  Plan      Co-evaluation                 AM-PAC OT "6 Clicks" Daily Activity     Outcome Measure   Help from another person eating meals?: Total Help from another person taking care of personal grooming?: A Lot Help from another person toileting, which includes using toliet, bedpan, or urinal?: Total Help from another person bathing (including washing, rinsing, drying)?: Total Help from another person to put on and taking off regular upper body clothing?: Total Help from another person to put on and taking off regular lower body clothing?: Total 6 Click Score: 7    End of Session Equipment Utilized During Treatment: Oxygen  OT Visit Diagnosis: Unsteadiness on feet (R26.81);Muscle weakness (generalized) (M62.81);Other symptoms and signs involving cognitive function   Activity Tolerance Patient limited by lethargy   Patient Left in bed;with call bell/phone within reach;with bed alarm set   Nurse Communication Mobility status        Time: 2956-2130 OT Time Calculation (min): 15 min  Charges: OT General Charges $OT Visit: 1 Visit OT Treatments $Therapeutic Activity: 8-22 mins  Alfonse Flavors, OTA Acute Rehabilitation Services  Office [phone]   Dewain Penning 02/28/2023, 1:22 PM

## 2023-02-28 NOTE — Progress Notes (Signed)
This chaplain responded to PMT NP-Michelle consult for creating/updating the Pt. Advance Directive. The Pt. wife-Carolyn is at the bedside at the time of the visit. The Pt. is intermittent and limited in his responsiveness to the chaplain's presence and conversation.   The chaplain provided AD education with the Pt. wife. The Pt. wife-Carolyn is the Pt. surrogate decision maker. The chaplain will continue to pursue documentation of HCPOA. The Pt. wife desires to be present with the Pt. if/when documentation of HCPOA may occur.  The chaplain understands the Pt. wife is hopeful to meet with the SW.   This chaplain is available for follow up spiritual care as needed.  Chaplain Stephanie Acre [phone]

## 2023-02-28 NOTE — Plan of Care (Signed)
  Problem: Nutrition: Goal: Risk of aspiration will decrease Outcome: Progressing   

## 2023-02-28 NOTE — Care Management Important Message (Signed)
Important Message  Patient Details  Name: Xavier [name] MRN: [id number] Date of Birth: 10-13-1940   Medicare Important Message Given:  Yes     Elimelech Houseman 02/28/2023, 1:30 PM

## 2023-02-28 NOTE — Progress Notes (Signed)
DAILY PROGRESS NOTE   Patient Name: Xavier [name] Date of Encounter: 02/28/2023 Cardiologist: None  Chief Complaint   No complaints  Patient Profile   Xavier [name] is a 82 y.o. male with a hx of PAD, DM2, CKD, HLD, HTN, admitted 02/17/23 with CVA who is being seen 02/26/2023 for the evaluation of afib at the request of Dr Alanda Slim.   Subjective   Brief episode of PAF overnight- otherwise maintaining sinus rhythm. Plans to advance to dysphagia 2 diet today.   Objective   Vitals:   02/28/23 0912 02/28/23 0932 02/28/23 1033 02/28/23 1211  BP:    (!) 150/45  Pulse:    60  Resp:    20  Temp:    98.8 F (37.1 C)  TempSrc:    Axillary  SpO2: 92% 95% (!) 86% 95%  Weight:      Height:        Intake/Output Summary (Last 24 hours) at 02/28/2023 1308 Last data filed at 02/28/2023 1213 Gross per 24 hour  Intake 1363.6 ml  Output 1200 ml  Net 163.6 ml   Filed Weights   02/24/23 0500 02/25/23 0403 02/27/23 0500  Weight: 81.1 kg 79.4 kg 80.6 kg    Physical Exam   General appearance: alert and no distress Neck: no carotid bruit, no JVD, and thyroid not enlarged, symmetric, no tenderness/mass/nodules Lungs: diminished breath sounds bibasilar Heart: regular rate and rhythm Abdomen: soft, non-tender; bowel sounds normal; no masses,  no organomegaly Extremities: extremities normal, atraumatic, no cyanosis or edema Pulses: 2+ and symmetric Skin: Skin color, texture, turgor normal. No rashes or lesions Neurologic: Mental status: Awake, follows commands Psych: Pleasant  Inpatient Medications    Scheduled Meds:  acetaminophen  650 mg Per Tube Q6H   Or   acetaminophen  650 mg Rectal Q6H   amLODipine  5 mg Per Tube Daily   apixaban  2.5 mg Per Tube BID   atorvastatin  80 mg Per Tube Daily   Chlorhexidine Gluconate Cloth  6 each Topical Q0600   docusate  100 mg Per Tube BID   doxazosin  2 mg Per Tube Daily   free water  300 mL Per Tube Q4H   insulin aspart  0-20 Units  Subcutaneous Q4H   insulin aspart  4 Units Subcutaneous Q4H   insulin glargine-yfgn  20 Units Subcutaneous Daily   metoCLOPramide (REGLAN) injection  5 mg Intravenous Q8H   mouth rinse  15 mL Mouth Rinse 4 times per day   QUEtiapine  25 mg Per Tube QHS   ticagrelor  90 mg Per Tube BID    Continuous Infusions:  sodium chloride Stopped (02/20/23 2337)   amiodarone 30 mg/hr (02/28/23 0547)   feeding supplement (GLUCERNA 1.5 CAL) 45 mL/hr at 02/28/23 0200    PRN Meds: iohexol, ipratropium-albuterol, nitroGLYCERIN, ondansetron (ZOFRAN) IV, mouth rinse, polyethylene glycol, senna-docusate   Labs   Results for orders placed or performed during the hospital encounter of 02/17/23 (from the past 48 hour(s))  Renal function panel     Status: Abnormal   Collection Time: 02/26/23  2:55 PM  Result Value Ref Range   Sodium 148 (H) 135 - 145 mmol/L   Potassium 3.4 (L) 3.5 - 5.1 mmol/L   Chloride 115 (H) 98 - 111 mmol/L   CO2 22 22 - 32 mmol/L   Glucose, Bld 255 (H) 70 - 99 mg/dL    Comment: Glucose reference range applies only to samples taken after fasting for  at least 8 hours.   BUN 105 (H) 8 - 23 mg/dL   Creatinine, Ser 6.04 (H) 0.61 - 1.24 mg/dL   Calcium 8.2 (L) 8.9 - 10.3 mg/dL   Phosphorus 5.0 (H) 2.5 - 4.6 mg/dL   Albumin 2.2 (L) 3.5 - 5.0 g/dL   GFR, Estimated 24 (L) >60 mL/min    Comment: (NOTE) Calculated using the CKD-EPI Creatinine Equation (2021)    Anion gap 11 5 - 15    Comment: Performed at [facility], [address] [place], [place] [address]  Glucose, capillary     Status: Abnormal   Collection Time: 02/26/23  3:55 PM  Result Value Ref Range   Glucose-Capillary 250 (H) 70 - 99 mg/dL    Comment: Glucose reference range applies only to samples taken after fasting for at least 8 hours.  Glucose, capillary     Status: Abnormal   Collection Time: 02/26/23  8:02 PM  Result Value Ref Range   Glucose-Capillary 226 (H) 70 - 99 mg/dL    Comment: Glucose reference  range applies only to samples taken after fasting for at least 8 hours.  Glucose, capillary     Status: Abnormal   Collection Time: 02/26/23 11:55 PM  Result Value Ref Range   Glucose-Capillary 197 (H) 70 - 99 mg/dL    Comment: Glucose reference range applies only to samples taken after fasting for at least 8 hours.  Renal function panel     Status: Abnormal   Collection Time: 02/27/23  2:07 AM  Result Value Ref Range   Sodium 148 (H) 135 - 145 mmol/L   Potassium 3.7 3.5 - 5.1 mmol/L   Chloride 114 (H) 98 - 111 mmol/L   CO2 22 22 - 32 mmol/L   Glucose, Bld 185 (H) 70 - 99 mg/dL    Comment: Glucose reference range applies only to samples taken after fasting for at least 8 hours.   BUN 106 (H) 8 - 23 mg/dL   Creatinine, Ser 1.19 (H) 0.61 - 1.24 mg/dL   Calcium 8.2 (L) 8.9 - 10.3 mg/dL   Phosphorus 4.7 (H) 2.5 - 4.6 mg/dL   Albumin 2.3 (L) 3.5 - 5.0 g/dL   GFR, Estimated 25 (L) >60 mL/min    Comment: (NOTE) Calculated using the CKD-EPI Creatinine Equation (2021)    Anion gap 12 5 - 15    Comment: Performed at [facility], [address] [place], [place] [address]  CBC with Differential/Platelet     Status: Abnormal   Collection Time: 02/27/23  2:07 AM  Result Value Ref Range   WBC 19.0 (H) 4.0 - 10.5 K/uL   RBC 3.21 (L) 4.22 - 5.81 MIL/uL   Hemoglobin 8.2 (L) 13.0 - 17.0 g/dL   HCT 95.6 (L) 21.3 - 08.6 %   MCV 86.6 80.0 - 100.0 fL   MCH 25.5 (L) 26.0 - 34.0 pg   MCHC 29.5 (L) 30.0 - 36.0 g/dL   RDW 57.8 46.9 - 62.9 %   Platelets 286 150 - 400 K/uL   nRBC 0.0 0.0 - 0.2 %   Neutrophils Relative % 89 %   Neutro Abs 17.1 (H) 1.7 - 7.7 K/uL   Lymphocytes Relative 6 %   Lymphs Abs 1.1 0.7 - 4.0 K/uL   Monocytes Relative 3 %   Monocytes Absolute 0.5 0.1 - 1.0 K/uL   Eosinophils Relative 1 %   Eosinophils Absolute 0.1 0.0 - 0.5 K/uL   Basophils Relative 0 %  Basophils Absolute 0.0 0.0 - 0.1 K/uL   Immature Granulocytes 1 %   Abs Immature Granulocytes 0.15 (H) 0.00 -  0.07 K/uL    Comment: Performed at [facility], [address], [place], [place] [address]  Magnesium     Status: Abnormal   Collection Time: 02/27/23  2:07 AM  Result Value Ref Range   Magnesium 3.4 (H) 1.7 - 2.4 mg/dL    Comment: Performed at [facility], [address] [place], [place] [address]  Glucose, capillary     Status: Abnormal   Collection Time: 02/27/23  3:36 AM  Result Value Ref Range   Glucose-Capillary 156 (H) 70 - 99 mg/dL    Comment: Glucose reference range applies only to samples taken after fasting for at least 8 hours.  Glucose, capillary     Status: Abnormal   Collection Time: 02/27/23  8:03 AM  Result Value Ref Range   Glucose-Capillary 185 (H) 70 - 99 mg/dL    Comment: Glucose reference range applies only to samples taken after fasting for at least 8 hours.  Glucose, capillary     Status: Abnormal   Collection Time: 02/27/23 11:44 AM  Result Value Ref Range   Glucose-Capillary 268 (H) 70 - 99 mg/dL    Comment: Glucose reference range applies only to samples taken after fasting for at least 8 hours.  Glucose, capillary     Status: Abnormal   Collection Time: 02/27/23  3:25 PM  Result Value Ref Range   Glucose-Capillary 282 (H) 70 - 99 mg/dL    Comment: Glucose reference range applies only to samples taken after fasting for at least 8 hours.  Glucose, capillary     Status: Abnormal   Collection Time: 02/27/23  7:47 PM  Result Value Ref Range   Glucose-Capillary 174 (H) 70 - 99 mg/dL    Comment: Glucose reference range applies only to samples taken after fasting for at least 8 hours.  Renal function panel     Status: Abnormal   Collection Time: 02/27/23 11:35 PM  Result Value Ref Range   Sodium 146 (H) 135 - 145 mmol/L   Potassium 3.9 3.5 - 5.1 mmol/L   Chloride 112 (H) 98 - 111 mmol/L   CO2 24 22 - 32 mmol/L   Glucose, Bld 159 (H) 70 - 99 mg/dL    Comment: Glucose reference range applies only to samples taken after fasting for at least 8  hours.   BUN 90 (H) 8 - 23 mg/dL   Creatinine, Ser 1.30 (H) 0.61 - 1.24 mg/dL   Calcium 8.2 (L) 8.9 - 10.3 mg/dL   Phosphorus 4.3 2.5 - 4.6 mg/dL   Albumin 2.2 (L) 3.5 - 5.0 g/dL   GFR, Estimated 27 (L) >60 mL/min    Comment: (NOTE) Calculated using the CKD-EPI Creatinine Equation (2021)    Anion gap 10 5 - 15    Comment: Performed at [facility], [address], [place], [place] [address]  CBC     Status: Abnormal   Collection Time: 02/27/23 11:35 PM  Result Value Ref Range   WBC 17.8 (H) 4.0 - 10.5 K/uL   RBC 3.18 (L) 4.22 - 5.81 MIL/uL   Hemoglobin 8.2 (L) 13.0 - 17.0 g/dL   HCT 46.9 (L) 62.9 - 52.8 %   MCV 86.5 80.0 - 100.0 fL   MCH 25.8 (L) 26.0 - 34.0 pg   MCHC 29.8 (L) 30.0 - 36.0 g/dL   RDW  14.6 11.5 - 15.5 %   Platelets 322 150 - 400 K/uL   nRBC 0.0 0.0 - 0.2 %    Comment: Performed at [facility], [address], [place], [place] [address]  Magnesium     Status: Abnormal   Collection Time: 02/27/23 11:35 PM  Result Value Ref Range   Magnesium 3.3 (H) 1.7 - 2.4 mg/dL    Comment: Performed at [facility], [address], [place], [place] [address]  Glucose, capillary     Status: Abnormal   Collection Time: 02/27/23 11:55 PM  Result Value Ref Range   Glucose-Capillary 145 (H) 70 - 99 mg/dL    Comment: Glucose reference range applies only to samples taken after fasting for at least 8 hours.  Glucose, capillary     Status: Abnormal   Collection Time: 02/28/23  3:30 AM  Result Value Ref Range   Glucose-Capillary 147 (H) 70 - 99 mg/dL    Comment: Glucose reference range applies only to samples taken after fasting for at least 8 hours.   Comment 1 Notify RN   Glucose, capillary     Status: Abnormal   Collection Time: 02/28/23  9:00 AM  Result Value Ref Range   Glucose-Capillary 208 (H) 70 - 99 mg/dL    Comment: Glucose reference range applies only to samples taken after fasting for at least 8 hours.  Glucose, capillary     Status: Abnormal    Collection Time: 02/28/23 12:08 PM  Result Value Ref Range   Glucose-Capillary 238 (H) 70 - 99 mg/dL    Comment: Glucose reference range applies only to samples taken after fasting for at least 8 hours.    ECG   N/A  Telemetry   Sinus rhythm- Personally Reviewed  Radiology    No results found.  Cardiac Studies   ECHOCARDIOGRAM REPORT       Patient Name:   Xavier [name] Date of Exam: 02/18/2023  Medical Rec #:  [id number]        Height:       74.0 in  Accession #:    [id number]       Weight:       186.1 lb  Date of Birth:  Oct 18, 1940        BSA:          2.108 m  Patient Age:    81 years         BP:           148/53 mmHg  Patient Gender: M                HR:           54 bpm.  Exam Location:  Inpatient   Procedure: 2D Echo, Color Doppler and Cardiac Doppler   Indications:    stroke    History:        Patient has prior history of Echocardiogram examinations,  most                 recent 04/30/2020. Chronic kidney disease,                  Signs/Symptoms:Murmur; Risk Factors:Hypertension,  Dyslipidemia                 and Diabetes.    Sonographer:    Delcie Roch RDCS  Referring Phys: [id number] Reyne Dumas [facility]     Sonographer Comments: Technically difficult study due to poor echo  windows  and echo performed with patient supine and on artificial respirator. Image  acquisition challenging due to respiratory motion.  IMPRESSIONS     1. Left ventricular ejection fraction, by estimation, is 55 to 60%. The  left ventricle has normal function. The left ventricle has no regional  wall motion abnormalities. There is moderate concentric left ventricular  hypertrophy. Left ventricular  diastolic parameters are consistent with Grade II diastolic dysfunction  (pseudonormalization).   2. Right ventricular systolic function is normal. The right ventricular  size is not well visualized. There is normal pulmonary artery systolic  pressure.   3. A small pericardial  effusion is present. The pericardial effusion is  anterior to the right ventricle. There is no evidence of cardiac  tamponade.   4. The mitral valve is degenerative. No evidence of mitral valve  regurgitation.   5. The aortic valve is tricuspid. There is moderate calcification of the  aortic valve. Aortic valve regurgitation is not visualized. Aortic valve  sclerosis is present, with no evidence of aortic valve stenosis.   6. The inferior vena cava is dilated in size with >50% respiratory  variability, suggesting right atrial pressure of 8 mmHg.   Comparison(s): No significant change from prior study. Prior images  reviewed side by side.    Assessment   Principal Problem:   Acute ischemic stroke [facility]) Active Problems:   Type II diabetes mellitus with renal manifestations (HCC)   Acute renal failure superimposed on stage 3 chronic kidney disease (HCC)   Chronic kidney disease due to hypertension   Acute respiratory failure with hypoxia (HCC)   Pneumonia due to COVID-19 virus   Aspiration pneumonia of both lungs due to gastric secretions (HCC)   Influenza   Paroxysmal atrial fibrillation (HCC)   Dysphagia   Atrial fibrillation with rapid ventricular response [facility])   Plan   Xavier [name] had a brief episode of afib overnight, but maintaining sinus rhythm. Continue IV amiodarone for now until reliably taking po - then can transition to oral amiodarone 200 mg BID. Monitor volume status while off lasix- he is recorded 1L positive yesterday.  Time Spent Directly with Patient:  I have spent a total of 25 minutes with the patient reviewing hospital notes, telemetry, EKGs, labs and examining the patient as well as establishing an assessment and plan that was discussed personally with the patient.  > 50% of time was spent in direct patient care.  Length of Stay:  LOS: 11 days   Chrystie Nose, MD, [facility], FACP  [place]  [facility]  Medical Director of the Advanced Lipid  Disorders &  Cardiovascular Risk Reduction Clinic Diplomate of the American Board of Clinical Lipidology Attending Cardiologist  Direct Dial: [phone]  Fax: [phone]  Website:  [email or web address]  Lisette Abu Ammaar [place] 02/28/2023, 1:08 PM

## 2023-02-28 NOTE — Progress Notes (Signed)
PROGRESS NOTE  Xavier [name] ZOX:[id number] DOB: 14-Mar-1941   PCP: [name], MD  Patient is from: Home.  DOA: 02/17/2023 LOS: 11  Chief complaints Chief Complaint  Patient presents with   Code Stroke     Brief Narrative / Interim history: 82 year old M with PMH of DM-2, CKD-3B and HTN presented after acute onset left-sided weakness, facial droop and aphasia.  Code stroke activated in ED.  CT head with right MCA and PCA infarcts.  CT angio head and neck with occlusion of right MCA and PCA segments.  Received TNK and he was intubated for airway protection. underwent revascularization and stenting of occluded right MCA and revascularization of occluded right PCA without stenting. Notably, he was diagnosed with COVID and flu on same day as strokelike symptoms occurred.   Hospital course complicated by acute hypoxic respiratory failure in the setting of aspiration pneumonia, paroxysmal A-fib with RVR, delirium, AKI and urinary retention.  Respiratory failure, A-fib and AKI improved.  Nephrology signed off.  Eventually, he was extubated on 8/13 and transferred to hospitalist service on 8/17 while on 23 L / 80% by HFNC.   Patient continues to require significant oxygen up to 25 L by HFNC.  In and out of A-fib with RVR and bradycardia.  Cardiology consulted.  Started on amiodarone drip.  Dysphagia improved and has been upgraded to dysphagia 2 diet.  Insurance denied LTAC.  Attempted to call patient's insurance at [phone] for peer to peer review but no answer. Left voicemail with patient's information, my name and my callback number on 8/19.    Subjective: Seen and examined earlier this morning.  No major events overnight of this morning.  No complaints but not a great historian.  He is awake and alert but only oriented to self.  He thinks he is in "medical boat".  Flailing his left arm without purpose.  Has mittens bilaterally.  Responds no to pain or shortness of breath.  Improved  oxygen requirement.  Saturating in upper 90s on 7L  Objective: Vitals:   02/28/23 1211 02/28/23 1300 02/28/23 1315 02/28/23 1350  BP: (!) 150/45  94/70 (!) 151/45  Pulse: 60     Resp: 20  20 20   Temp: 98.8 F (37.1 C)     TempSrc: Axillary     SpO2: 95% 91%  92%  Weight:      Height:        Examination:  GENERAL: No apparent distress.  Nontoxic. HEENT: MMM.  Vision and hearing grossly intact.  NECK: Supple.  No apparent JVD.  RESP:  No IWOB.  Fair aeration bilaterally. CVS: Bradycardic to 56.  Regular rhythm.  Heart sounds normal.  ABD/GI/GU: BS+. Abd soft, NTND.  Foley and rectal tube in place. MSK/EXT:  Moves extremities. No apparent deformity. No edema.  Mittens bilaterally. SKIN: no apparent skin lesion or wound NEURO: Wake and alert.  Oriented to self.  Follows commands.  Moves all extremities.  Flailing his left arm PSYCH: A little restless but no agitation.  Procedures:  8/9-TNK, thrombectomy and intubation for airway protection.  Antibiotic started for aspiration 8/11-extubated but reintubated due to respiratory failure. 8/13-extubated.  Microbiology summarized: COVID-19 PCR nonreactive MRSA PCR nonreactive Full RVP nonreactive Respiratory culture with normal flora Blood cultures NGTD  Assessment and plan: Principal Problem:   Acute ischemic stroke [facility]) Active Problems:   Type II diabetes mellitus with renal manifestations (HCC)   Acute renal failure superimposed on stage 3 chronic kidney disease (HCC)  Chronic kidney disease due to hypertension   Acute respiratory failure with hypoxia (HCC)   Pneumonia due to COVID-19 virus   Aspiration pneumonia of both lungs due to gastric secretions (HCC)   Influenza   Paroxysmal atrial fibrillation (HCC)   Dysphagia   Atrial fibrillation with rapid ventricular response (HCC)  Acute right MCA and PCA infarcts s/p TNK and revascularization with stent in MCA M1 segment and revascularization of occluded right PCA  without stenting.  Moves all extremities equally today. -Continue Brilinta, Eliquis and Lipitor. -Aggressive risk reduction.  Goal SBP <160. -PT/OT   Acute metabolic encephalopathy/delirium: Improving.  No agitation but restless.. -Reorientation and delirium precaution -Minimize sedating medications   Paroxysmal atrial fibrillation with RVR: Intermittent RVR with bradycardia.  Cardiology consulted.  Started on amiodarone drip.  Now sinus rhythm with intermittent bradycardia. -Transition to p.o. amiodarone 200 mg twice daily by cardiology -Continue Eliquis -Optimize electrolytes   Acute hypoxic respiratory failure, improving.  No history of COPD or asthma.  Now on 10 L by [place]. Aspiration pneumonia versus pneumonitis: COVID, full RVP and respiratory culture unrevealing. -S/p intubation and extubation. -Completed 7 days of Zosyn on 8/17. -Wean oxygen as able.  Minimum oxygen to keep saturation above 88%. -Continue DuoNeb as needed -Pulmonary toilet  Dysphagia: Upgraded to dysphagia 2 diet.  Tube feed at goal. -Follow further SLP recommendations -May need G-tube if p.o. intake does not improve and he continues to require tube feed. -Aspiration precautions  Urinary retention: Was getting around 600-700 UOP with I/O cath in ICU.  Foley discontinued on 8/19. -Increased doxazosin to 2 mg daily on 8/19. -Bladder scan every 6 hrs for 2 to 3 days.   AKI on CKD-3B: Not oliguric.  Multifactorial including contrast, Zosyn and urinary tension: Renal US without acute finding.  Started to improve. Recent Labs    02/21/23 1001 02/21/23 1754 02/22/23 0615 02/23/23 0051 02/24/23 2956 02/25/23 0229 02/26/23 0209 02/26/23 1455 02/27/23 0207 02/27/23 2335  BUN 92* 91* 96* 97* 86* 87* 102* 105* 106* 90*  CREATININE 3.86* 3.58* 3.38* 3.30* 2.46* 2.74* 2.87* 2.63* 2.54* 2.32*  -Avoid nephrotoxic meds. -Continue monitoring -Nephrology signed off  Uncontrolled IDDM-2 with hypoglycemia,  hyperglycemia and hyperlipidemia: A1c 9.0%.  Seems like he is on [place] at home.  Hypoglycemia in the setting of decreased tube feed. Recent Labs  Lab 02/27/23 2355 02/28/23 0330 02/28/23 0900 02/28/23 1208 02/28/23 1349  GLUCAP 145* 147* 208* 238* 232*  -Continue resistant scale every 4 hours -Increase Semglee to 25 units daily -Increase tube feed coverage from 4 to 6 units every 4 hours -Continue Lipitor -Further adjustment as appropriate.  Normocytic anemia: Slight drop in Hgb today.  No obvious bleeding. Recent Labs    02/20/23 0029 02/20/23 0032 02/21/23 0045 02/22/23 0615 02/23/23 0051 02/24/23 2130 02/25/23 0229 02/26/23 0209 02/27/23 0207 02/27/23 2335  HGB 8.8* 9.5* 8.2* 9.7* 9.0* 9.6* 9.2* 9.1* 8.2* 8.2*  -Continue monitoring  Labile blood pressure/hypertension: BP slightly elevated today -Continue amlodipine and Cardura -Continue monitoring.  Nausea with vomiting: Seems to have resolved. -Continue Reglan every 6 hours   Hypernatremia: Na 154>> 153> > 146. -Continue free water to 300 cc every 4 hours -Recheck in the morning.   Hematuria: Resolved.   Diarrhea?  Still with rectal tube -Discontinue if no further diarrhea   Physical deconditioning/disposition:  -PT/OT  Leukocytosis: Unclear source of this for this.  Just finished IV Zosyn on 8/17.  Improving off antibiotics. -Continue monitoring  Nutrition/inadequate oral  intake/dysphagia: Upgraded to dysphagia 2 diet. Body mass index is 22.81 kg/m. Nutrition Problem: Inadequate oral intake Etiology: acute illness Signs/Symptoms: NPO status Interventions: Refer to RD note for recommendations  Pressure skin injury: Not POA.  Pressure Injury 02/26/23 Buttocks Right;Left Stage 2 -  Partial thickness loss of dermis presenting as a shallow open injury with a red, pink wound bed without slough. (Active)  02/26/23 1600  Location: Buttocks  Location Orientation: Right;Left  Staging: Stage 2  -  Partial thickness loss of dermis presenting as a shallow open injury with a red, pink wound bed without slough.  Wound Description (Comments):   Present on Admission:   Dressing Type Foam - Lift dressing to assess site every shift 02/28/23 0336   DVT prophylaxis:  apixaban (ELIQUIS) tablet 2.5 mg Start: 02/23/23 1030 Place and maintain sequential compression device Start: 02/17/23 2140 apixaban (ELIQUIS) tablet 2.5 mg  Code Status: Full code Family Communication: None at bedside today. Level of care: Progressive Status is: Inpatient Remains inpatient appropriate because: Acute respiratory failure with significant oxygen requirement, A-fib with RVR, dysphagia and acute CVA   Final disposition: Insurance declined LTAC.  Called for peer to peer review on 8/19 but no answer.  He likely goes to SNF if oxygen requirement improves.  May need reliable means of feeding if p.o. intake does not improve Consultants:  Neurology Nephrology Pulmonology Cardiology  55 minutes with more than 50% spent in reviewing records, counseling patient/family and coordinating care.   Sch Meds:  Scheduled Meds:  acetaminophen  650 mg Per Tube Q6H   Or   acetaminophen  650 mg Rectal Q6H   amLODipine  5 mg Per Tube Daily   apixaban  2.5 mg Per Tube BID   atorvastatin  80 mg Per Tube Daily   Chlorhexidine Gluconate Cloth  6 each Topical Q0600   docusate  100 mg Per Tube BID   doxazosin  2 mg Per Tube Daily   free water  300 mL Per Tube Q4H   insulin aspart  0-20 Units Subcutaneous Q4H   insulin aspart  4 Units Subcutaneous Q4H   insulin glargine-yfgn  20 Units Subcutaneous Daily   metoCLOPramide (REGLAN) injection  5 mg Intravenous Q8H   mouth rinse  15 mL Mouth Rinse 4 times per day   QUEtiapine  25 mg Per Tube QHS   ticagrelor  90 mg Per Tube BID   Continuous Infusions:  sodium chloride Stopped (02/20/23 2337)   amiodarone 30 mg/hr (02/28/23 1355)   feeding supplement (GLUCERNA 1.5 CAL) 55  mL/hr at 02/28/23 0800   PRN Meds:.iohexol, ipratropium-albuterol, nitroGLYCERIN, ondansetron (ZOFRAN) IV, mouth rinse, polyethylene glycol, senna-docusate  Antimicrobials: Anti-infectives (From admission, onward)    Start     Dose/Rate Route Frequency Ordered Stop   02/24/23 2000  piperacillin-tazobactam (ZOSYN) IVPB 3.375 g        3.375 g 12.5 mL/hr over 240 Minutes Intravenous Every 8 hours 02/24/23 1305 02/25/23 1807   02/20/23 1000  piperacillin-tazobactam (ZOSYN) IVPB 2.25 g  Status:  Discontinued        2.25 g 100 mL/hr over 30 Minutes Intravenous Every 6 hours 02/20/23 0900 02/24/23 1305   02/19/23 2200  oseltamivir (TAMIFLU) capsule 30 mg  Status:  Discontinued        30 mg Per Tube Daily 02/19/23 0928 02/20/23 0908   02/18/23 1000  remdesivir 100 mg in sodium chloride 0.9 % 100 mL IVPB       Placed in "Followed by" Linked  Group   100 mg 200 mL/hr over 30 Minutes Intravenous Daily 02/17/23 2123 02/19/23 1004   02/17/23 2330  remdesivir 200 mg in sodium chloride 0.9% 250 mL IVPB       Placed in "Followed by" Linked Group   200 mg 580 mL/hr over 30 Minutes Intravenous Once 02/17/23 2123 02/18/23 0031   02/17/23 2215  oseltamivir (TAMIFLU) capsule 75 mg  Status:  Discontinued        75 mg Per Tube 2 times daily 02/17/23 2120 02/17/23 2124   02/17/23 2215  oseltamivir (TAMIFLU) capsule 30 mg  Status:  Discontinued        30 mg Per Tube 2 times daily 02/17/23 2124 02/19/23 0928   02/17/23 2200  cefTRIAXone (ROCEPHIN) 2 g in sodium chloride 0.9 % 100 mL IVPB  Status:  Discontinued        2 g 200 mL/hr over 30 Minutes Intravenous Every 24 hours 02/17/23 2050 02/20/23 0900   02/17/23 2200  azithromycin (ZITHROMAX) 500 mg in sodium chloride 0.9 % 250 mL IVPB  Status:  Discontinued        500 mg 250 mL/hr over 60 Minutes Intravenous Every 24 hours 02/17/23 2050 02/21/23 1033        I have personally reviewed the following labs and images: CBC: Recent Labs  Lab 02/24/23 0635  02/25/23 0229 02/26/23 0209 02/27/23 0207 02/27/23 2335  WBC 13.0* 18.3* 22.0* 19.0* 17.8*  NEUTROABS  --   --   --  17.1*  --   HGB 9.6* 9.2* 9.1* 8.2* 8.2*  HCT 30.8* 30.4* 30.0* 27.8* 27.5*  MCV 83.0 85.4 87.0 86.6 86.5  PLT 226 248 280 286 322   BMP &GFR Recent Labs  Lab 02/24/23 0635 02/25/23 0229 02/25/23 2157 02/26/23 0209 02/26/23 1455 02/27/23 0207 02/27/23 2335  NA 149* 154*  --  153* 148* 148* 146*  K 3.4* 3.7  --  3.6 3.4* 3.7 3.9  CL 110 116*  --  116* 115* 114* 112*  CO2 25 24  --  24 22 22 24   GLUCOSE 186* 166*  --  96 255* 185* 159*  BUN 86* 87*  --  102* 105* 106* 90*  CREATININE 2.46* 2.74*  --  2.87* 2.63* 2.54* 2.32*  CALCIUM 8.9 8.9  --  8.6* 8.2* 8.2* 8.2*  MG 3.3*  --  3.6* 3.5*  --  3.4* 3.3*  PHOS 3.6 3.8  --  4.4 5.0* 4.7* 4.3   Estimated Creatinine Clearance: 28 mL/min (A) (by C-G formula based on SCr of 2.32 mg/dL (H)). Liver & Pancreas: Recent Labs  Lab 02/25/23 0229 02/26/23 0209 02/26/23 1455 02/27/23 0207 02/27/23 2335  AST  --  37  --   --   --   ALT  --  51*  --   --   --   ALKPHOS  --  75  --   --   --   BILITOT  --  0.6  --   --   --   PROT  --  6.9  --   --   --   ALBUMIN 2.8* 2.6* 2.2* 2.3* 2.2*   No results for input(s): "LIPASE", "AMYLASE" in the last 168 hours. No results for input(s): "AMMONIA" in the last 168 hours. Diabetic: No results for input(s): "HGBA1C" in the last 72 hours. Recent Labs  Lab 02/27/23 2355 02/28/23 0330 02/28/23 0900 02/28/23 1208 02/28/23 1349  GLUCAP 145* 147* 208* 238* 232*   Cardiac Enzymes:  No results for input(s): "CKTOTAL", "CKMB", "CKMBINDEX", "TROPONINI" in the last 168 hours. No results for input(s): "PROBNP" in the last 8760 hours. Coagulation Profile: No results for input(s): "INR", "PROTIME" in the last 168 hours. Thyroid Function Tests: Recent Labs    02/26/23 0209  TSH 4.786*  FREET4 0.83  T3FREE 1.6*   Lipid Profile: No results for input(s): "CHOL", "HDL",  "LDLCALC", "TRIG", "CHOLHDL", "LDLDIRECT" in the last 72 hours.  Anemia Panel: No results for input(s): "VITAMINB12", "FOLATE", "FERRITIN", "TIBC", "IRON", "RETICCTPCT" in the last 72 hours. Urine analysis:    Component Value Date/Time   COLORURINE YELLOW 02/18/2023 0137   APPEARANCEUR CLEAR 02/18/2023 0137   LABSPEC >1.046 (H) 02/18/2023 0137   PHURINE 5.0 02/18/2023 0137   GLUCOSEU >=500 (A) 02/18/2023 0137   HGBUR NEGATIVE 02/18/2023 0137   BILIRUBINUR NEGATIVE 02/18/2023 0137   KETONESUR NEGATIVE 02/18/2023 0137   PROTEINUR 30 (A) 02/18/2023 0137   UROBILINOGEN 1.0 09/28/2014 1645   NITRITE NEGATIVE 02/18/2023 0137   LEUKOCYTESUR NEGATIVE 02/18/2023 0137   Sepsis Labs: Invalid input(s): "PROCALCITONIN", "LACTICIDVEN"  Microbiology: Recent Results (from the past 240 hour(s))  Culture, Respiratory w Gram Stain     Status: None   Collection Time: 02/20/23  9:21 AM   Specimen: Tracheal Aspirate; Respiratory  Result Value Ref Range Status   Specimen Description TRACHEAL ASPIRATE  Final   Special Requests NONE  Final   Gram Stain NO WBC SEEN NO ORGANISMS SEEN   Final   Culture   Final    RARE Normal respiratory flora-no Staph aureus or Pseudomonas seen Performed at [facility], [address], [place], [place] [address]    Report Status 02/22/2023 FINAL  Final  SARS Coronavirus 2 by RT PCR (hospital order, performed in [facility] hospital lab) *cepheid single result test* Anterior Nasal Swab     Status: None   Collection Time: 02/20/23 10:35 AM   Specimen: Anterior Nasal Swab  Result Value Ref Range Status   SARS Coronavirus 2 by RT PCR NEGATIVE NEGATIVE Final    Comment: Performed at [facility], [address], [place], [place] [address]  Respiratory (~20 pathogens) panel by PCR     Status: None   Collection Time: 02/20/23 10:35 AM   Specimen: Nasopharyngeal Swab; Respiratory  Result Value Ref Range Status   Adenovirus NOT DETECTED NOT DETECTED Final    Coronavirus 229E NOT DETECTED NOT DETECTED Final    Comment: (NOTE) The Coronavirus on the Respiratory Panel, DOES NOT test for the novel  Coronavirus (2019 nCoV)    Coronavirus HKU1 NOT DETECTED NOT DETECTED Final   Coronavirus NL63 NOT DETECTED NOT DETECTED Final   Coronavirus OC43 NOT DETECTED NOT DETECTED Final   Metapneumovirus NOT DETECTED NOT DETECTED Final   Rhinovirus / Enterovirus NOT DETECTED NOT DETECTED Final   Influenza A NOT DETECTED NOT DETECTED Final   Influenza B NOT DETECTED NOT DETECTED Final   Parainfluenza Virus 1 NOT DETECTED NOT DETECTED Final   Parainfluenza Virus 2 NOT DETECTED NOT DETECTED Final   Parainfluenza Virus 3 NOT DETECTED NOT DETECTED Final   Parainfluenza Virus 4 NOT DETECTED NOT DETECTED Final   Respiratory Syncytial Virus NOT DETECTED NOT DETECTED Final   Bordetella pertussis NOT DETECTED NOT DETECTED Final   Bordetella Parapertussis NOT DETECTED NOT DETECTED Final   Chlamydophila pneumoniae NOT DETECTED NOT DETECTED Final   Mycoplasma pneumoniae NOT DETECTED NOT DETECTED Final    Comment: Performed at [facility], [address] [place], [place] [address]    Radiology Studies: No results found.    Corinna Burkman T. Aundray Cartlidge [facility]  If 7PM-7AM, please contact night-coverage [email or web address] 02/28/2023, 2:43 PM

## 2023-03-01 ENCOUNTER — Inpatient Hospital Stay (HOSPITAL_COMMUNITY): Payer: PPO

## 2023-03-01 DIAGNOSIS — R5381 Other malaise: Secondary | ICD-10-CM

## 2023-03-01 DIAGNOSIS — J9601 Acute respiratory failure with hypoxia: Secondary | ICD-10-CM | POA: Diagnosis not present

## 2023-03-01 DIAGNOSIS — I4891 Unspecified atrial fibrillation: Secondary | ICD-10-CM | POA: Diagnosis not present

## 2023-03-01 DIAGNOSIS — L8990 Pressure ulcer of unspecified site, unspecified stage: Secondary | ICD-10-CM | POA: Insufficient documentation

## 2023-03-01 DIAGNOSIS — I639 Cerebral infarction, unspecified: Secondary | ICD-10-CM | POA: Diagnosis not present

## 2023-03-01 LAB — CBC
HCT: 26.8 % — ABNORMAL LOW (ref 39.0–52.0)
HCT: 26.9 % — ABNORMAL LOW (ref 39.0–52.0)
Hemoglobin: 8.1 g/dL — ABNORMAL LOW (ref 13.0–17.0)
Hemoglobin: 8 g/dL — ABNORMAL LOW (ref 13.0–17.0)
MCH: 25.9 pg — ABNORMAL LOW (ref 26.0–34.0)
MCH: 26.2 pg (ref 26.0–34.0)
MCHC: 30.2 g/dL (ref 30.0–36.0)
MCHC: 29.7 g/dL — ABNORMAL LOW (ref 30.0–36.0)
MCV: 85.6 fL (ref 80.0–100.0)
MCV: 88.2 fL (ref 80.0–100.0)
Platelets: 360 10*3/uL (ref 150–400)
Platelets: 343 10*3/uL (ref 150–400)
RBC: 3.13 MIL/uL — ABNORMAL LOW (ref 4.22–5.81)
RBC: 3.05 MIL/uL — ABNORMAL LOW (ref 4.22–5.81)
RDW: 14.5 % (ref 11.5–15.5)
RDW: 14.5 % (ref 11.5–15.5)
WBC: 15.5 10*3/uL — ABNORMAL HIGH (ref 4.0–10.5)
WBC: 14.8 10*3/uL — ABNORMAL HIGH (ref 4.0–10.5)
nRBC: 0 % (ref 0.0–0.2)
nRBC: 0 % (ref 0.0–0.2)

## 2023-03-01 LAB — POCT I-STAT 7, (LYTES, BLD GAS, ICA,H+H)
Acid-base deficit: 3 mmol/L — ABNORMAL HIGH (ref 0.0–2.0)
Bicarbonate: 21.4 mmol/L (ref 20.0–28.0)
Calcium, Ion: 1.15 mmol/L (ref 1.15–1.40)
HCT: 22 % — ABNORMAL LOW (ref 39.0–52.0)
Hemoglobin: 7.5 g/dL — ABNORMAL LOW (ref 13.0–17.0)
O2 Saturation: 98 %
Patient temperature: 96.5
Potassium: 4.2 mmol/L (ref 3.5–5.1)
Sodium: 146 mmol/L — ABNORMAL HIGH (ref 135–145)
TCO2: 22 mmol/L (ref 22–32)
pCO2 arterial: 30.7 mmHg — ABNORMAL LOW (ref 32–48)
pH, Arterial: 7.448 (ref 7.35–7.45)
pO2, Arterial: 96 mmHg (ref 83–108)

## 2023-03-01 LAB — GLUCOSE, CAPILLARY
Glucose-Capillary: 169 mg/dL — ABNORMAL HIGH (ref 70–99)
Glucose-Capillary: 224 mg/dL — ABNORMAL HIGH (ref 70–99)
Glucose-Capillary: 309 mg/dL — ABNORMAL HIGH (ref 70–99)
Glucose-Capillary: 289 mg/dL — ABNORMAL HIGH (ref 70–99)
Glucose-Capillary: 165 mg/dL — ABNORMAL HIGH (ref 70–99)
Glucose-Capillary: 88 mg/dL (ref 70–99)

## 2023-03-01 LAB — RENAL FUNCTION PANEL
Albumin: 2.2 g/dL — ABNORMAL LOW (ref 3.5–5.0)
Anion gap: 13 (ref 5–15)
BUN: 94 mg/dL — ABNORMAL HIGH (ref 8–23)
CO2: 22 mmol/L (ref 22–32)
Calcium: 8.2 mg/dL — ABNORMAL LOW (ref 8.9–10.3)
Chloride: 108 mmol/L (ref 98–111)
Creatinine, Ser: 2.58 mg/dL — ABNORMAL HIGH (ref 0.61–1.24)
GFR, Estimated: 24 mL/min — ABNORMAL LOW (ref >60)
Glucose, Bld: 178 mg/dL — ABNORMAL HIGH (ref 70–99)
Phosphorus: 4.4 mg/dL (ref 2.5–4.6)
Potassium: 3.5 mmol/L (ref 3.5–5.1)
Sodium: 143 mmol/L (ref 135–145)

## 2023-03-01 LAB — PROCALCITONIN: Procalcitonin: 0.92 ng/mL

## 2023-03-01 LAB — MAGNESIUM: Magnesium: 3.5 mg/dL — ABNORMAL HIGH (ref 1.7–2.4)

## 2023-03-01 MED ORDER — OLANZAPINE 10 MG IM SOLR
2.5000 mg | Freq: Once | INTRAMUSCULAR | Status: AC | PRN
  Administered 2023-03-01: 2.5 mg via INTRAMUSCULAR
  Filled 2023-03-01: qty 10

## 2023-03-01 MED ORDER — ACETAMINOPHEN 650 MG RE SUPP: 650.0000 mg | Freq: Four times a day (QID) | RECTAL | Status: DC

## 2023-03-01 MED ORDER — APIXABAN 2.5 MG PO TABS
2.5000 mg | ORAL_TABLET | Freq: Two times a day (BID) | ORAL | Status: DC
  Administered 2023-03-01 – 2023-03-02 (×2): 2.5 mg via ORAL
  Filled 2023-03-01 (×2): qty 1

## 2023-03-01 MED ORDER — POTASSIUM CHLORIDE 20 MEQ PO PACK
40.0000 meq | PACK | ORAL | Status: AC
  Administered 2023-03-01 (×2): 40 meq
  Filled 2023-03-01 (×2): qty 2

## 2023-03-01 MED ORDER — DOCUSATE SODIUM 50 MG/5ML PO LIQD
100.0000 mg | Freq: Two times a day (BID) | ORAL | Status: DC
  Administered 2023-03-01: 100 mg via ORAL
  Filled 2023-03-01: qty 10

## 2023-03-01 MED ORDER — SENNOSIDES-DOCUSATE SODIUM 8.6-50 MG PO TABS: 1.0000 | ORAL_TABLET | Freq: Every evening | ORAL | Status: DC | PRN

## 2023-03-01 MED ORDER — AMIODARONE HCL 200 MG PO TABS: 200.0000 mg | ORAL_TABLET | Freq: Two times a day (BID) | ORAL | Status: DC

## 2023-03-01 MED ORDER — HYALURONIDASE HUMAN 150 UNIT/ML IJ SOLN
150.0000 [IU] | Freq: Once | INTRAMUSCULAR | Status: AC
  Administered 2023-03-01: 150 [IU] via SUBCUTANEOUS
  Filled 2023-03-01: qty 1

## 2023-03-01 MED ORDER — FUROSEMIDE 10 MG/ML IJ SOLN
INTRAMUSCULAR | Status: AC
  Filled 2023-03-01: qty 8

## 2023-03-01 MED ORDER — ATORVASTATIN CALCIUM 40 MG PO TABS
80.0000 mg | ORAL_TABLET | Freq: Every day | ORAL | Status: DC
  Administered 2023-03-02: 80 mg via ORAL
  Filled 2023-03-01: qty 2

## 2023-03-01 MED ORDER — AMIODARONE HCL 200 MG PO TABS
200.0000 mg | ORAL_TABLET | Freq: Two times a day (BID) | ORAL | Status: DC
  Administered 2023-03-01: 200 mg
  Filled 2023-03-01: qty 1

## 2023-03-01 MED ORDER — STERILE WATER FOR INJECTION IJ SOLN
INTRAMUSCULAR | Status: AC
  Administered 2023-03-01: 2 mL
  Filled 2023-03-01: qty 10

## 2023-03-01 MED ORDER — AMIODARONE HCL 200 MG PO TABS
200.0000 mg | ORAL_TABLET | Freq: Two times a day (BID) | ORAL | Status: DC
  Administered 2023-03-01 – 2023-03-02 (×2): 200 mg via ORAL
  Filled 2023-03-01 (×2): qty 1

## 2023-03-01 MED ORDER — ALBUMIN HUMAN 25 % IV SOLN
25.0000 g | Freq: Four times a day (QID) | INTRAVENOUS | Status: AC
  Administered 2023-03-01 – 2023-03-02 (×4): 25 g via INTRAVENOUS
  Filled 2023-03-01 (×4): qty 100

## 2023-03-01 MED ORDER — AMLODIPINE BESYLATE 5 MG PO TABS
5.0000 mg | ORAL_TABLET | Freq: Every day | ORAL | Status: DC
  Administered 2023-03-02: 5 mg via ORAL
  Filled 2023-03-01: qty 1

## 2023-03-01 MED ORDER — POLYETHYLENE GLYCOL 3350 17 G PO PACK: 17.0000 g | PACK | Freq: Every day | ORAL | Status: DC | PRN

## 2023-03-01 MED ORDER — DOXAZOSIN MESYLATE 2 MG PO TABS
2.0000 mg | ORAL_TABLET | Freq: Every day | ORAL | Status: DC
  Administered 2023-03-02: 2 mg via ORAL
  Filled 2023-03-01: qty 1

## 2023-03-01 MED ORDER — QUETIAPINE FUMARATE 25 MG PO TABS
25.0000 mg | ORAL_TABLET | Freq: Every day | ORAL | Status: DC
  Administered 2023-03-01: 25 mg via ORAL
  Filled 2023-03-01: qty 1

## 2023-03-01 MED ORDER — BETHANECHOL CHLORIDE 10 MG PO TABS
5.0000 mg | ORAL_TABLET | Freq: Three times a day (TID) | ORAL | Status: DC
  Administered 2023-03-01 – 2023-03-02 (×3): 5 mg via ORAL
  Filled 2023-03-01 (×3): qty 1

## 2023-03-01 MED ORDER — ACETAMINOPHEN 325 MG PO TABS
650.0000 mg | ORAL_TABLET | Freq: Four times a day (QID) | ORAL | Status: DC
  Administered 2023-03-01 – 2023-03-02 (×4): 650 mg via ORAL
  Filled 2023-03-01 (×4): qty 2

## 2023-03-01 MED ORDER — TICAGRELOR 90 MG PO TABS
90.0000 mg | ORAL_TABLET | Freq: Two times a day (BID) | ORAL | Status: DC
  Administered 2023-03-01 – 2023-03-02 (×2): 90 mg via ORAL
  Filled 2023-03-01 (×3): qty 1

## 2023-03-01 MED ORDER — FUROSEMIDE 10 MG/ML IJ SOLN
60.0000 mg | Freq: Once | INTRAMUSCULAR | Status: AC
  Administered 2023-03-01: 60 mg via INTRAVENOUS

## 2023-03-01 MED ORDER — FUROSEMIDE 10 MG/ML IJ SOLN
60.0000 mg | Freq: Two times a day (BID) | INTRAMUSCULAR | Status: DC
  Administered 2023-03-01 – 2023-03-02 (×2): 60 mg via INTRAVENOUS
  Filled 2023-03-01 (×2): qty 6

## 2023-03-01 NOTE — Progress Notes (Signed)
VeRN completing safety checks at approx 10:30a noted SpO2 at 55% with regular pleth form. Notified primary RN via phone at 10:34a.   Between 10:34a-10:50a primary RN assessed at bedside.   At approx 10:50a SpO2 in low 80's with regular pleth. Notified primary RN and charge via secure chat that Geronimo Boot is an available option in patient chart.   At approx 11:06a DuoNeb was administered. RT assessed pt at bedside.   At approx 11:20 pt placed on HFNC.

## 2023-03-01 NOTE — Progress Notes (Signed)
   Afib last evening, but otherwise sinus rhythm - bradycardia. Had IV infiltration overnight. Has had some loose stool. Will transition to oral amiodarone, which may be less likely to cause bradycardia. Would continue this for 7 days and decrease to 200 mg daily. No further suggestions at this time.  [facility] will sign off.   Medication Recommendations:  as above Other recommendations (labs, testing, etc):  none Follow up as an outpatient:  Dr. Asencion Islam, MD, [facility], FACP  [facility]  Medical Director of the [facility] Lipid Disorders &  Cardiovascular Risk Reduction Clinic Diplomate of the American Board of Clinical Lipidology Attending Cardiologist  Direct Dial: [phone]  Fax: [phone]  Website:  [email or web address]

## 2023-03-01 NOTE — Progress Notes (Signed)
NAME:  Xavier [name], MRN:  [id number], DOB:  [birth year], LOS: 12 ADMISSION DATE:  02/17/2023  History of Present Illness:  82 year old male with hypertension, diabetes, CKD 3B, who presented after acute onset left-sided weakness, facial droop, and aphasia.  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  Tenecteplase administered.  He was intubated for airway protection.  He underwent revascularization and stenting of occluded right MCA and revascularization of  occluded right PCA without stenting.    Was in the ICU 8/9-8/16. [facility] took over care 8/17. Remains hypoxic on HHFNC.  On 8/21 he took off his O2, desatted and brady'd. PCCM was consulted in this setting   Significant Hospital Events:  8/9 admitted to MICU at [facility] after acute cerebral infarct s/p TNK and thrombectomy, intubated for airway protection, antibiotics started for aspiration, antivirals started for COVID and flu 8/11 extubated but re-intubated for respiratory failure 8/13 extubated to heated high-flow [place] 8/14 in and out of afib 8/15 back into afib 8/16 out of ICU 8/17-8/20 Remains on HHFNC -- 10-15L. Denied at [facility]. On amio for fib.  8/21 Removed his O2, desatted, became brady. Rapid called and PCCM consulted. CXR looks about the same, some progression of R ASD maybe. Moving to the ICU for closer monitoring    Interim History / Subjective:   Removed his O2, desatted, became brady  Has since recovered.   Objective   Blood pressure (!) 140/58, pulse 94, temperature 97.7 F (36.5 C), resp. rate (!) 25, height 6\' 2"  (1.88 m), weight 80.6 kg, SpO2 93%.    FiO2 (%):  [95 %-100 %] 95 %   Intake/Output Summary (Last 24 hours) at 03/01/2023 1348 Last data filed at 03/01/2023 0750 Gross per 24 hour  Intake 1719.65 ml  Output 938 ml  Net 781.65 ml   Filed Weights   02/24/23 0500 02/25/23 0403 02/27/23 0500  Weight: 81.1 kg 79.4 kg  80.6 kg    Examination: General: ill appearing elderly M  Neuro: awake, lethargic  HEENT: cortrak.  CV: tachy, cap refill < 3 sec  Pulm: Coarse sounds + scattered rhonchi.  GI: soft ndnt  GU: foley  MSK: no acute joint deformity. 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  -if we aren't able to wean his HHFNC down to pre-desat event, would consider adding some lasix. Avoiding right now w his AKI and seemingly rapid improvement with O2 replacement   -will add on a PCT, but not starting abx at this time  R MCA, PCA infarcts -s/p TNK and thrombectomy P -brilinta + eliquis (afib) -statin   pAF  -eliquis -PO amio -- cards rec is cont for 7d then decr to 200mg  daily   AKI on CKD IIIB Urinary retention P -maintain foley -- replaced 8/20 -trend renal indices, UOP -will start urecholine   Leukocytosis, improving -afebrile -checking PCT -defer abx initiation   DM2 with hyperglycemia  P -semglee incr to 25 u daily, cont rSSI and novolog 6 u ever 4 hrs   Physical deconditioning P -PT/OT -delirium precautions  -Think he would benefit from LTAC. Sounds like he was denied-- will talk with TOC about next steps   Best practice (daily eval):  Diet/type: tubefeeds, Dys2  DVT prophylaxis: Therapeutic Eliquis GI prophylaxis: N/A Lines: N/A Foley: Foley Code Status:  full code Last date of multidisciplinary goals of care discussion [--   CRITICAL CARE Performed by: Lanier Clam   Total critical care time: 35  minutes  Critical care time was exclusive of separately billable procedures and treating other patients. Critical care was necessary to treat or prevent imminent or life-threatening deterioration.  Critical care was time spent personally by me on the following activities: development of treatment plan with patient and/or surrogate as well as nursing, discussions with consultants, evaluation of patient's response to treatment, examination of patient, obtaining history from patient or surrogate, ordering and performing treatments and interventions, ordering and review of laboratory studies, ordering and review of radiographic studies, pulse oximetry and re-evaluation of patient's condition.  Tessie Fass MSN, AGACNP-BC [facility] Pulmonary/Critical Care Medicine Amion for pager  03/01/2023, 1:48 PM

## 2023-03-01 NOTE — Progress Notes (Signed)
Assessed at bedside after arrival to MICU. Doing better since rapid response called for hypoxic respiratory failure. Reports vague concern that he's "not getting enough oxygen" but denies shortness of breath otherwise. SpO2 on monitor >92% on ~50% FiO2 via heated high-flow [place]. Telemetry shows regular sinus rhythm with ST elevation in II and aVF. He denies chest pain, arm pain, jaw pain.  HR ~60 BP 133/55 RR 26  No distress Breathing rapidly but comfortably on high-flow [place] Skin is warm and dry, not clammy Alert and oriented  EKG shows sinus rhythm and 1-2 mm elevation in leads II and aVF. This is consistent with prior EKGs, not an acute finding.  Overall clinical course is improving since arrival to MICU. Respiratory status stable with decreasing oxygen requirements. No chest pain or acute EKG changes, thus no concern for ACS. No updates to plan at this time. Patient and family updated at bedside.

## 2023-03-01 NOTE — Progress Notes (Signed)
Addendum:  Again called by patient's floor charge nurse due to patient in respiratory distress.  Immediately proceeded to patient's bedside.  Rapid response RN already at bedside.  Per patient's RN, patient had last seen him about 15 minutes ago but when rapid response RN walked in, she found patient to be off of his HHFNC oxygen, noted to be diaphoretic and in respiratory distress.  Placed back on oxygen, currently on HHFNC at 40 L/min, 40% FiO2.  Gradually his respiratory distress settled.  Also with some AMS since earlier today, not as responsive.  Patient sitting up in bed.  Currently with mild increased work of breathing.  Has [place] oxygen on.  No accessory muscles active. Bedside telemetry shows sinus tachycardia in the 110s to 130s.  As per rapid response RN, telemetry showed brief episode of bradycardia in the 30s, probably when he was hypoxic when he removed his oxygen RS: Diminished breath sounds bilaterally, more so in the bases.  Currently without rhonchi.  Occasional basal crackles.  Mild increased work of breathing CVS: S1 and S2 heard, regular tachycardia.?  JVD.  No pedal edema. CNS: On initial arrival to bedside, patient was somnolent and lethargic but he progressively became more alert and responsive and oriented.  Chest x-ray personally reviewed: Concerning for pulmonary edema versus aspiration.  Assessment and plan Acute respiratory failure with hypoxia: Current event likely precipitated by patient taking of his heated high flow nasal cannula oxygen.  May have underlying pulmonary edema versus an aspiration event but not obtained on suctioning per patient's RN.  IV Lasix 60 mg x 1 and monitor closely. CCM consulted, discussed with Dr. Katrinka Blazing at bedside, they are planning to transfer him back to ICU for close care and monitoring.  Marcellus Scott, MD,  FACP, FHM, [facility], [facility], [facility]   [facility] [place]      To contact the attending  provider between 7A-7P or the covering provider during after hours 7P-7A, please log into the web site [email or web address] and access using universal [place] password for that web site. If you do not have the password, please call the hospital operator.'

## 2023-03-01 NOTE — Progress Notes (Signed)
Noted swelling to right forearm above IV site where amiodarone infusing. Stopped IV and flushed IV, unable to get blood return and IV sluggish to flush. No redness noted at IV site or on arm. Pharmacist Jimmy notified of amiodarone infiltration and pharmacist recommended hylenex subcutaneous injection. MD also notified. IV team consulted for new IV in other arm to continue amio. Confirmed with IV team RN where to administer Hylenex.  Hylenex injected per order instructions; pt tolerated well.   Pt also having more frequent liquid stool last few hours and continues to constantly ooze stool when staff cleaning pt. Per report from day RN, flexiseal was removed yesterday due to no output.  MD notified; flexiseal placed per order; pt tolerated well.

## 2023-03-01 NOTE — Progress Notes (Signed)
Pt transported on BiPAP to 2M03 w/o any complications

## 2023-03-01 NOTE — Progress Notes (Signed)
PROGRESS NOTE  Xavier [name] ZOX:[id number] DOB: [birth year]   PCP: [name], MD  Patient is from: Home.  DOA: 02/17/2023 LOS: 12  Chief complaints Chief Complaint  Patient presents with   Code Stroke     Brief Narrative / Interim history: 82 year old M with PMH of DM-2, CKD-3B and HTN presented after acute onset left-sided weakness, facial droop and aphasia.  Code stroke activated in ED.  CT head with right MCA and PCA infarcts.  CT angio head and neck with occlusion of right MCA and PCA segments.  Received TNK and he was intubated for airway protection. underwent revascularization and stenting of occluded right MCA and revascularization of occluded right PCA without stenting. Notably, he was diagnosed with COVID and flu on same day as strokelike symptoms occurred.   Hospital course complicated by acute hypoxic respiratory failure in the setting of aspiration pneumonia, paroxysmal A-fib with RVR, delirium, AKI and urinary retention.  Respiratory failure, A-fib and AKI improved.  Nephrology signed off.  Eventually, he was extubated on 8/13 and transferred to hospitalist service on 8/17 while on 23 L / 80% by HFNC.   Patient continues to require significant oxygen up to 10-12 L by HFNC.  In and out of A-fib with RVR and bradycardia.  Cardiology consulted.  Started on amiodarone drip.  Dysphagia improved and has been upgraded to dysphagia 2 diet. However still has [employer] denied LTAC.  Prior [facility] MD attempted to call patient's insurance at [phone] for peer to peer review but no answer.  He left voicemail with patient's information, his name and callback number on 8/19.  He did not appear to receive a call back.  TOC will further explore regarding need for peer to peer.    Subjective: Alert and oriented.  Denies complaints.  Per nursing, watery diarrhea 3-4 times last night, rectal tube had to be inserted.  Objective: Vitals:   02/28/23 2050 02/28/23 2230 03/01/23 0354  03/01/23 0858  BP:   (!) 146/49   Pulse: (!) 59     Resp: (!) 22   (!) 25  Temp:  98.1 F (36.7 C) 97.6 F (36.4 C)   TempSrc:  Oral    SpO2: 95%  90% 90%  Weight:      Height:        Examination:  General exam: Elderly male, moderately built and nourished lying comfortably propped up in bed without distress. ENT: Has core track feeding tube. Respiratory system: Slightly harsh breath sounds bilaterally with scattered occasional rhonchi.  No crackles.  No increased work of breathing.  On [place] oxygen 10 L/min. Cardiovascular system: S1 & S2 heard, RRR. No JVD, murmurs, rubs, gallops or clicks. No pedal edema. Gastrointestinal system: Abdomen is nondistended, soft and nontender. No organomegaly or masses felt. Normal bowel sounds heard. GU: Has indwelling Foley catheter with straw-colored urine. Central nervous system: Alert and oriented. No focal neurological deficits. Extremities: Symmetric 5 x 5 power.  Moves all limbs symmetrically and well. Skin: No rashes, lesions or ulcers Psychiatry: Judgement and insight appear normal. Mood & affect appropriate.    Procedures:  8/9-TNK, thrombectomy and intubation for airway protection.  Antibiotic started for aspiration 8/11-extubated but reintubated due to respiratory failure. 8/13-extubated.  Microbiology summarized: COVID-19 PCR nonreactive MRSA PCR nonreactive Full RVP nonreactive Respiratory culture with normal flora Blood cultures NGTD  Assessment and plan: Principal Problem:   Acute ischemic stroke [facility]) Active Problems:   Type II diabetes mellitus with renal manifestations (HCC)  Acute renal failure superimposed on stage 3 chronic kidney disease (HCC)   Chronic kidney disease due to hypertension   Acute respiratory failure with hypoxia (HCC)   Pneumonia due to COVID-19 virus   Aspiration pneumonia of both lungs due to gastric secretions (HCC)   Influenza   Paroxysmal atrial fibrillation (HCC)   Dysphagia   Atrial  fibrillation with rapid ventricular response (HCC)  Acute right MCA and PCA infarcts  s/p TNK and revascularization with stent in MCA M1 segment and revascularization of occluded right PCA without stenting.  Moves all extremities equally today. -Continue Brilinta, Eliquis and Lipitor. -Aggressive risk reduction.  Goal SBP <160. -PT/OT.  LTAC recommended, declined by patient's insurance, TOC to explore if still needs P2P.   Acute metabolic encephalopathy/delirium:  -Reorientation and delirium precaution -Minimize sedating medications -Improved and may have even resolved.  Monitor closely.   Paroxysmal atrial fibrillation with RVR:  Intermittent RVR with bradycardia.  Cardiology consulted.  Started on amiodarone drip.  Now sinus rhythm with intermittent bradycardia. -Continue Eliquis -Optimize electrolytes -As per cardiology follow-up on 8/21, had IV infiltration overnight.  Planning to transition to amiodarone which is less likely to cause bradycardia, continue this for 7 days and decrease to 200 Mg daily.   Acute hypoxic respiratory failure, improving.  No history of COPD or asthma.  Now on 10 L by [place]. Aspiration pneumonia versus pneumonitis: COVID, full RVP and respiratory culture unrevealing. -S/p intubation and extubation. -Completed 7 days of Zosyn on 8/17. -Wean oxygen as able.  Minimum oxygen to keep saturation above 88%.  Discussed with patient's RN today. -Continue DuoNeb as needed -Aggressive pulmonary toilet -Will check repeat chest x-ray.  Addendum: About 15 to 20 minutes after had seen him, charge RN called back stating that patient was having some increased work of breathing, hypoxic in the 70s despite 12 L/min HFNC, RT was at bedside and had transitioned him to [facility] at 25 L/min. Reassessed patient at bedside, did not look in respiratory distress.  Lung findings no different than prior.  Aggressive pulmonary toilet, chest PT, incentive spirometry, follow-up chest x-ray,  consider if he needs any IV Solu-Medrol (hesitant due to uncontrolled DM) for IV Lasix (may worsen his AKI again).  Dysphagia: Upgraded to dysphagia 2 diet.  Tube feed at goal. -Follow further SLP recommendations -May need G-tube if p.o. intake does not improve and he continues to require tube feed. -Aspiration precautions -No clear documentation in flowsheet as to how much oral he is tolerating to determine if he is ready to come off the core track feeds.  Urinary retention: Was getting around 600-700 UOP with I/O cath in ICU.  Foley discontinued on 8/19. -Increased doxazosin to 2 mg daily on 8/19. -Bladder scan every 6 hrs for 2 to 3 days. -Not sure if Foley was placed back but currently has Foley catheter.   AKI on CKD-3B: Not oliguric.  Multifactorial including contrast, Zosyn and urinary retension: Renal US without acute finding.  Started to improve. Recent Labs    02/21/23 1754 02/22/23 0615 02/23/23 0051 02/24/23 5784 02/25/23 0229 02/26/23 0209 02/26/23 1455 02/27/23 0207 02/27/23 2335 03/01/23 0825  BUN 91* 96* 97* 86* 87* 102* 105* 106* 90* 94*  CREATININE 3.58* 3.38* 3.30* 2.46* 2.74* 2.87* 2.63* 2.54* 2.32* 2.58*  -Avoid nephrotoxic meds. -Continue monitoring -Nephrology signed off -Creatinine has slightly bumped up from 2.32-2.58, could be related to his diarrhea.  Clinically looks euvolemic.  Uncontrolled IDDM-2 with hypoglycemia, hyperglycemia and hyperlipidemia: A1c 9.0%.  Seems like he is on [place] at home.  Hypoglycemia in the setting of decreased tube feed. Recent Labs  Lab 02/28/23 1542 02/28/23 2120 02/28/23 2232 03/01/23 0413 03/01/23 0907  GLUCAP 203* 126* 142* 169* 224*  -Continue resistant scale every 4 hours -Increase Semglee to 25 units daily -Increase tube feed coverage from 4 to 6 units every 4 hours -Continue Lipitor -Further adjustment as appropriate.  Normocytic anemia: Slight drop in Hgb today.  No obvious bleeding. Recent  Labs    02/20/23 0032 02/21/23 0045 02/22/23 0615 02/23/23 0051 02/24/23 7425 02/25/23 0229 02/26/23 0209 02/27/23 0207 02/27/23 2335 03/01/23 0825  HGB 9.5* 8.2* 9.7* 9.0* 9.6* 9.2* 9.1* 8.2* 8.2* 8.1*  -Continue monitoring  Labile blood pressure/hypertension: BP slightly elevated today -Continue amlodipine and Cardura -Continue monitoring.  Nausea with vomiting: Seems to have resolved. -Continue Reglan every 6 hours   Hypernatremia: Na 154>> 153> > 146. -Continue free water to 300 cc every 4 hours -Resolved.  Serum sodium down to 143.   Hematuria: Resolved.  Urine in Foley catheter and bag straw-colored.   Diarrhea?  Still with rectal tube Per nursing, ongoing diarrhea and rectal tube was placed back last night. Requested RN to communicate with registered dietitian to see if tube feeds can be changed and if those are the ones causing diarrhea.  Low index of suspicion for C. difficile.  Could consider Imodium.   Physical deconditioning/disposition:  -PT/OT.  Please see discussion above.  Leukocytosis: Unclear source of this for this.  Just finished IV Zosyn on 8/17.  Improving off antibiotics. -Continue monitoring  Nutrition/inadequate oral intake/dysphagia: Upgraded to dysphagia 2 diet. Body mass index is 22.81 kg/m. Nutrition Problem: Inadequate oral intake Etiology: acute illness Signs/Symptoms: NPO status Interventions: Refer to RD note for recommendations  Pressure skin injury: Not POA.  Pressure Injury 02/26/23 Buttocks Right;Left Stage 2 -  Partial thickness loss of dermis presenting as a shallow open injury with a red, pink wound bed without slough. (Active)  02/26/23 1600  Location: Buttocks  Location Orientation: Right;Left  Staging: Stage 2 -  Partial thickness loss of dermis presenting as a shallow open injury with a red, pink wound bed without slough.  Wound Description (Comments):   Present on Admission:   Dressing Type Foam - Lift dressing to  assess site every shift 03/01/23 0100   DVT prophylaxis:  apixaban (ELIQUIS) tablet 2.5 mg Start: 02/23/23 1030 Place and maintain sequential compression device Start: 02/17/23 2140 apixaban (ELIQUIS) tablet 2.5 mg  Code Status: Full code Family Communication: None at bedside today. Level of care: Progressive Status is: Inpatient Remains inpatient appropriate because: Acute respiratory failure with significant oxygen requirement, dysphagia requiring core track feeding, urinary retention on Foley catheter, diarrhea with rectal tube.   Final disposition: Insurance declined LTAC.  Called for peer to peer review on 8/19 but no answer.  He likely goes to SNF if oxygen requirement improves.  May need reliable means of feeding if p.o. intake does not improve Consultants:  Neurology Nephrology Pulmonology Cardiology  55 minutes with more than 50% spent in reviewing records, counseling patient/family and coordinating care.   Sch Meds:  Scheduled Meds:  acetaminophen  650 mg Per Tube Q6H   Or   acetaminophen  650 mg Rectal Q6H   amiodarone  200 mg Per Tube BID   amLODipine  5 mg Per Tube Daily   apixaban  2.5 mg Per Tube BID   atorvastatin  80 mg Per Tube Daily  Chlorhexidine Gluconate Cloth  6 each Topical Q0600   docusate  100 mg Per Tube BID   doxazosin  2 mg Per Tube Daily   free water  300 mL Per Tube Q4H   insulin aspart  0-20 Units Subcutaneous Q4H   insulin aspart  6 Units Subcutaneous Q4H   insulin glargine-yfgn  25 Units Subcutaneous Daily   metoCLOPramide (REGLAN) injection  5 mg Intravenous Q8H   mouth rinse  15 mL Mouth Rinse 4 times per day   QUEtiapine  25 mg Per Tube QHS   ticagrelor  90 mg Per Tube BID   Continuous Infusions:  sodium chloride Stopped (02/20/23 2337)   feeding supplement (GLUCERNA 1.5 CAL) 55 mL/hr at 02/28/23 0800   PRN Meds:.iohexol, ipratropium-albuterol, nitroGLYCERIN, ondansetron (ZOFRAN) IV, mouth rinse, polyethylene glycol,  senna-docusate  Antimicrobials: Anti-infectives (From admission, onward)    Start     Dose/Rate Route Frequency Ordered Stop   02/24/23 2000  piperacillin-tazobactam (ZOSYN) IVPB 3.375 g        3.375 g 12.5 mL/hr over 240 Minutes Intravenous Every 8 hours 02/24/23 1305 02/25/23 1807   02/20/23 1000  piperacillin-tazobactam (ZOSYN) IVPB 2.25 g  Status:  Discontinued        2.25 g 100 mL/hr over 30 Minutes Intravenous Every 6 hours 02/20/23 0900 02/24/23 1305   02/19/23 2200  oseltamivir (TAMIFLU) capsule 30 mg  Status:  Discontinued        30 mg Per Tube Daily 02/19/23 0928 02/20/23 0908   02/18/23 1000  remdesivir 100 mg in sodium chloride 0.9 % 100 mL IVPB       Placed in "Followed by" Linked Group   100 mg 200 mL/hr over 30 Minutes Intravenous Daily 02/17/23 2123 02/19/23 1004   02/17/23 2330  remdesivir 200 mg in sodium chloride 0.9% 250 mL IVPB       Placed in "Followed by" Linked Group   200 mg 580 mL/hr over 30 Minutes Intravenous Once 02/17/23 2123 02/18/23 0031   02/17/23 2215  oseltamivir (TAMIFLU) capsule 75 mg  Status:  Discontinued        75 mg Per Tube 2 times daily 02/17/23 2120 02/17/23 2124   02/17/23 2215  oseltamivir (TAMIFLU) capsule 30 mg  Status:  Discontinued        30 mg Per Tube 2 times daily 02/17/23 2124 02/19/23 0928   02/17/23 2200  cefTRIAXone (ROCEPHIN) 2 g in sodium chloride 0.9 % 100 mL IVPB  Status:  Discontinued        2 g 200 mL/hr over 30 Minutes Intravenous Every 24 hours 02/17/23 2050 02/20/23 0900   02/17/23 2200  azithromycin (ZITHROMAX) 500 mg in sodium chloride 0.9 % 250 mL IVPB  Status:  Discontinued        500 mg 250 mL/hr over 60 Minutes Intravenous Every 24 hours 02/17/23 2050 02/21/23 1033        I have personally reviewed the following labs and images: CBC: Recent Labs  Lab 02/25/23 0229 02/26/23 0209 02/27/23 0207 02/27/23 2335 03/01/23 0825  WBC 18.3* 22.0* 19.0* 17.8* 15.5*  NEUTROABS  --   --  17.1*  --   --   HGB  9.2* 9.1* 8.2* 8.2* 8.1*  HCT 30.4* 30.0* 27.8* 27.5* 26.8*  MCV 85.4 87.0 86.6 86.5 85.6  PLT 248 280 286 322 360   BMP &GFR Recent Labs  Lab 02/25/23 2157 02/26/23 0209 02/26/23 1455 02/27/23 0207 02/27/23 2335 03/01/23 0825  NA  --  153* 148* 148* 146* 143  K  --  3.6 3.4* 3.7 3.9 3.5  CL  --  116* 115* 114* 112* 108  CO2  --  24 22 22 24 22   GLUCOSE  --  96 255* 185* 159* 178*  BUN  --  102* 105* 106* 90* 94*  CREATININE  --  2.87* 2.63* 2.54* 2.32* 2.58*  CALCIUM  --  8.6* 8.2* 8.2* 8.2* 8.2*  MG 3.6* 3.5*  --  3.4* 3.3* 3.5*  PHOS  --  4.4 5.0* 4.7* 4.3 4.4   Estimated Creatinine Clearance: 25.2 mL/min (A) (by C-G formula based on SCr of 2.58 mg/dL (H)). Liver & Pancreas: Recent Labs  Lab 02/26/23 0209 02/26/23 1455 02/27/23 0207 02/27/23 2335 03/01/23 0825  AST 37  --   --   --   --   ALT 51*  --   --   --   --   ALKPHOS 75  --   --   --   --   BILITOT 0.6  --   --   --   --   PROT 6.9  --   --   --   --   ALBUMIN 2.6* 2.2* 2.3* 2.2* 2.2*    Diabetic: No results for input(s): "HGBA1C" in the last 72 hours. Recent Labs  Lab 02/28/23 1542 02/28/23 2120 02/28/23 2232 03/01/23 0413 03/01/23 0907  GLUCAP 203* 126* 142* 169* 224*    Urine analysis:    Component Value Date/Time   COLORURINE YELLOW 02/18/2023 0137   APPEARANCEUR CLEAR 02/18/2023 0137   LABSPEC >1.046 (H) 02/18/2023 0137   PHURINE 5.0 02/18/2023 0137   GLUCOSEU >=500 (A) 02/18/2023 0137   HGBUR NEGATIVE 02/18/2023 0137   BILIRUBINUR NEGATIVE 02/18/2023 0137   KETONESUR NEGATIVE 02/18/2023 0137   PROTEINUR 30 (A) 02/18/2023 0137   UROBILINOGEN 1.0 09/28/2014 1645   NITRITE NEGATIVE 02/18/2023 0137   LEUKOCYTESUR NEGATIVE 02/18/2023 0137   Sepsis Labs: Invalid input(s): "PROCALCITONIN", "LACTICIDVEN"  Microbiology: Recent Results (from the past 240 hour(s))  Culture, Respiratory w Gram Stain     Status: None   Collection Time: 02/20/23  9:21 AM   Specimen: Tracheal Aspirate;  Respiratory  Result Value Ref Range Status   Specimen Description TRACHEAL ASPIRATE  Final   Special Requests NONE  Final   Gram Stain NO WBC SEEN NO ORGANISMS SEEN   Final   Culture   Final    RARE Normal respiratory flora-no Staph aureus or Pseudomonas seen Performed at [facility], [address], [place], [place] [address]    Report Status 02/22/2023 FINAL  Final  SARS Coronavirus 2 by RT PCR (hospital order, performed in [facility] hospital lab) *cepheid single result test* Anterior Nasal Swab     Status: None   Collection Time: 02/20/23 10:35 AM   Specimen: Anterior Nasal Swab  Result Value Ref Range Status   SARS Coronavirus 2 by RT PCR NEGATIVE NEGATIVE Final    Comment: Performed at [facility], [address] [place], [place] [address]  Respiratory (~20 pathogens) panel by PCR     Status: None   Collection Time: 02/20/23 10:35 AM   Specimen: Nasopharyngeal Swab; Respiratory  Result Value Ref Range Status   Adenovirus NOT DETECTED NOT DETECTED Final   Coronavirus 229E NOT DETECTED NOT DETECTED Final    Comment: (NOTE) The Coronavirus on the Respiratory Panel, DOES NOT test for the novel  Coronavirus (2019 nCoV)    Coronavirus  HKU1 NOT DETECTED NOT DETECTED Final   Coronavirus NL63 NOT DETECTED NOT DETECTED Final   Coronavirus OC43 NOT DETECTED NOT DETECTED Final   Metapneumovirus NOT DETECTED NOT DETECTED Final   Rhinovirus / Enterovirus NOT DETECTED NOT DETECTED Final   Influenza A NOT DETECTED NOT DETECTED Final   Influenza B NOT DETECTED NOT DETECTED Final   Parainfluenza Virus 1 NOT DETECTED NOT DETECTED Final   Parainfluenza Virus 2 NOT DETECTED NOT DETECTED Final   Parainfluenza Virus 3 NOT DETECTED NOT DETECTED Final   Parainfluenza Virus 4 NOT DETECTED NOT DETECTED Final   Respiratory Syncytial Virus NOT DETECTED NOT DETECTED Final   Bordetella pertussis NOT DETECTED NOT DETECTED Final   Bordetella Parapertussis NOT DETECTED NOT DETECTED Final    Chlamydophila pneumoniae NOT DETECTED NOT DETECTED Final   Mycoplasma pneumoniae NOT DETECTED NOT DETECTED Final    Comment: Performed at [facility], [address], [place], [place] [address]    Radiology Studies: No results found.   Marcellus Scott, MD,  FACP, FHM, [facility], [facility], [facility]   [facility] [place]      To contact the attending provider between 7A-7P or the covering provider during after hours 7P-7A, please log into the web site [email or web address] and access using universal [place] password for that web site. If you do not have the password, please call the hospital operator.

## 2023-03-01 NOTE — Progress Notes (Addendum)
Patient's nurse reported that his IV line has been infiltrated.  Pharmacy has been placed in order for Hylenex 150 units once for the IV site infiltration. Nurse has been switched to the IV site. Patient also has recurrent loose stool.  Rectal tube has been removed yesterday however patient continues to have persistent loose stool. Placing rectal tube back again.   Tereasa Coop, MD [facility] 03/01/2023, 6:24 AM

## 2023-03-01 NOTE — Progress Notes (Signed)
Pt removed from Bipap at thgis time to [facility] @ 25L/50%. Pt tolerating well.  Bipap on standby for later or at bedtime use.  RT will continue to monitor.

## 2023-03-01 NOTE — Progress Notes (Signed)
   Palliative Medicine Inpatient Follow Up Note  Chart review complete.  Care level elevated in the setting of recurrent respiratory failure. Family has been aware of this possible reality in the setting of patients stroke(s).   Goals per patient and family geared towards full scope of care - confirmed on multiple occasions as well as with patient when he was coherent.  The PMT remains peripheral though can be contacted at anytime if family preferences change or patients clinical condition should further deteriorate.  Agree that the best setting for Xavier [name] is an LTACH.   No Charge ______________________________________________________________________________________ Xavier [name]  Palliative Medicine Team Team Cell Phone: [phone] Please utilize secure chat with additional questions, if there is no response within 30 minutes please call the above phone number  Palliative Medicine Team providers are available by phone from 7am to 7pm daily and can be reached through the team cell phone.  Should this patient require assistance outside of these hours, please call the patient's attending physician.

## 2023-03-01 NOTE — Progress Notes (Signed)
PT Cancellation Note  Patient Details Name: Xavier [name] MRN: [id number] DOB: 1940-10-30   Cancelled Treatment:    Reason Eval/Treat Not Completed: Medical issues which prohibited therapy  Spoke with RN; reports pt having increased oxygen demands. Staff assisting pt in room. Will attempt to follow-up this afternoon as appropriate and schedule allows.  Kathlyn [place], PT, DPT [facility]  Rehabilitation Services Physical Therapist Office: [phone] Website: [email or web address]   [place] 03/01/2023, 11:30 AM

## 2023-03-01 NOTE — Significant Event (Signed)
Rapid Response Event Note   Reason for Call :  Patient removed his oxygen and became very labored and bradycardic  Per staff: they increased his oxygen from 10-12L [place] to heated high flow 40L/95% about a 1.5 hours ago because he had desated and had labored breathing.  He had been stable on heated HF prior to removing his oxygen.  Initial Focused Assessment:  Upon my arrival he is on heated HF O2 40L/95%.  He is obtunded.  He is diaphoretic and very labored. Lung sounds decreased bases, scattered crackles Heart tones irregular  BP 118/72  HR 112  RR 26  temp 97.7   Interventions:  After about 10-15 min he started to wake up and is able to speak and is back to his hospital neurological baseline.  He is now warm and dry and his respiratory status has improved.    CCM consulted  Transferred to 2M03 [facility] in personal belongings bag with patient.   Plan of Care:     Event Summary:   MD Notified:  Call Time: 1251 Arrival Time: End Time:  Marcellina Millin, RN

## 2023-03-01 NOTE — Progress Notes (Signed)
eLink Physician-Brief Progress Note Patient Name: Xavier [name] DOB: [birth year] MRN: [id number]   Date of Service  03/01/2023  HPI/Events of Note  Patient with agitated delirium.  eICU Interventions  Zyprexa 2.5 mg IM x 1, Bilateral soft wrist restraints to prevent pulling out of tubes and lines.        Migdalia Dk 03/01/2023, 9:09 PM

## 2023-03-02 ENCOUNTER — Inpatient Hospital Stay (HOSPITAL_COMMUNITY): Payer: PPO

## 2023-03-02 DIAGNOSIS — R531 Weakness: Secondary | ICD-10-CM | POA: Diagnosis not present

## 2023-03-02 DIAGNOSIS — I63512 Cerebral infarction due to unspecified occlusion or stenosis of left middle cerebral artery: Secondary | ICD-10-CM | POA: Diagnosis not present

## 2023-03-02 DIAGNOSIS — R739 Hyperglycemia, unspecified: Secondary | ICD-10-CM

## 2023-03-02 DIAGNOSIS — R1310 Dysphagia, unspecified: Secondary | ICD-10-CM

## 2023-03-02 DIAGNOSIS — R410 Disorientation, unspecified: Secondary | ICD-10-CM

## 2023-03-02 DIAGNOSIS — R4701 Aphasia: Secondary | ICD-10-CM

## 2023-03-02 DIAGNOSIS — J9601 Acute respiratory failure with hypoxia: Secondary | ICD-10-CM | POA: Diagnosis not present

## 2023-03-02 DIAGNOSIS — I639 Cerebral infarction, unspecified: Secondary | ICD-10-CM | POA: Diagnosis not present

## 2023-03-02 LAB — BASIC METABOLIC PANEL
Anion gap: 12 (ref 5–15)
BUN: 97 mg/dL — ABNORMAL HIGH (ref 8–23)
CO2: 21 mmol/L — ABNORMAL LOW (ref 22–32)
Calcium: 8.3 mg/dL — ABNORMAL LOW (ref 8.9–10.3)
Chloride: 112 mmol/L — ABNORMAL HIGH (ref 98–111)
Creatinine, Ser: 2.91 mg/dL — ABNORMAL HIGH (ref 0.61–1.24)
GFR, Estimated: 21 mL/min — ABNORMAL LOW (ref >60)
Glucose, Bld: 139 mg/dL — ABNORMAL HIGH (ref 70–99)
Potassium: 4.7 mmol/L (ref 3.5–5.1)
Sodium: 145 mmol/L (ref 135–145)

## 2023-03-02 LAB — ABO/RH: ABO/RH(D): O POS

## 2023-03-02 LAB — CBC
HCT: 23.8 % — ABNORMAL LOW (ref 39.0–52.0)
HCT: 21.6 % — ABNORMAL LOW (ref 39.0–52.0)
Hemoglobin: 7.3 g/dL — ABNORMAL LOW (ref 13.0–17.0)
Hemoglobin: 6.5 g/dL — CL (ref 13.0–17.0)
MCH: 26.4 pg (ref 26.0–34.0)
MCH: 26 pg (ref 26.0–34.0)
MCHC: 30.7 g/dL (ref 30.0–36.0)
MCHC: 30.1 g/dL (ref 30.0–36.0)
MCV: 86.2 fL (ref 80.0–100.0)
MCV: 86.4 fL (ref 80.0–100.0)
Platelets: 343 10*3/uL (ref 150–400)
Platelets: 303 10*3/uL (ref 150–400)
RBC: 2.76 MIL/uL — ABNORMAL LOW (ref 4.22–5.81)
RBC: 2.5 MIL/uL — ABNORMAL LOW (ref 4.22–5.81)
RDW: 14.6 % (ref 11.5–15.5)
RDW: 14.9 % (ref 11.5–15.5)
WBC: 13.7 10*3/uL — ABNORMAL HIGH (ref 4.0–10.5)
WBC: 11.5 10*3/uL — ABNORMAL HIGH (ref 4.0–10.5)
nRBC: 0 % (ref 0.0–0.2)
nRBC: 0 % (ref 0.0–0.2)

## 2023-03-02 LAB — GLUCOSE, CAPILLARY
Glucose-Capillary: 111 mg/dL — ABNORMAL HIGH (ref 70–99)
Glucose-Capillary: 123 mg/dL — ABNORMAL HIGH (ref 70–99)
Glucose-Capillary: 140 mg/dL — ABNORMAL HIGH (ref 70–99)
Glucose-Capillary: 143 mg/dL — ABNORMAL HIGH (ref 70–99)
Glucose-Capillary: 149 mg/dL — ABNORMAL HIGH (ref 70–99)
Glucose-Capillary: 162 mg/dL — ABNORMAL HIGH (ref 70–99)
Glucose-Capillary: 190 mg/dL — ABNORMAL HIGH (ref 70–99)
Glucose-Capillary: 192 mg/dL — ABNORMAL HIGH (ref 70–99)
Glucose-Capillary: 192 mg/dL — ABNORMAL HIGH (ref 70–99)

## 2023-03-02 LAB — POCT I-STAT 7, (LYTES, BLD GAS, ICA,H+H)
Acid-base deficit: 7 mmol/L — ABNORMAL HIGH (ref 0.0–2.0)
Bicarbonate: 16.9 mmol/L — ABNORMAL LOW (ref 20.0–28.0)
Calcium, Ion: 1.06 mmol/L — ABNORMAL LOW (ref 1.15–1.40)
HCT: 19 % — ABNORMAL LOW (ref 39.0–52.0)
Hemoglobin: 6.5 g/dL — CL (ref 13.0–17.0)
O2 Saturation: 88 %
Patient temperature: 98.3
Potassium: 5.6 mmol/L — ABNORMAL HIGH (ref 3.5–5.1)
Sodium: 146 mmol/L — ABNORMAL HIGH (ref 135–145)
TCO2: 18 mmol/L — ABNORMAL LOW (ref 22–32)
pCO2 arterial: 25.4 mmHg — ABNORMAL LOW (ref 32–48)
pH, Arterial: 7.43 (ref 7.35–7.45)
pO2, Arterial: 52 mmHg — ABNORMAL LOW (ref 83–108)

## 2023-03-02 LAB — PREPARE RBC (CROSSMATCH)

## 2023-03-02 MED ORDER — ATORVASTATIN CALCIUM 40 MG PO TABS: 80.0000 mg | ORAL_TABLET | Freq: Every day | ORAL | Status: DC

## 2023-03-02 MED ORDER — AMIODARONE HCL 200 MG PO TABS: 200.0000 mg | ORAL_TABLET | Freq: Two times a day (BID) | ORAL | Status: DC

## 2023-03-02 MED ORDER — PANTOPRAZOLE SODIUM 40 MG IV SOLR
40.0000 mg | Freq: Two times a day (BID) | INTRAVENOUS | Status: DC
  Administered 2023-03-02: 40 mg via INTRAVENOUS
  Filled 2023-03-02: qty 10

## 2023-03-02 MED ORDER — FUROSEMIDE 10 MG/ML IJ SOLN
100.0000 mg | Freq: Once | INTRAVENOUS | Status: AC
  Administered 2023-03-02: 100 mg via INTRAVENOUS
  Filled 2023-03-02: qty 10

## 2023-03-02 MED ORDER — PROSOURCE TF20 ENFIT COMPATIBL EN LIQD
60.0000 mL | Freq: Every day | ENTERAL | Status: DC
  Administered 2023-03-02: 60 mL
  Filled 2023-03-02: qty 60

## 2023-03-02 MED ORDER — OLANZAPINE 10 MG IM SOLR
2.5000 mg | Freq: Once | INTRAMUSCULAR | Status: AC
  Administered 2023-03-02: 2.5 mg via INTRAMUSCULAR
  Filled 2023-03-02: qty 10

## 2023-03-02 MED ORDER — AMIODARONE HCL 200 MG PO TABS: 200.0000 mg | ORAL_TABLET | Freq: Every day | ORAL | Status: DC

## 2023-03-02 MED ORDER — TICAGRELOR 90 MG PO TABS
90.0000 mg | ORAL_TABLET | Freq: Two times a day (BID) | ORAL | Status: DC
  Administered 2023-03-02: 90 mg
  Filled 2023-03-02: qty 1

## 2023-03-02 MED ORDER — STERILE WATER FOR INJECTION IJ SOLN
INTRAMUSCULAR | Status: AC
  Administered 2023-03-02: 2.5 mL
  Filled 2023-03-02: qty 10

## 2023-03-02 MED ORDER — MELATONIN 3 MG PO TABS
3.0000 mg | ORAL_TABLET | Freq: Every day | ORAL | Status: DC
  Administered 2023-03-02: 3 mg
  Filled 2023-03-02: qty 1

## 2023-03-02 MED ORDER — ATROPINE SULFATE 1 MG/10ML IJ SOSY
PREFILLED_SYRINGE | INTRAMUSCULAR | Status: AC
  Administered 2023-03-02: 1 mg
  Filled 2023-03-02: qty 10

## 2023-03-02 MED ORDER — STERILE WATER FOR INJECTION IJ SOLN
INTRAMUSCULAR | Status: AC
  Administered 2023-03-02: 10 mL
  Filled 2023-03-02: qty 10

## 2023-03-02 MED ORDER — OLANZAPINE 10 MG IM SOLR
2.5000 mg | Freq: Once | INTRAMUSCULAR | Status: AC | PRN
  Administered 2023-03-02: 2.5 mg via INTRAMUSCULAR
  Filled 2023-03-02: qty 10

## 2023-03-02 MED ORDER — ACETAMINOPHEN 325 MG PO TABS
650.0000 mg | ORAL_TABLET | Freq: Four times a day (QID) | ORAL | Status: DC
  Administered 2023-03-02: 650 mg
  Filled 2023-03-02: qty 2

## 2023-03-02 MED ORDER — BETHANECHOL CHLORIDE 10 MG PO TABS
5.0000 mg | ORAL_TABLET | Freq: Three times a day (TID) | ORAL | Status: DC
  Administered 2023-03-02 (×2): 5 mg
  Filled 2023-03-02 (×2): qty 1

## 2023-03-02 MED ORDER — SODIUM CHLORIDE 0.9% IV SOLUTION: Freq: Once | INTRAVENOUS | Status: DC

## 2023-03-02 MED ORDER — ALBUMIN HUMAN 5 % IV SOLN
25.0000 g | Freq: Once | INTRAVENOUS | Status: AC
  Administered 2023-03-02: 25 g via INTRAVENOUS
  Filled 2023-03-02: qty 500

## 2023-03-02 MED ORDER — DOXAZOSIN MESYLATE 2 MG PO TABS: 2.0000 mg | ORAL_TABLET | Freq: Every day | ORAL | Status: DC

## 2023-03-02 MED ORDER — KATE FARMS STANDARD 1.4 PO LIQD
1000.0000 mL | ORAL | Status: DC
  Administered 2023-03-02: 1000 mL
  Filled 2023-03-02 (×2): qty 1300

## 2023-03-02 MED ORDER — APIXABAN 2.5 MG PO TABS: 2.5000 mg | ORAL_TABLET | Freq: Two times a day (BID) | ORAL | Status: DC

## 2023-03-02 MED ORDER — AMLODIPINE BESYLATE 5 MG PO TABS: 5.0000 mg | ORAL_TABLET | Freq: Every day | ORAL | Status: DC

## 2023-03-02 MED ORDER — ATROPINE SULFATE 1 MG/10ML IJ SOSY: 0.5000 mg | PREFILLED_SYRINGE | INTRAMUSCULAR | Status: DC | PRN

## 2023-03-02 MED ORDER — ATROPINE SULFATE 1 MG/10ML IJ SOSY
0.5000 mg | PREFILLED_SYRINGE | Freq: Once | INTRAMUSCULAR | Status: AC
  Administered 2023-03-02: 0.5 mg via INTRAVENOUS

## 2023-03-02 MED ORDER — QUETIAPINE FUMARATE 25 MG PO TABS
25.0000 mg | ORAL_TABLET | Freq: Every day | ORAL | Status: DC
  Administered 2023-03-02: 25 mg
  Filled 2023-03-02: qty 1

## 2023-03-03 ENCOUNTER — Encounter: Payer: Self-pay | Admitting: Internal Medicine

## 2023-03-03 LAB — TYPE AND SCREEN
ABO/RH(D): O POS
Antibody Screen: NEGATIVE
Unit division: 0

## 2023-03-03 LAB — BPAM RBC
Blood Product Expiration Date: 202409172359
ISSUE DATE / TIME: 202408221745
Unit Type and Rh: 5100

## 2023-03-03 NOTE — Progress Notes (Signed)
eLink Physician-Brief Progress Note Patient Name: Xavier [name] DOB: 1940-12-13 MRN: [id number]   Date of Service  03/03/2023  HPI/Events of Note  CXR consistent with aspiration.  eICU Interventions  Family requesting transition to comfort measures.        Neita Landrigan U Makinzy Cleere 03/03/2023, 12:00 AM

## 2023-03-03 NOTE — Progress Notes (Signed)
Patient with sudden decline. Confirmed with family DNR/DNI status. Patient cardiac time of death 03-23-57, pronounced by Cloyd Stagers, PA and Mallie Mussel, RN. Emotional support provided to family.

## 2023-03-06 ENCOUNTER — Ambulatory Visit: Payer: PPO | Admitting: Neurology

## 2023-03-06 NOTE — Accreditation Note (Signed)
Restraint Death within 24 hours of release of 2 point soft bilateral wrist restraints logged 03/06/2023 at 1529 by Taysha Majewski RNBSN.

## 2023-03-12 NOTE — Progress Notes (Signed)
Speech Language Pathology Treatment: Dysphagia  Patient Details Name: Xavier [name] MRN: [id number] DOB: 1941-04-09 Today's Date:  Time: 1000-1015 SLP Time Calculation (min) (ACUTE ONLY): 15 min  Assessment / Plan / Recommendation Clinical Impression  RN reports pt alert, able to be redirected but observed to have very wet respirations. She was concerned about aspirating from regurgitated free water from tube feed. SLP observed pt is on 65% O2 at 25Lpm. RR fluctuating between 30 and 40. Pt attentive to speaker, but confused. Unable to clear airway with cued coughing. When offered ice pt couldn't attend to it. Recommend NPO with f/u for readiness to resume PO when mentation and respiration more appropriate.   HPI HPI: Patient is an 82 y.o. male with PMH: type 2 diabetes mellitus, prostate cancer, and essential hypertension. He presented to the hospital on 02/17/2023 after syncopal episode with associated foaming at the mouth and facial asymmetry. [name] reported to ED staff that patient had not been himself over past few months with significant drowsiness and hypersomnia. He was diagnosed with Covid by his PCP on afternoon of admission and while in the car on way home, left side of face began to droop and he lost consciousness and made a regurgitating sound. 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 He underwent mechanical thrombectomy on 8/9. He was intubated for procedure, extubated aftewards but had to be reintubated on 8/11 due to hypoxia. He was extubated on 8/13. [name] 8/14.      SLP Plan  Continue with current plan of care      Recommendations for follow up therapy are one component of a multi-disciplinary discharge planning process, led by the attending physician.  Recommendations may be updated based on patient status, additional functional criteria and insurance authorization.    Recommendations  Diet recommendations: NPO                               Continue with current plan of care     Xavier [name], [name]  , 11:49 AM

## 2023-03-12 NOTE — Plan of Care (Signed)
Pt extremely restless during the shift, MD notified received Zyprexa IM x2 per N.O. short term relief noted. Pt tolerating NGT feeds. Continues on HFNC at 50% O2. Foley and flexiseal intact. Good uop. Bilateral wrist restraints and mitts intact, pt continuously pulling on linen and attempting to pull on flexi and foley. Safety maintained, will continue to monitor.

## 2023-03-12 NOTE — Plan of Care (Signed)
 Problem: Education: Goal: Knowledge of disease or condition will improve Outcome: Progressing Goal: Knowledge of secondary prevention will improve (MUST DOCUMENT ALL) Outcome: Progressing Goal: Knowledge of patient specific risk factors will improve Loraine Leriche N/A or DELETE if not current risk factor) Outcome: Progressing   Problem: Ischemic Stroke/TIA Tissue Perfusion: Goal: Complications of ischemic stroke/TIA will be minimized Outcome: Progressing   Problem: Coping: Goal: Will verbalize positive feelings about self Outcome: Progressing Goal: Will identify appropriate support needs Outcome: Progressing   Problem: Health Behavior/Discharge Planning: Goal: Ability to manage health-related needs will improve Outcome: Progressing Goal: Goals will be collaboratively established with patient/family Outcome: Progressing   Problem: Self-Care: Goal: Ability to participate in self-care as condition permits will improve Outcome: Progressing Goal: Verbalization of feelings and concerns over difficulty with self-care will improve Outcome: Progressing Goal: Ability to communicate needs accurately will improve Outcome: Progressing   Problem: Nutrition: Goal: Risk of aspiration will decrease Outcome: Progressing Goal: Dietary intake will improve Outcome: Progressing   Problem: Education: Goal: Understanding of CV disease, CV risk reduction, and recovery process will improve Outcome: Progressing Goal: Individualized Educational Video(s) Outcome: Progressing   Problem: Activity: Goal: Ability to return to baseline activity level will improve Outcome: Progressing   Problem: Cardiovascular: Goal: Ability to achieve and maintain adequate cardiovascular perfusion will improve Outcome: Progressing Goal: Vascular access site(s) Level 0-1 will be maintained Outcome: Progressing   Problem: Health Behavior/Discharge Planning: Goal: Ability to safely manage health-related needs after  discharge will improve Outcome: Progressing   Problem: Education: Goal: Knowledge of General Education information will improve Description: Including pain rating scale, medication(s)/side effects and non-pharmacologic comfort measures Outcome: Progressing   Problem: Health Behavior/Discharge Planning: Goal: Ability to manage health-related needs will improve Outcome: Progressing   Problem: Clinical Measurements: Goal: Ability to maintain clinical measurements within normal limits will improve Outcome: Progressing Goal: Will remain free from infection Outcome: Progressing Goal: Diagnostic test results will improve Outcome: Progressing Goal: Respiratory complications will improve Outcome: Progressing Goal: Cardiovascular complication will be avoided Outcome: Progressing   Problem: Activity: Goal: Risk for activity intolerance will decrease Outcome: Progressing   Problem: Nutrition: Goal: Adequate nutrition will be maintained Outcome: Progressing   Problem: Coping: Goal: Level of anxiety will decrease Outcome: Progressing   Problem: Elimination: Goal: Will not experience complications related to bowel motility Outcome: Progressing Goal: Will not experience complications related to urinary retention Outcome: Progressing   Problem: Pain Managment: Goal: General experience of comfort will improve Outcome: Progressing   Problem: Safety: Goal: Ability to remain free from injury will improve Outcome: Progressing   Problem: Skin Integrity: Goal: Risk for impaired skin integrity will decrease Outcome: Progressing   Problem: Activity: Goal: Ability to tolerate increased activity will improve Outcome: Progressing   Problem: Respiratory: Goal: Ability to maintain a clear airway and adequate ventilation will improve Outcome: Progressing   Problem: Role Relationship: Goal: Method of communication will improve Outcome: Progressing   Problem: Education: Goal:  Knowledge of risk factors and measures for prevention of condition will improve Outcome: Progressing   Problem: Coping: Goal: Psychosocial and spiritual needs will be supported Outcome: Progressing   Problem: Respiratory: Goal: Will maintain a patent airway Outcome: Progressing Goal: Complications related to the disease process, condition or treatment will be avoided or minimized Outcome: Progressing   Problem: Education: Goal: Ability to describe self-care measures that may prevent or decrease complications (Diabetes Survival Skills Education) will improve Outcome: Progressing Goal: Individualized Educational Video(s) Outcome: Progressing   Problem: Coping: Goal: Ability to adjust  to condition or change in health will improve Outcome: Progressing   Problem: Fluid Volume: Goal: Ability to maintain a balanced intake and output will improve Outcome: Progressing   Problem: Health Behavior/Discharge Planning: Goal: Ability to identify and utilize available resources and services will improve Outcome: Progressing Goal: Ability to manage health-related needs will improve Outcome: Progressing

## 2023-03-12 NOTE — Consult Note (Signed)
XII: Unable to assess Motor/Sensory: LUE with flailing movements in the setting of agitation.  LLE withdraws to noxious RUE 0/5 to noxious with flaccid tone at rest RLE 0/5 except for triple flexion response to noxious plantar stimulation Deep Tendon Reflexes: Unable to obtain reliable reflexes in the context of agitation.  Cerebellar: Unable to assess Gait: Unable to assess   Lab Results: Basic Metabolic Panel: Recent Labs  Lab 02/25/23 2157 02/26/23 0209 02/26/23 1455 02/27/23 0207 02/27/23 2335 03/01/23 0825 03/01/23 1528  0137  1654  NA  --  153* 148* 148* 146* 143 146* 145 146*  K  --  3.6 3.4* 3.7 3.9 3.5 4.2 4.7 5.6*  CL  --  116* 115* 114* 112* 108  --  112*  --   CO2  --  24 22 22 24 22   --  21*  --   GLUCOSE  --  96 255* 185* 159* 178*  --  139*  --   BUN  --  102* 105* 106* 90* 94*  --  97*  --   CREATININE  --  2.87* 2.63* 2.54* 2.32* 2.58*  --  2.91*  --   CALCIUM  --  8.6* 8.2* 8.2* 8.2* 8.2*  --  8.3*  --   MG 3.6* 3.5*  --  3.4* 3.3* 3.5*  --   --   --   PHOS  --  4.4 5.0* 4.7* 4.3 4.4  --   --   --     CBC: Recent Labs  Lab 02/27/23 0207 02/27/23 2335 03/01/23 0825 03/01/23 1114 03/01/23 1528  0137  1354  1654  WBC 19.0* 17.8* 15.5* 14.8*  --  13.7* 11.5*  --   NEUTROABS 17.1*  --   --   --   --   --   --   --   HGB 8.2* 8.2* 8.1* 8.0* 7.5* 7.3* 6.5* 6.5*  HCT 27.8* 27.5* 26.8* 26.9* 22.0* 23.8* 21.6* 19.0*  MCV 86.6 86.5 85.6 88.2  --  86.2 86.4  --    PLT 286 322 360 343  --  343 303  --     Cardiac Enzymes: No results for input(s): "CKTOTAL", "CKMB", "CKMBINDEX", "TROPONINI" in the last 168 hours.  Lipid Panel: Recent Labs  Lab 02/24/23 0635  TRIG 148    Imaging: DG CHEST PORT 1 VIEW  Result Date: 03/01/2023 CLINICAL DATA:  Hypoxia EXAM: PORTABLE CHEST 1 VIEW COMPARISON:  02/23/2023 FINDINGS: Feeding tube extends into the distal stomach. Stable heart size. Persistent interstitial opacities throughout both lungs, right worse than left. Opacity becoming more confluent within the left perihilar region. Small right pleural effusion appears slightly loculated. No pneumothorax. IMPRESSION: 1. Persistent interstitial opacities throughout both lungs, right worse than left. Opacity becoming more confluent within the left perihilar region. 2. Small right pleural effusion appears slightly loculated. Electronically Signed   By: Duanne Guess D.O.   On: 03/01/2023 12:50    Assessment: 82 y.o. 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 Code Stroke was called. - Exam reveals an obtunded and agitated patient who is nonverbal and not responding to any commands. Right hemiplegia is noted.  - STAT  XII: Unable to assess Motor/Sensory: LUE with flailing movements in the setting of agitation.  LLE withdraws to noxious RUE 0/5 to noxious with flaccid tone at rest RLE 0/5 except for triple flexion response to noxious plantar stimulation Deep Tendon Reflexes: Unable to obtain reliable reflexes in the context of agitation.  Cerebellar: Unable to assess Gait: Unable to assess   Lab Results: Basic Metabolic Panel: Recent Labs  Lab 02/25/23 2157 02/26/23 0209 02/26/23 1455 02/27/23 0207 02/27/23 2335 03/01/23 0825 03/01/23 1528  0137  1654  NA  --  153* 148* 148* 146* 143 146* 145 146*  K  --  3.6 3.4* 3.7 3.9 3.5 4.2 4.7 5.6*  CL  --  116* 115* 114* 112* 108  --  112*  --   CO2  --  24 22 22 24 22   --  21*  --   GLUCOSE  --  96 255* 185* 159* 178*  --  139*  --   BUN  --  102* 105* 106* 90* 94*  --  97*  --   CREATININE  --  2.87* 2.63* 2.54* 2.32* 2.58*  --  2.91*  --   CALCIUM  --  8.6* 8.2* 8.2* 8.2* 8.2*  --  8.3*  --   MG 3.6* 3.5*  --  3.4* 3.3* 3.5*  --   --   --   PHOS  --  4.4 5.0* 4.7* 4.3 4.4  --   --   --     CBC: Recent Labs  Lab 02/27/23 0207 02/27/23 2335 03/01/23 0825 03/01/23 1114 03/01/23 1528  0137  1354  1654  WBC 19.0* 17.8* 15.5* 14.8*  --  13.7* 11.5*  --   NEUTROABS 17.1*  --   --   --   --   --   --   --   HGB 8.2* 8.2* 8.1* 8.0* 7.5* 7.3* 6.5* 6.5*  HCT 27.8* 27.5* 26.8* 26.9* 22.0* 23.8* 21.6* 19.0*  MCV 86.6 86.5 85.6 88.2  --  86.2 86.4  --    PLT 286 322 360 343  --  343 303  --     Cardiac Enzymes: No results for input(s): "CKTOTAL", "CKMB", "CKMBINDEX", "TROPONINI" in the last 168 hours.  Lipid Panel: Recent Labs  Lab 02/24/23 0635  TRIG 148    Imaging: DG CHEST PORT 1 VIEW  Result Date: 03/01/2023 CLINICAL DATA:  Hypoxia EXAM: PORTABLE CHEST 1 VIEW COMPARISON:  02/23/2023 FINDINGS: Feeding tube extends into the distal stomach. Stable heart size. Persistent interstitial opacities throughout both lungs, right worse than left. Opacity becoming more confluent within the left perihilar region. Small right pleural effusion appears slightly loculated. No pneumothorax. IMPRESSION: 1. Persistent interstitial opacities throughout both lungs, right worse than left. Opacity becoming more confluent within the left perihilar region. 2. Small right pleural effusion appears slightly loculated. Electronically Signed   By: Duanne Guess D.O.   On: 03/01/2023 12:50    Assessment: 82 y.o. 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 Code Stroke was called. - Exam reveals an obtunded and agitated patient who is nonverbal and not responding to any commands. Right hemiplegia is noted.  - STAT  Scheduled:  sodium chloride   Intravenous Once   acetaminophen  650 mg Per Tube Q6H   amiodarone  200 mg Per Tube BID   Followed by   Melene Muller ON 03/16/2023] amiodarone  200 mg Oral Daily   amLODipine  5 mg Per Tube Daily   atorvastatin  80 mg Per Tube Daily   bethanechol  5 mg Per Tube TID   Chlorhexidine Gluconate Cloth  6 each Topical Q0600   doxazosin  2 mg Per Tube Daily   feeding supplement (PROSource TF20)  60 mL Per Tube Daily   free water  300 mL Per Tube Q4H   insulin aspart  0-20 Units Subcutaneous Q4H   insulin glargine-yfgn  25 Units Subcutaneous Daily   melatonin  3 mg Per Tube QHS   metoCLOPramide (REGLAN) injection  5 mg Intravenous Q8H   mouth rinse  15 mL Mouth Rinse 4 times per day   pantoprazole (PROTONIX) IV  40 mg Intravenous Q12H   QUEtiapine  25 mg Per Tube QHS   ticagrelor  90 mg Per Tube BID   Continuous:  sodium chloride Stopped (02/20/23 2337)   feeding supplement (KATE FARMS STANDARD 1.4) 60 mL/hr at  2029     ROS:                                                                                                                                       Unable to obtain due to decreased level of consciousness with agitation.    Blood pressure (!) 155/44, pulse (!) 57, temperature 98.3 F (36.8 C), temperature source Oral, resp. rate (!) 30, height 6\' 2"  (1.88 m), weight 83.2 kg, SpO2 95%.   General Examination:                                                                                                       Physical Exam  HEENT:  [place]/AT Lungs: Labored respirations on nonrebreather mask Extremities: Warm and well perfused.   Neurological Examination Mental Status: Obtunded and agitated with eye closed  and intermittently flailing LUE. Nonverbal and not responding to any questions and commands.   Cranial Nerves: II: No reliable blink to threat bilaterally. PERRL.  III,IV, VI: Eyes are midline without nystagmus. No exotropia, esotropia or intorsion noted. Does not gaze towards or away from visual stimuli.   V: Reacts to eyelid stimulation bilaterally VII: Grossly symmetric VIII: Does not respond to voice IX,X: Gag reflex deferred XI: No neck stiffness  Scheduled:  sodium chloride   Intravenous Once   acetaminophen  650 mg Per Tube Q6H   amiodarone  200 mg Per Tube BID   Followed by   Melene Muller ON 03/16/2023] amiodarone  200 mg Oral Daily   amLODipine  5 mg Per Tube Daily   atorvastatin  80 mg Per Tube Daily   bethanechol  5 mg Per Tube TID   Chlorhexidine Gluconate Cloth  6 each Topical Q0600   doxazosin  2 mg Per Tube Daily   feeding supplement (PROSource TF20)  60 mL Per Tube Daily   free water  300 mL Per Tube Q4H   insulin aspart  0-20 Units Subcutaneous Q4H   insulin glargine-yfgn  25 Units Subcutaneous Daily   melatonin  3 mg Per Tube QHS   metoCLOPramide (REGLAN) injection  5 mg Intravenous Q8H   mouth rinse  15 mL Mouth Rinse 4 times per day   pantoprazole (PROTONIX) IV  40 mg Intravenous Q12H   QUEtiapine  25 mg Per Tube QHS   ticagrelor  90 mg Per Tube BID   Continuous:  sodium chloride Stopped (02/20/23 2337)   feeding supplement (KATE FARMS STANDARD 1.4) 60 mL/hr at  2029     ROS:                                                                                                                                       Unable to obtain due to decreased level of consciousness with agitation.    Blood pressure (!) 155/44, pulse (!) 57, temperature 98.3 F (36.8 C), temperature source Oral, resp. rate (!) 30, height 6\' 2"  (1.88 m), weight 83.2 kg, SpO2 95%.   General Examination:                                                                                                       Physical Exam  HEENT:  [place]/AT Lungs: Labored respirations on nonrebreather mask Extremities: Warm and well perfused.   Neurological Examination Mental Status: Obtunded and agitated with eye closed  and intermittently flailing LUE. Nonverbal and not responding to any questions and commands.   Cranial Nerves: II: No reliable blink to threat bilaterally. PERRL.  III,IV, VI: Eyes are midline without nystagmus. No exotropia, esotropia or intorsion noted. Does not gaze towards or away from visual stimuli.   V: Reacts to eyelid stimulation bilaterally VII: Grossly symmetric VIII: Does not respond to voice IX,X: Gag reflex deferred XI: No neck stiffness

## 2023-03-12 NOTE — Progress Notes (Signed)
Nutrition Follow-up  DOCUMENTATION CODES:  Not applicable  INTERVENTION:  Continue diet as ordered by SLP when alert Feeding assistance TF via cortrak, change formula for diarrhea management: Molli Posey RTH 1.4 at 60 ml/h (1440 ml per day) Prosource TF20 1x/d Free water per MD, 300 q4h Provides 2096 kcal, 109 gm protein, 1037 ml free water daily ( free water, TF+flush)  NUTRITION DIAGNOSIS:  Inadequate oral intake related to acute illness as evidenced by NPO status. - remains applicable  GOAL:  Patient will meet greater than or equal to 90% of their needs - progressing, TF meeting needs  MONITOR:  PO intake, TF tolerance, Diet advancement, Labs  REASON FOR ASSESSMENT:  Consult Enteral/tube feeding initiation and management  ASSESSMENT:   Pt admitted with acute L-sided weakness d/t acute ischemic stroke following outpatient MD appointment where he was diagnosed with COVID and flu however pt tested negative on admission. PMH significant for T2DM, prostate cancer, essential HTN, CKD IIIb.  8/9 - s/p TNK, thrombectomy, intubated for airway protection 8/10 - tube feeding initiated (OGT side port gastric) 8/11 - extubated, re-intubated 8/13 - extubated, NGT remains in place 8/14 - cortrak placed 8/16 - vomiting, TF held 8/18 - Trickle TF restarted, SLP advanced to full/nectar thick liquids 8/19 - SLP evaluation, DYS 2 with thin liquids 8/20 - transferred to floor 8/21 - back to ICU 8/22 - SLP evaluation, NPO recommended  Pt resting in bed at the time of assessment. Cortrak remains in place with TF on hold this AM after pt vomited while being cleaned up. Pt more agitated today and requiring restraints.   New consult from MD for management of diarrhea. Pt with FMS in place. Will adjust feeds to Molli Posey to attempt to manage diarrhea. Discussed with RN and MD. Will monitor for changes in stool output. Could consider adding banatrol if additional interventions are needed.    Average Meal Intake: 8/19-8/20: 8% average intake x 3 recorded meals   Intake/Output Summary (Last 24 hours) at 03/10/2023 1254 Last data filed at 03/11/2023 0400 Gross per 24 hour  Intake 627.47 ml  Output 990 ml  Net -362.53 ml  Net IO Since Admission: 7,314.24 mL [02/21/2023 1254]  Nutritionally Relevant Medications: Scheduled Meds:  atorvastatin  80 mg Oral Daily   docusate  100 mg Oral BID   doxazosin  2 mg Oral Daily   free water  300 mL Per Tube Q4H   furosemide  60 mg Intravenous Q12H   insulin aspart  0-20 Units Subcutaneous Q4H   insulin aspart  6 Units Subcutaneous Q4H   insulin glargine-yfgn  25 Units Subcutaneous Daily   metoCLOPramide   5 mg Intravenous Q8H   Continuous Infusions:  albumin human 25 g (02/23/2023 0513)   feeding supplement (GLUCERNA 1.5 CAL) 55 mL/hr at 03/10/2023 0300   PRN Meds: ondansetron, polyethylene glycol, senna-docusate  Labs Reviewed: Chloride 112 BUN 97, creatinine 2.91 Mg 3.5 CBG ranges from 88-289 mg/dL over the last 24 hours HgbA1c 9%  NUTRITION - FOCUSED PHYSICAL EXAM: Flowsheet Row Most Recent Value  Orbital Region No depletion  Upper Arm Region No depletion  Thoracic and Lumbar Region No depletion  Buccal Region No depletion  Temple Region No depletion  Clavicle Bone Region No depletion  Clavicle and Acromion Bone Region No depletion  Scapular Bone Region No depletion  Dorsal Hand Unable to assess  [mittens]  Patellar Region Mild depletion  Anterior Thigh Region Moderate depletion  Posterior Calf Region Mild depletion  Edema (  RD Assessment) None  Hair Reviewed  Eyes Reviewed  Mouth Reviewed  Skin Reviewed  Nails Reviewed    Diet Order:   Diet Order             Diet NPO time specified Except for: Ice Chips  Diet effective now                   EDUCATION NEEDS:  Not appropriate for education at this time  Skin:  Skin Assessment: Reviewed RN Assessment  Last BM:  2023/03/20 - type 7 via FMS  Height:  Ht  Readings from Last 1 Encounters:  02/22/23 6\' 2"  (1.88 m)    Weight:  Wt Readings from Last 1 Encounters:  03/01/23 83.2 kg    Ideal Body Weight:  86.4 kg  BMI:  Body mass index is 23.55 kg/m.  Estimated Nutritional Needs:  Kcal:  2000-2200 Protein:  100-115g Fluid:  >/=2L    Greig Castilla, RD, LDN Clinical Dietitian RD pager # available in AMION  After hours/weekend pager # available in [facility]

## 2023-03-12 NOTE — Progress Notes (Signed)
[facility] signoff note   The care of this patient has been transferred from the [facility] service to the following service:  Service: PCCM Attending: Dr. Levon Hedger. I have discussed with Dr. Katrinka Blazing on 8/21 Date & time: , 7:05 AM  Patient remains critically ill and in the ICU. Chart briefly reviewed.  Ongoing issues with agitation overnight requiring physical restraints, chemical restraints.  Remains with significant O2 requirement.   Please re consult the [facility] team for any further assistance.  Thank you   [employer]. MD FACP, FHM, SFHM, CMPC, [facility]   [facility] Physician Advisor [place]      To contact a [facility] provider, please log into the web site [email or web address] and access using universal [place] password for that web site. If you do not have the password, please call the hospital operator. Page "[facility] Admits & Consults" listed on top of the page. Provide a number where you can be directly reached.

## 2023-03-12 NOTE — Progress Notes (Signed)
eLink Physician-Brief Progress Note Patient Name: GABOR GAUNA DOB: [birth year] MRN: [id number]   Date of Service    HPI/Events of Note  Heart rate 45 - 57 on Amiodarone gtt, MAP 82 mmHg.  eICU Interventions  Amiodarone dose held. Close monitoring of heart rate and blood pressure, Will treat heart rate < 45 if accompanied by hypotension.        Migdalia Dk , 8:18 PM

## 2023-03-12 NOTE — Progress Notes (Signed)
Repeat H/H 6.5/21.6-- down ~2g in 1 day.   Apixaban stopped, start empiric high dose PPI. BUN not jumping up much from where it has previously been. Keep brilinta for now; hopefully will not have to stop this too.  Monitor for signs of bleeding.  Check FOBT. May need to CT to look for RP bleeding 1 unit pRBC now; type and screen needed.  Steffanie Dunn, DO  4:10 PM [facility]  For contact information, see Amion. If no response to pager, please call PCCM consult pager. After hours, 7PM- 7AM, please call Elink.

## 2023-03-12 NOTE — Progress Notes (Signed)
eLink Physician-Brief Progress Note Patient Name: Xavier [name] DOB: [birth year] MRN: [id number]   Date of Service    HPI/Events of Note  Patient with continuing agitation.  eICU Interventions  Zyprexa 2.5 mg IM x 1 ordered.        Thomasene Lot Toba Claudio , 1:59 AM

## 2023-03-12 NOTE — Progress Notes (Signed)
NAME:  EARLY KONDOR, MRN:  [id number], DOB:  [birth year], LOS: 13 ADMISSION DATE:  08-Mar-2023  History of Present Illness:  82 year old male with hypertension, diabetes, CKD 3B, who presented after acute onset left-sided weakness, facial droop, and aphasia.  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  Tenecteplase administered.  He was intubated for airway protection.  He underwent revascularization and stenting of occluded right MCA and revascularization of  occluded right PCA without stenting.    Was in the ICU 8/9-8/16. [facility] took over care 8/17. Remains hypoxic on HHFNC.  On 8/21 he took off his O2, desatted and brady'd. PCCM was consulted in this setting   Significant Hospital Events:  03-08-23 admitted to MICU at [facility] after acute cerebral infarct s/p TNK and thrombectomy, intubated for airway protection, antibiotics started for aspiration, antivirals started for COVID and flu 8/11 extubated but re-intubated for respiratory failure 8/13 extubated to heated high-flow [place] 8/14 in and out of afib 8/15 back into afib 8/16 out of ICU 8/17-8/20 Remains on HHFNC -- 10-15L. Denied at [facility]. On amio for fib.  8/21 Removed his O2, desatted, became brady. Rapid called and PCCM consulted. CXR looks about the same, some progression of R ASD maybe. Moving to the ICU for closer monitoring    Interim History / Subjective:  This morning he denies complaints.  He required 2 doses of zyprexa overnight for agitation. Vomited this morning when he was turned.   Objective   Blood pressure (!) 150/130, pulse 67, temperature 98.9 F (37.2 C), temperature source Oral, resp. rate (!) 33, height 6\' 2"  (1.88 m), weight 83.2 kg, SpO2 92%.    FiO2 (%):  [50 %-100 %] 52 % PEEP:  [5 cmH20] 5 cmH20 Pressure Support:  [5 cmH20] 5 cmH20   Intake/Output Summary (Last 24 hours) at 02/23/2023 1041 Last data filed at 03/11/2023  0400 Gross per 24 hour  Intake 627.47 ml  Output 990 ml  Net -362.53 ml   Filed Weights   02/25/23 0403 02/27/23 0500 03/01/23 1523  Weight: 79.4 kg 80.6 kg 83.2 kg    Examination: General: Chronically ill-appearing man lying in bed no acute distress Neuro: Awake, not alert, keeping his eyes closed but nodding to answer questions and giving a thumbs up.  Globally weak.  Can follow commands after a short delay HEENT: Poor dentition, oral mucosa moist.  Core track in place CV: Regular rate regular rhythm Pulm: Breathing comfortably on nasal cannula, rhonchi on the right side.  Weak cough. GI: Soft, nontender GU: Foley with amber-colored urine MSK: Some edema in the right upper extremity from previous IV extravasation Skin: No bruising, no diffuse rashes  BUN 97 Cr 2.91 WBC 13.7 H/H 7.3/23.8 Platelets 343 CXR 8/21 personally reviewed> RUL and lingular opacities most predominant.  PCT 0.92 on 8/21  Resolved Hospital Problem list   Endotracheally intubated  Hematuria  Hypernatremia  Assessment & Plan:    Acute respiratory failure with hypoxia Bilateral opacities R>L; worry about pneumonia vs aspiration pneumonitis. Off antibiotics since 8/17 and WBC downtrending since. No fevers but gets frequent tylenol.  High risk for recurrent pneumonias. Small R pleural effusion Recent aspiration PNA Dysphagia  -con't TF with cortrak to limit aspiration potential. Had dysphagia 2 diet; too confused to try to eat today.  SLP continuing to follow. -lasix +  1 dose of albumin -supplemental O2 as required to  maintain SpO2 >90% -pulmonary hygiene -chest CT may be needed to relook at opacities; worry he may need steroids to help with pneumonitis- possibly post-infectious? -trend PCT, but not convinced right now that he has a new pneumonia  Agitated delirium -zyprexa PRN now -increase seroquel to 50mg  BID -still needing restraints unfortunately -melatonin at bedtime added -work on  getting foley out again; con't bethanechol and doxazosin for now -hopefully won't need steroids for lungs, which will likely worsen agitation.  -trying to avoid benzos -Has a longtime family friend at bedside today  R MCA, PCA infarcts -s/p TNK and thrombectomy -Continue statin - Continue Brilinta and Eliquis for atrial fibrillation  Paroxysmal AF  -Continue eliquis -Continue PO amio -- cards rec is cont for 7d then decr to 200mg  daily, but did not get full IV load  AKI on CKD IIIB Urinary retention hematuria -Continue doxazosin and Urecholine - Try to remove Foley today to aid delirium treatment - Renally dose meds and avoid nephrotoxic meds - Continue to monitor  Leukocytosis, improving -Continue to monitor - Follow-up procalcitonin again tomorrow  Anemia, worse today -recheck CBC this afternoon. If still dropping, will stop eliquis -monitor for bleeding  DM2 with hyperglycemia- controlled -Continue Semglee 25 units daily - SSI as needed - Goal blood glucose 140-180  Physical deconditioning -PT, OT, SLP - Delirium precautions -Recommend LTAC at discharge.  Appeal letter signed today.  Appreciate TOC team's assistance.  Mr. Rosann Auerbach friend was updated at bedside today.  He continues to require ICU care for high oxygen requirements and uncontrolled delirium.  Best practice (daily eval):  Diet/type: tubefeeds  DVT prophylaxis: Eliquis GI prophylaxis: N/A Lines: N/A Foley: Foley- remove today Code Status:  full code Last date of multidisciplinary goals of care discussion [--     Steffanie Dunn, DO March 12, 2023 12:45 PM [facility]  For contact information, see Amion. If no response to pager, please call PCCM consult pager. After hours, 7PM- 7AM, please call Elink.

## 2023-03-12 NOTE — Death Summary Note (Signed)
Head CT yesterday and earlier. FINDINGS: Brain: Regressed gyriform and sulcal hyperdensity in the right occipital lobe since last night. No definite intraventricular blood. No ventriculomegaly or midline shift. Basilar cisterns remain normal. Underlying right occipital cytotoxic edema or encephalomalacia. No evolving right MCA infarct is evident and elsewhere gray-white differentiation is stable. Vascular: Right MCA M1 region vascular stent redemonstrated. Less retained intravascular contrast at this time. Skull: Intact.  No acute osseous abnormality identified. Sinuses/Orbits: Stable paranasal sinuses, tympanic cavities and mastoids. Patient remains intubated on the scout view. Other: No acute orbit or scalp soft tissue finding. IMPRESSION: 1. Persistent but regressed right occipital hyperdensity since last night. Favor subarachnoid hemorrhage (Heidelberg classification 3c: Subarachnoid hemorrhage) over contrast staining. No intracranial mass effect. 2. Underlying right PCA territory cytotoxic edema or encephalomalacia. No evolving right MCA territory infarct is evident. 3. No new intracranial abnormality. Electronically Signed   By: Odessa Fleming M.D.   On: 02/18/2023 11:46   DG  CHEST PORT 1 VIEW  Result Date: 02/18/2023 CLINICAL DATA:  [id number] Acute respiratory failure with hypoxia (HCC) [id number] EXAM: PORTABLE CHEST 1 VIEW COMPARISON:  Chest radiograph from one day prior. FINDINGS: Endotracheal tube tip is 4.2 cm above the carina. Enteric tube terminates in the gastric fundus. Esophageal temperature probe terminates in the proximal stomach. Stable cardiomediastinal silhouette with normal heart size. No pneumothorax. No pleural effusion. New borderline mild pulmonary edema. Increased hazy bibasilar lung opacities. IMPRESSION: 1. Well-positioned support structures. 2. New borderline mild pulmonary edema. 3. Increased hazy bibasilar lung opacities, favor atelectasis. Electronically Signed   By: Delbert Phenix M.D.   On: 02/18/2023 08:08   CT HEAD WO CONTRAST ( )  Result Date: 03/07/2023 CLINICAL DATA:  Stroke follow-up EXAM: CT HEAD WITHOUT CONTRAST TECHNIQUE: Contiguous axial images were obtained from the base of the skull through the vertex without intravenous contrast. RADIATION DOSE REDUCTION: This exam was performed according to the departmental dose-optimization program which includes automated exposure control, adjustment of the mA and/or kV according to patient size and/or use of iterative reconstruction technique. COMPARISON:  Head CT 02/24/2023 FINDINGS: Brain: There is subarachnoid hyperdensity over the posterior right hemisphere, possibly contrast staining. Loss of gray-white differentiation in the posterior right MCA territory is unchanged. No midline shift or other mass effect. Vascular: There is now a stent in the right MCA. Skull: The visualized skull base, calvarium and extracranial soft tissues are normal. Sinuses/Orbits: Bilateral maxillary retention cysts. The orbits are normal. IMPRESSION: 1. 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 2. Hyperdensity over the posterior right hemisphere, possibly contrast staining. Attention on follow-up.  Electronically Signed   By: Deatra Robinson M.D.   On: 02/12/2023 22:11   CT ANGIO HEAD NECK W WO CM (CODE STROKE)  Result Date: 03/11/2023 CLINICAL DATA:  Neuro deficit, acute, stroke suspected EXAM: CT ANGIOGRAPHY HEAD AND NECK WITH AND WITHOUT CONTRAST TECHNIQUE: Multidetector CT imaging of the head and neck was performed using the standard protocol during bolus administration of intravenous contrast. Multiplanar CT image reconstructions and MIPs were obtained to evaluate the vascular anatomy. Carotid stenosis measurements (when applicable) are obtained utilizing NASCET criteria, using the distal internal carotid diameter as the denominator. RADIATION DOSE REDUCTION: This exam was performed according to the departmental dose-optimization program which includes automated exposure control, adjustment of the mA and/or kV according to patient size and/or use of iterative reconstruction technique. CONTRAST:  75mL OMNIPAQUE IOHEXOL 350 MG/ML SOLN COMPARISON:  Same day CT head FINDINGS: CT HEAD FINDINGS See same day CT  Head CT yesterday and earlier. FINDINGS: Brain: Regressed gyriform and sulcal hyperdensity in the right occipital lobe since last night. No definite intraventricular blood. No ventriculomegaly or midline shift. Basilar cisterns remain normal. Underlying right occipital cytotoxic edema or encephalomalacia. No evolving right MCA infarct is evident and elsewhere gray-white differentiation is stable. Vascular: Right MCA M1 region vascular stent redemonstrated. Less retained intravascular contrast at this time. Skull: Intact.  No acute osseous abnormality identified. Sinuses/Orbits: Stable paranasal sinuses, tympanic cavities and mastoids. Patient remains intubated on the scout view. Other: No acute orbit or scalp soft tissue finding. IMPRESSION: 1. Persistent but regressed right occipital hyperdensity since last night. Favor subarachnoid hemorrhage (Heidelberg classification 3c: Subarachnoid hemorrhage) over contrast staining. No intracranial mass effect. 2. Underlying right PCA territory cytotoxic edema or encephalomalacia. No evolving right MCA territory infarct is evident. 3. No new intracranial abnormality. Electronically Signed   By: Odessa Fleming M.D.   On: 02/18/2023 11:46   DG  CHEST PORT 1 VIEW  Result Date: 02/18/2023 CLINICAL DATA:  [id number] Acute respiratory failure with hypoxia (HCC) [id number] EXAM: PORTABLE CHEST 1 VIEW COMPARISON:  Chest radiograph from one day prior. FINDINGS: Endotracheal tube tip is 4.2 cm above the carina. Enteric tube terminates in the gastric fundus. Esophageal temperature probe terminates in the proximal stomach. Stable cardiomediastinal silhouette with normal heart size. No pneumothorax. No pleural effusion. New borderline mild pulmonary edema. Increased hazy bibasilar lung opacities. IMPRESSION: 1. Well-positioned support structures. 2. New borderline mild pulmonary edema. 3. Increased hazy bibasilar lung opacities, favor atelectasis. Electronically Signed   By: Delbert Phenix M.D.   On: 02/18/2023 08:08   CT HEAD WO CONTRAST ( )  Result Date: 03/07/2023 CLINICAL DATA:  Stroke follow-up EXAM: CT HEAD WITHOUT CONTRAST TECHNIQUE: Contiguous axial images were obtained from the base of the skull through the vertex without intravenous contrast. RADIATION DOSE REDUCTION: This exam was performed according to the departmental dose-optimization program which includes automated exposure control, adjustment of the mA and/or kV according to patient size and/or use of iterative reconstruction technique. COMPARISON:  Head CT 02/24/2023 FINDINGS: Brain: There is subarachnoid hyperdensity over the posterior right hemisphere, possibly contrast staining. Loss of gray-white differentiation in the posterior right MCA territory is unchanged. No midline shift or other mass effect. Vascular: There is now a stent in the right MCA. Skull: The visualized skull base, calvarium and extracranial soft tissues are normal. Sinuses/Orbits: Bilateral maxillary retention cysts. The orbits are normal. IMPRESSION: 1. 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 2. Hyperdensity over the posterior right hemisphere, possibly contrast staining. Attention on follow-up.  Electronically Signed   By: Deatra Robinson M.D.   On: 02/12/2023 22:11   CT ANGIO HEAD NECK W WO CM (CODE STROKE)  Result Date: 03/11/2023 CLINICAL DATA:  Neuro deficit, acute, stroke suspected EXAM: CT ANGIOGRAPHY HEAD AND NECK WITH AND WITHOUT CONTRAST TECHNIQUE: Multidetector CT imaging of the head and neck was performed using the standard protocol during bolus administration of intravenous contrast. Multiplanar CT image reconstructions and MIPs were obtained to evaluate the vascular anatomy. Carotid stenosis measurements (when applicable) are obtained utilizing NASCET criteria, using the distal internal carotid diameter as the denominator. RADIATION DOSE REDUCTION: This exam was performed according to the departmental dose-optimization program which includes automated exposure control, adjustment of the mA and/or kV according to patient size and/or use of iterative reconstruction technique. CONTRAST:  75mL OMNIPAQUE IOHEXOL 350 MG/ML SOLN COMPARISON:  Same day CT head FINDINGS: CT HEAD FINDINGS See same day CT  3.1 8.0  > 7.0       Bel elbow 8.1 - 7.1 -  Bel elbow-Wrist 26 50  > 50  Ab elbow 10.1 - 6.5 -  Ab elbow-Bel elbow 10 50 -  Sensory Sites   Neg Peak Lat Amplitude (O-P) Segment Distance Velocity Comment Site (ms) Norm (V) Norm  (cm) (ms)  Left Median Sensory Wrist-Dig II *NR  < 3.8 *NR  > 10 Wrist-Dig II 13   Left Radial Sensory Forearm-Wrist 2.2  < 2.8 *8  > 10 Forearm-Wrist 10   Left Superficial Fibular Sensory 14 cm-Ankle *NR  < 4.6 *NR  > 3 14 cm-Ankle 14   Left Sural Sensory Calf-Lat mall *NR  < 4.6 *NR  > 3 Calf-Lat mall 14   Left Ulnar Sensory Wrist-Dig V *NR  < 3.2 *NR  > 5 Wrist-Dig V 11   H-Reflex Results   M-Lat H Lat H Neg Amp H-M Lat Site (ms) (ms) Norm (mV) (ms) Left Tibial H-Reflex Pop fossa 10.0 ---  < 35.0 --- --- EMG+  Side Muscle Ins.Act Fibs Fasc Recrt Amp Dur Poly Activation Comment Left Tib ant Nml *1+ Nml *SMU *1+ *1+ *1+ Nml N/A Left Gastroc MH Nml Nml Nml Nml Nml Nml Nml Nml N/A Left FDL Nml Nml Nml *2- *1+ *1+ *1+ Nml N/A Left Fib longus Nml Nml Nml *1- *1+ *1+ *1+ Nml N/A Left Rectus fem Nml Nml Nml Nml Nml Nml Nml Nml N/A Left Biceps fem SH Nml Nml Nml Nml Nml Nml Nml Nml N/A Left Gluteus med Nml Nml Nml Nml Nml Nml Nml Nml N/A Left L5 PSP Nml Nml Nml Nml Nml Nml Nml Nml N/A Nerve Measurements  Site Area Mobility Vascularity Comment  mm Norm    Left Fibular Fib head 7.7  < 17.8     Pop fossa 9.2  < 20.9     Waveforms: Motor         Sensory         H-Reflex        Microbiology No results  found for this or any previous visit (from the past 240 hour(s)).  Lab Basic Metabolic Panel: Recent Labs  Lab 02/27/23 2335 03/01/23 0825 03/01/23 1528  0137  1654  NA 146* 143 146* 145 146*  K 3.9 3.5 4.2 4.7 5.6*  CL 112* 108  --  112*  --   CO2 24 22  --  21*  --   GLUCOSE 159* 178*  --  139*  --   BUN 90* 94*  --  97*  --   CREATININE 2.32* 2.58*  --  2.91*  --   CALCIUM 8.2* 8.2*  --  8.3*  --   MG 3.3* 3.5*  --   --   --   PHOS 4.3 4.4  --   --   --    Liver Function Tests: Recent Labs  Lab 02/27/23 2335 03/01/23 0825  ALBUMIN 2.2* 2.2*   No results for input(s): "LIPASE", "AMYLASE" in the last 168 hours. No results for input(s): "AMMONIA" in the last 168 hours. CBC: Recent Labs  Lab 02/27/23 2335 03/01/23 0825 03/01/23 1114 03/01/23 1528  0137  1354  1654  WBC 17.8* 15.5* 14.8*  --  13.7* 11.5*  --   HGB 8.2* 8.1* 8.0* 7.5* 7.3* 6.5* 6.5*  HCT 27.5* 26.8* 26.9* 22.0* 23.8* 21.6* 19.0*  MCV 86.5 85.6 88.2  --  86.2 86.4  --   PLT 322  3.1 8.0  > 7.0       Bel elbow 8.1 - 7.1 -  Bel elbow-Wrist 26 50  > 50  Ab elbow 10.1 - 6.5 -  Ab elbow-Bel elbow 10 50 -  Sensory Sites   Neg Peak Lat Amplitude (O-P) Segment Distance Velocity Comment Site (ms) Norm (V) Norm  (cm) (ms)  Left Median Sensory Wrist-Dig II *NR  < 3.8 *NR  > 10 Wrist-Dig II 13   Left Radial Sensory Forearm-Wrist 2.2  < 2.8 *8  > 10 Forearm-Wrist 10   Left Superficial Fibular Sensory 14 cm-Ankle *NR  < 4.6 *NR  > 3 14 cm-Ankle 14   Left Sural Sensory Calf-Lat mall *NR  < 4.6 *NR  > 3 Calf-Lat mall 14   Left Ulnar Sensory Wrist-Dig V *NR  < 3.2 *NR  > 5 Wrist-Dig V 11   H-Reflex Results   M-Lat H Lat H Neg Amp H-M Lat Site (ms) (ms) Norm (mV) (ms) Left Tibial H-Reflex Pop fossa 10.0 ---  < 35.0 --- --- EMG+  Side Muscle Ins.Act Fibs Fasc Recrt Amp Dur Poly Activation Comment Left Tib ant Nml *1+ Nml *SMU *1+ *1+ *1+ Nml N/A Left Gastroc MH Nml Nml Nml Nml Nml Nml Nml Nml N/A Left FDL Nml Nml Nml *2- *1+ *1+ *1+ Nml N/A Left Fib longus Nml Nml Nml *1- *1+ *1+ *1+ Nml N/A Left Rectus fem Nml Nml Nml Nml Nml Nml Nml Nml N/A Left Biceps fem SH Nml Nml Nml Nml Nml Nml Nml Nml N/A Left Gluteus med Nml Nml Nml Nml Nml Nml Nml Nml N/A Left L5 PSP Nml Nml Nml Nml Nml Nml Nml Nml N/A Nerve Measurements  Site Area Mobility Vascularity Comment  mm Norm    Left Fibular Fib head 7.7  < 17.8     Pop fossa 9.2  < 20.9     Waveforms: Motor         Sensory         H-Reflex        Microbiology No results  found for this or any previous visit (from the past 240 hour(s)).  Lab Basic Metabolic Panel: Recent Labs  Lab 02/27/23 2335 03/01/23 0825 03/01/23 1528  0137  1654  NA 146* 143 146* 145 146*  K 3.9 3.5 4.2 4.7 5.6*  CL 112* 108  --  112*  --   CO2 24 22  --  21*  --   GLUCOSE 159* 178*  --  139*  --   BUN 90* 94*  --  97*  --   CREATININE 2.32* 2.58*  --  2.91*  --   CALCIUM 8.2* 8.2*  --  8.3*  --   MG 3.3* 3.5*  --   --   --   PHOS 4.3 4.4  --   --   --    Liver Function Tests: Recent Labs  Lab 02/27/23 2335 03/01/23 0825  ALBUMIN 2.2* 2.2*   No results for input(s): "LIPASE", "AMYLASE" in the last 168 hours. No results for input(s): "AMMONIA" in the last 168 hours. CBC: Recent Labs  Lab 02/27/23 2335 03/01/23 0825 03/01/23 1114 03/01/23 1528  0137  1354  1654  WBC 17.8* 15.5* 14.8*  --  13.7* 11.5*  --   HGB 8.2* 8.1* 8.0* 7.5* 7.3* 6.5* 6.5*  HCT 27.5* 26.8* 26.9* 22.0* 23.8* 21.6* 19.0*  MCV 86.5 85.6 88.2  --  86.2 86.4  --   PLT 322  3.1 8.0  > 7.0       Bel elbow 8.1 - 7.1 -  Bel elbow-Wrist 26 50  > 50  Ab elbow 10.1 - 6.5 -  Ab elbow-Bel elbow 10 50 -  Sensory Sites   Neg Peak Lat Amplitude (O-P) Segment Distance Velocity Comment Site (ms) Norm (V) Norm  (cm) (ms)  Left Median Sensory Wrist-Dig II *NR  < 3.8 *NR  > 10 Wrist-Dig II 13   Left Radial Sensory Forearm-Wrist 2.2  < 2.8 *8  > 10 Forearm-Wrist 10   Left Superficial Fibular Sensory 14 cm-Ankle *NR  < 4.6 *NR  > 3 14 cm-Ankle 14   Left Sural Sensory Calf-Lat mall *NR  < 4.6 *NR  > 3 Calf-Lat mall 14   Left Ulnar Sensory Wrist-Dig V *NR  < 3.2 *NR  > 5 Wrist-Dig V 11   H-Reflex Results   M-Lat H Lat H Neg Amp H-M Lat Site (ms) (ms) Norm (mV) (ms) Left Tibial H-Reflex Pop fossa 10.0 ---  < 35.0 --- --- EMG+  Side Muscle Ins.Act Fibs Fasc Recrt Amp Dur Poly Activation Comment Left Tib ant Nml *1+ Nml *SMU *1+ *1+ *1+ Nml N/A Left Gastroc MH Nml Nml Nml Nml Nml Nml Nml Nml N/A Left FDL Nml Nml Nml *2- *1+ *1+ *1+ Nml N/A Left Fib longus Nml Nml Nml *1- *1+ *1+ *1+ Nml N/A Left Rectus fem Nml Nml Nml Nml Nml Nml Nml Nml N/A Left Biceps fem SH Nml Nml Nml Nml Nml Nml Nml Nml N/A Left Gluteus med Nml Nml Nml Nml Nml Nml Nml Nml N/A Left L5 PSP Nml Nml Nml Nml Nml Nml Nml Nml N/A Nerve Measurements  Site Area Mobility Vascularity Comment  mm Norm    Left Fibular Fib head 7.7  < 17.8     Pop fossa 9.2  < 20.9     Waveforms: Motor         Sensory         H-Reflex        Microbiology No results  found for this or any previous visit (from the past 240 hour(s)).  Lab Basic Metabolic Panel: Recent Labs  Lab 02/27/23 2335 03/01/23 0825 03/01/23 1528  0137  1654  NA 146* 143 146* 145 146*  K 3.9 3.5 4.2 4.7 5.6*  CL 112* 108  --  112*  --   CO2 24 22  --  21*  --   GLUCOSE 159* 178*  --  139*  --   BUN 90* 94*  --  97*  --   CREATININE 2.32* 2.58*  --  2.91*  --   CALCIUM 8.2* 8.2*  --  8.3*  --   MG 3.3* 3.5*  --   --   --   PHOS 4.3 4.4  --   --   --    Liver Function Tests: Recent Labs  Lab 02/27/23 2335 03/01/23 0825  ALBUMIN 2.2* 2.2*   No results for input(s): "LIPASE", "AMYLASE" in the last 168 hours. No results for input(s): "AMMONIA" in the last 168 hours. CBC: Recent Labs  Lab 02/27/23 2335 03/01/23 0825 03/01/23 1114 03/01/23 1528  0137  1354  1654  WBC 17.8* 15.5* 14.8*  --  13.7* 11.5*  --   HGB 8.2* 8.1* 8.0* 7.5* 7.3* 6.5* 6.5*  HCT 27.5* 26.8* 26.9* 22.0* 23.8* 21.6* 19.0*  MCV 86.5 85.6 88.2  --  86.2 86.4  --   PLT 322  3.1 8.0  > 7.0       Bel elbow 8.1 - 7.1 -  Bel elbow-Wrist 26 50  > 50  Ab elbow 10.1 - 6.5 -  Ab elbow-Bel elbow 10 50 -  Sensory Sites   Neg Peak Lat Amplitude (O-P) Segment Distance Velocity Comment Site (ms) Norm (V) Norm  (cm) (ms)  Left Median Sensory Wrist-Dig II *NR  < 3.8 *NR  > 10 Wrist-Dig II 13   Left Radial Sensory Forearm-Wrist 2.2  < 2.8 *8  > 10 Forearm-Wrist 10   Left Superficial Fibular Sensory 14 cm-Ankle *NR  < 4.6 *NR  > 3 14 cm-Ankle 14   Left Sural Sensory Calf-Lat mall *NR  < 4.6 *NR  > 3 Calf-Lat mall 14   Left Ulnar Sensory Wrist-Dig V *NR  < 3.2 *NR  > 5 Wrist-Dig V 11   H-Reflex Results   M-Lat H Lat H Neg Amp H-M Lat Site (ms) (ms) Norm (mV) (ms) Left Tibial H-Reflex Pop fossa 10.0 ---  < 35.0 --- --- EMG+  Side Muscle Ins.Act Fibs Fasc Recrt Amp Dur Poly Activation Comment Left Tib ant Nml *1+ Nml *SMU *1+ *1+ *1+ Nml N/A Left Gastroc MH Nml Nml Nml Nml Nml Nml Nml Nml N/A Left FDL Nml Nml Nml *2- *1+ *1+ *1+ Nml N/A Left Fib longus Nml Nml Nml *1- *1+ *1+ *1+ Nml N/A Left Rectus fem Nml Nml Nml Nml Nml Nml Nml Nml N/A Left Biceps fem SH Nml Nml Nml Nml Nml Nml Nml Nml N/A Left Gluteus med Nml Nml Nml Nml Nml Nml Nml Nml N/A Left L5 PSP Nml Nml Nml Nml Nml Nml Nml Nml N/A Nerve Measurements  Site Area Mobility Vascularity Comment  mm Norm    Left Fibular Fib head 7.7  < 17.8     Pop fossa 9.2  < 20.9     Waveforms: Motor         Sensory         H-Reflex        Microbiology No results  found for this or any previous visit (from the past 240 hour(s)).  Lab Basic Metabolic Panel: Recent Labs  Lab 02/27/23 2335 03/01/23 0825 03/01/23 1528  0137  1654  NA 146* 143 146* 145 146*  K 3.9 3.5 4.2 4.7 5.6*  CL 112* 108  --  112*  --   CO2 24 22  --  21*  --   GLUCOSE 159* 178*  --  139*  --   BUN 90* 94*  --  97*  --   CREATININE 2.32* 2.58*  --  2.91*  --   CALCIUM 8.2* 8.2*  --  8.3*  --   MG 3.3* 3.5*  --   --   --   PHOS 4.3 4.4  --   --   --    Liver Function Tests: Recent Labs  Lab 02/27/23 2335 03/01/23 0825  ALBUMIN 2.2* 2.2*   No results for input(s): "LIPASE", "AMYLASE" in the last 168 hours. No results for input(s): "AMMONIA" in the last 168 hours. CBC: Recent Labs  Lab 02/27/23 2335 03/01/23 0825 03/01/23 1114 03/01/23 1528  0137  1354  1654  WBC 17.8* 15.5* 14.8*  --  13.7* 11.5*  --   HGB 8.2* 8.1* 8.0* 7.5* 7.3* 6.5* 6.5*  HCT 27.5* 26.8* 26.9* 22.0* 23.8* 21.6* 19.0*  MCV 86.5 85.6 88.2  --  86.2 86.4  --   PLT 322  Head CT yesterday and earlier. FINDINGS: Brain: Regressed gyriform and sulcal hyperdensity in the right occipital lobe since last night. No definite intraventricular blood. No ventriculomegaly or midline shift. Basilar cisterns remain normal. Underlying right occipital cytotoxic edema or encephalomalacia. No evolving right MCA infarct is evident and elsewhere gray-white differentiation is stable. Vascular: Right MCA M1 region vascular stent redemonstrated. Less retained intravascular contrast at this time. Skull: Intact.  No acute osseous abnormality identified. Sinuses/Orbits: Stable paranasal sinuses, tympanic cavities and mastoids. Patient remains intubated on the scout view. Other: No acute orbit or scalp soft tissue finding. IMPRESSION: 1. Persistent but regressed right occipital hyperdensity since last night. Favor subarachnoid hemorrhage (Heidelberg classification 3c: Subarachnoid hemorrhage) over contrast staining. No intracranial mass effect. 2. Underlying right PCA territory cytotoxic edema or encephalomalacia. No evolving right MCA territory infarct is evident. 3. No new intracranial abnormality. Electronically Signed   By: Odessa Fleming M.D.   On: 02/18/2023 11:46   DG  CHEST PORT 1 VIEW  Result Date: 02/18/2023 CLINICAL DATA:  [id number] Acute respiratory failure with hypoxia (HCC) [id number] EXAM: PORTABLE CHEST 1 VIEW COMPARISON:  Chest radiograph from one day prior. FINDINGS: Endotracheal tube tip is 4.2 cm above the carina. Enteric tube terminates in the gastric fundus. Esophageal temperature probe terminates in the proximal stomach. Stable cardiomediastinal silhouette with normal heart size. No pneumothorax. No pleural effusion. New borderline mild pulmonary edema. Increased hazy bibasilar lung opacities. IMPRESSION: 1. Well-positioned support structures. 2. New borderline mild pulmonary edema. 3. Increased hazy bibasilar lung opacities, favor atelectasis. Electronically Signed   By: Delbert Phenix M.D.   On: 02/18/2023 08:08   CT HEAD WO CONTRAST ( )  Result Date: 03/07/2023 CLINICAL DATA:  Stroke follow-up EXAM: CT HEAD WITHOUT CONTRAST TECHNIQUE: Contiguous axial images were obtained from the base of the skull through the vertex without intravenous contrast. RADIATION DOSE REDUCTION: This exam was performed according to the departmental dose-optimization program which includes automated exposure control, adjustment of the mA and/or kV according to patient size and/or use of iterative reconstruction technique. COMPARISON:  Head CT 02/24/2023 FINDINGS: Brain: There is subarachnoid hyperdensity over the posterior right hemisphere, possibly contrast staining. Loss of gray-white differentiation in the posterior right MCA territory is unchanged. No midline shift or other mass effect. Vascular: There is now a stent in the right MCA. Skull: The visualized skull base, calvarium and extracranial soft tissues are normal. Sinuses/Orbits: Bilateral maxillary retention cysts. The orbits are normal. IMPRESSION: 1. 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 2. Hyperdensity over the posterior right hemisphere, possibly contrast staining. Attention on follow-up.  Electronically Signed   By: Deatra Robinson M.D.   On: 02/12/2023 22:11   CT ANGIO HEAD NECK W WO CM (CODE STROKE)  Result Date: 03/11/2023 CLINICAL DATA:  Neuro deficit, acute, stroke suspected EXAM: CT ANGIOGRAPHY HEAD AND NECK WITH AND WITHOUT CONTRAST TECHNIQUE: Multidetector CT imaging of the head and neck was performed using the standard protocol during bolus administration of intravenous contrast. Multiplanar CT image reconstructions and MIPs were obtained to evaluate the vascular anatomy. Carotid stenosis measurements (when applicable) are obtained utilizing NASCET criteria, using the distal internal carotid diameter as the denominator. RADIATION DOSE REDUCTION: This exam was performed according to the departmental dose-optimization program which includes automated exposure control, adjustment of the mA and/or kV according to patient size and/or use of iterative reconstruction technique. CONTRAST:  75mL OMNIPAQUE IOHEXOL 350 MG/ML SOLN COMPARISON:  Same day CT head FINDINGS: CT HEAD FINDINGS See same day CT  3.1 8.0  > 7.0       Bel elbow 8.1 - 7.1 -  Bel elbow-Wrist 26 50  > 50  Ab elbow 10.1 - 6.5 -  Ab elbow-Bel elbow 10 50 -  Sensory Sites   Neg Peak Lat Amplitude (O-P) Segment Distance Velocity Comment Site (ms) Norm (V) Norm  (cm) (ms)  Left Median Sensory Wrist-Dig II *NR  < 3.8 *NR  > 10 Wrist-Dig II 13   Left Radial Sensory Forearm-Wrist 2.2  < 2.8 *8  > 10 Forearm-Wrist 10   Left Superficial Fibular Sensory 14 cm-Ankle *NR  < 4.6 *NR  > 3 14 cm-Ankle 14   Left Sural Sensory Calf-Lat mall *NR  < 4.6 *NR  > 3 Calf-Lat mall 14   Left Ulnar Sensory Wrist-Dig V *NR  < 3.2 *NR  > 5 Wrist-Dig V 11   H-Reflex Results   M-Lat H Lat H Neg Amp H-M Lat Site (ms) (ms) Norm (mV) (ms) Left Tibial H-Reflex Pop fossa 10.0 ---  < 35.0 --- --- EMG+  Side Muscle Ins.Act Fibs Fasc Recrt Amp Dur Poly Activation Comment Left Tib ant Nml *1+ Nml *SMU *1+ *1+ *1+ Nml N/A Left Gastroc MH Nml Nml Nml Nml Nml Nml Nml Nml N/A Left FDL Nml Nml Nml *2- *1+ *1+ *1+ Nml N/A Left Fib longus Nml Nml Nml *1- *1+ *1+ *1+ Nml N/A Left Rectus fem Nml Nml Nml Nml Nml Nml Nml Nml N/A Left Biceps fem SH Nml Nml Nml Nml Nml Nml Nml Nml N/A Left Gluteus med Nml Nml Nml Nml Nml Nml Nml Nml N/A Left L5 PSP Nml Nml Nml Nml Nml Nml Nml Nml N/A Nerve Measurements  Site Area Mobility Vascularity Comment  mm Norm    Left Fibular Fib head 7.7  < 17.8     Pop fossa 9.2  < 20.9     Waveforms: Motor         Sensory         H-Reflex        Microbiology No results  found for this or any previous visit (from the past 240 hour(s)).  Lab Basic Metabolic Panel: Recent Labs  Lab 02/27/23 2335 03/01/23 0825 03/01/23 1528  0137  1654  NA 146* 143 146* 145 146*  K 3.9 3.5 4.2 4.7 5.6*  CL 112* 108  --  112*  --   CO2 24 22  --  21*  --   GLUCOSE 159* 178*  --  139*  --   BUN 90* 94*  --  97*  --   CREATININE 2.32* 2.58*  --  2.91*  --   CALCIUM 8.2* 8.2*  --  8.3*  --   MG 3.3* 3.5*  --   --   --   PHOS 4.3 4.4  --   --   --    Liver Function Tests: Recent Labs  Lab 02/27/23 2335 03/01/23 0825  ALBUMIN 2.2* 2.2*   No results for input(s): "LIPASE", "AMYLASE" in the last 168 hours. No results for input(s): "AMMONIA" in the last 168 hours. CBC: Recent Labs  Lab 02/27/23 2335 03/01/23 0825 03/01/23 1114 03/01/23 1528  0137  1354  1654  WBC 17.8* 15.5* 14.8*  --  13.7* 11.5*  --   HGB 8.2* 8.1* 8.0* 7.5* 7.3* 6.5* 6.5*  HCT 27.5* 26.8* 26.9* 22.0* 23.8* 21.6* 19.0*  MCV 86.5 85.6 88.2  --  86.2 86.4  --   PLT 322  3.1 8.0  > 7.0       Bel elbow 8.1 - 7.1 -  Bel elbow-Wrist 26 50  > 50  Ab elbow 10.1 - 6.5 -  Ab elbow-Bel elbow 10 50 -  Sensory Sites   Neg Peak Lat Amplitude (O-P) Segment Distance Velocity Comment Site (ms) Norm (V) Norm  (cm) (ms)  Left Median Sensory Wrist-Dig II *NR  < 3.8 *NR  > 10 Wrist-Dig II 13   Left Radial Sensory Forearm-Wrist 2.2  < 2.8 *8  > 10 Forearm-Wrist 10   Left Superficial Fibular Sensory 14 cm-Ankle *NR  < 4.6 *NR  > 3 14 cm-Ankle 14   Left Sural Sensory Calf-Lat mall *NR  < 4.6 *NR  > 3 Calf-Lat mall 14   Left Ulnar Sensory Wrist-Dig V *NR  < 3.2 *NR  > 5 Wrist-Dig V 11   H-Reflex Results   M-Lat H Lat H Neg Amp H-M Lat Site (ms) (ms) Norm (mV) (ms) Left Tibial H-Reflex Pop fossa 10.0 ---  < 35.0 --- --- EMG+  Side Muscle Ins.Act Fibs Fasc Recrt Amp Dur Poly Activation Comment Left Tib ant Nml *1+ Nml *SMU *1+ *1+ *1+ Nml N/A Left Gastroc MH Nml Nml Nml Nml Nml Nml Nml Nml N/A Left FDL Nml Nml Nml *2- *1+ *1+ *1+ Nml N/A Left Fib longus Nml Nml Nml *1- *1+ *1+ *1+ Nml N/A Left Rectus fem Nml Nml Nml Nml Nml Nml Nml Nml N/A Left Biceps fem SH Nml Nml Nml Nml Nml Nml Nml Nml N/A Left Gluteus med Nml Nml Nml Nml Nml Nml Nml Nml N/A Left L5 PSP Nml Nml Nml Nml Nml Nml Nml Nml N/A Nerve Measurements  Site Area Mobility Vascularity Comment  mm Norm    Left Fibular Fib head 7.7  < 17.8     Pop fossa 9.2  < 20.9     Waveforms: Motor         Sensory         H-Reflex        Microbiology No results  found for this or any previous visit (from the past 240 hour(s)).  Lab Basic Metabolic Panel: Recent Labs  Lab 02/27/23 2335 03/01/23 0825 03/01/23 1528  0137  1654  NA 146* 143 146* 145 146*  K 3.9 3.5 4.2 4.7 5.6*  CL 112* 108  --  112*  --   CO2 24 22  --  21*  --   GLUCOSE 159* 178*  --  139*  --   BUN 90* 94*  --  97*  --   CREATININE 2.32* 2.58*  --  2.91*  --   CALCIUM 8.2* 8.2*  --  8.3*  --   MG 3.3* 3.5*  --   --   --   PHOS 4.3 4.4  --   --   --    Liver Function Tests: Recent Labs  Lab 02/27/23 2335 03/01/23 0825  ALBUMIN 2.2* 2.2*   No results for input(s): "LIPASE", "AMYLASE" in the last 168 hours. No results for input(s): "AMMONIA" in the last 168 hours. CBC: Recent Labs  Lab 02/27/23 2335 03/01/23 0825 03/01/23 1114 03/01/23 1528  0137  1354  1654  WBC 17.8* 15.5* 14.8*  --  13.7* 11.5*  --   HGB 8.2* 8.1* 8.0* 7.5* 7.3* 6.5* 6.5*  HCT 27.5* 26.8* 26.9* 22.0* 23.8* 21.6* 19.0*  MCV 86.5 85.6 88.2  --  86.2 86.4  --   PLT 322  Head CT yesterday and earlier. FINDINGS: Brain: Regressed gyriform and sulcal hyperdensity in the right occipital lobe since last night. No definite intraventricular blood. No ventriculomegaly or midline shift. Basilar cisterns remain normal. Underlying right occipital cytotoxic edema or encephalomalacia. No evolving right MCA infarct is evident and elsewhere gray-white differentiation is stable. Vascular: Right MCA M1 region vascular stent redemonstrated. Less retained intravascular contrast at this time. Skull: Intact.  No acute osseous abnormality identified. Sinuses/Orbits: Stable paranasal sinuses, tympanic cavities and mastoids. Patient remains intubated on the scout view. Other: No acute orbit or scalp soft tissue finding. IMPRESSION: 1. Persistent but regressed right occipital hyperdensity since last night. Favor subarachnoid hemorrhage (Heidelberg classification 3c: Subarachnoid hemorrhage) over contrast staining. No intracranial mass effect. 2. Underlying right PCA territory cytotoxic edema or encephalomalacia. No evolving right MCA territory infarct is evident. 3. No new intracranial abnormality. Electronically Signed   By: Odessa Fleming M.D.   On: 02/18/2023 11:46   DG  CHEST PORT 1 VIEW  Result Date: 02/18/2023 CLINICAL DATA:  [id number] Acute respiratory failure with hypoxia (HCC) [id number] EXAM: PORTABLE CHEST 1 VIEW COMPARISON:  Chest radiograph from one day prior. FINDINGS: Endotracheal tube tip is 4.2 cm above the carina. Enteric tube terminates in the gastric fundus. Esophageal temperature probe terminates in the proximal stomach. Stable cardiomediastinal silhouette with normal heart size. No pneumothorax. No pleural effusion. New borderline mild pulmonary edema. Increased hazy bibasilar lung opacities. IMPRESSION: 1. Well-positioned support structures. 2. New borderline mild pulmonary edema. 3. Increased hazy bibasilar lung opacities, favor atelectasis. Electronically Signed   By: Delbert Phenix M.D.   On: 02/18/2023 08:08   CT HEAD WO CONTRAST ( )  Result Date: 03/07/2023 CLINICAL DATA:  Stroke follow-up EXAM: CT HEAD WITHOUT CONTRAST TECHNIQUE: Contiguous axial images were obtained from the base of the skull through the vertex without intravenous contrast. RADIATION DOSE REDUCTION: This exam was performed according to the departmental dose-optimization program which includes automated exposure control, adjustment of the mA and/or kV according to patient size and/or use of iterative reconstruction technique. COMPARISON:  Head CT 02/24/2023 FINDINGS: Brain: There is subarachnoid hyperdensity over the posterior right hemisphere, possibly contrast staining. Loss of gray-white differentiation in the posterior right MCA territory is unchanged. No midline shift or other mass effect. Vascular: There is now a stent in the right MCA. Skull: The visualized skull base, calvarium and extracranial soft tissues are normal. Sinuses/Orbits: Bilateral maxillary retention cysts. The orbits are normal. IMPRESSION: 1. 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 2. Hyperdensity over the posterior right hemisphere, possibly contrast staining. Attention on follow-up.  Electronically Signed   By: Deatra Robinson M.D.   On: 02/12/2023 22:11   CT ANGIO HEAD NECK W WO CM (CODE STROKE)  Result Date: 03/11/2023 CLINICAL DATA:  Neuro deficit, acute, stroke suspected EXAM: CT ANGIOGRAPHY HEAD AND NECK WITH AND WITHOUT CONTRAST TECHNIQUE: Multidetector CT imaging of the head and neck was performed using the standard protocol during bolus administration of intravenous contrast. Multiplanar CT image reconstructions and MIPs were obtained to evaluate the vascular anatomy. Carotid stenosis measurements (when applicable) are obtained utilizing NASCET criteria, using the distal internal carotid diameter as the denominator. RADIATION DOSE REDUCTION: This exam was performed according to the departmental dose-optimization program which includes automated exposure control, adjustment of the mA and/or kV according to patient size and/or use of iterative reconstruction technique. CONTRAST:  75mL OMNIPAQUE IOHEXOL 350 MG/ML SOLN COMPARISON:  Same day CT head FINDINGS: CT HEAD FINDINGS See same day CT  3.1 8.0  > 7.0       Bel elbow 8.1 - 7.1 -  Bel elbow-Wrist 26 50  > 50  Ab elbow 10.1 - 6.5 -  Ab elbow-Bel elbow 10 50 -  Sensory Sites   Neg Peak Lat Amplitude (O-P) Segment Distance Velocity Comment Site (ms) Norm (V) Norm  (cm) (ms)  Left Median Sensory Wrist-Dig II *NR  < 3.8 *NR  > 10 Wrist-Dig II 13   Left Radial Sensory Forearm-Wrist 2.2  < 2.8 *8  > 10 Forearm-Wrist 10   Left Superficial Fibular Sensory 14 cm-Ankle *NR  < 4.6 *NR  > 3 14 cm-Ankle 14   Left Sural Sensory Calf-Lat mall *NR  < 4.6 *NR  > 3 Calf-Lat mall 14   Left Ulnar Sensory Wrist-Dig V *NR  < 3.2 *NR  > 5 Wrist-Dig V 11   H-Reflex Results   M-Lat H Lat H Neg Amp H-M Lat Site (ms) (ms) Norm (mV) (ms) Left Tibial H-Reflex Pop fossa 10.0 ---  < 35.0 --- --- EMG+  Side Muscle Ins.Act Fibs Fasc Recrt Amp Dur Poly Activation Comment Left Tib ant Nml *1+ Nml *SMU *1+ *1+ *1+ Nml N/A Left Gastroc MH Nml Nml Nml Nml Nml Nml Nml Nml N/A Left FDL Nml Nml Nml *2- *1+ *1+ *1+ Nml N/A Left Fib longus Nml Nml Nml *1- *1+ *1+ *1+ Nml N/A Left Rectus fem Nml Nml Nml Nml Nml Nml Nml Nml N/A Left Biceps fem SH Nml Nml Nml Nml Nml Nml Nml Nml N/A Left Gluteus med Nml Nml Nml Nml Nml Nml Nml Nml N/A Left L5 PSP Nml Nml Nml Nml Nml Nml Nml Nml N/A Nerve Measurements  Site Area Mobility Vascularity Comment  mm Norm    Left Fibular Fib head 7.7  < 17.8     Pop fossa 9.2  < 20.9     Waveforms: Motor         Sensory         H-Reflex        Microbiology No results  found for this or any previous visit (from the past 240 hour(s)).  Lab Basic Metabolic Panel: Recent Labs  Lab 02/27/23 2335 03/01/23 0825 03/01/23 1528  0137  1654  NA 146* 143 146* 145 146*  K 3.9 3.5 4.2 4.7 5.6*  CL 112* 108  --  112*  --   CO2 24 22  --  21*  --   GLUCOSE 159* 178*  --  139*  --   BUN 90* 94*  --  97*  --   CREATININE 2.32* 2.58*  --  2.91*  --   CALCIUM 8.2* 8.2*  --  8.3*  --   MG 3.3* 3.5*  --   --   --   PHOS 4.3 4.4  --   --   --    Liver Function Tests: Recent Labs  Lab 02/27/23 2335 03/01/23 0825  ALBUMIN 2.2* 2.2*   No results for input(s): "LIPASE", "AMYLASE" in the last 168 hours. No results for input(s): "AMMONIA" in the last 168 hours. CBC: Recent Labs  Lab 02/27/23 2335 03/01/23 0825 03/01/23 1114 03/01/23 1528  0137  1354  1654  WBC 17.8* 15.5* 14.8*  --  13.7* 11.5*  --   HGB 8.2* 8.1* 8.0* 7.5* 7.3* 6.5* 6.5*  HCT 27.5* 26.8* 26.9* 22.0* 23.8* 21.6* 19.0*  MCV 86.5 85.6 88.2  --  86.2 86.4  --   PLT 322  3.1 8.0  > 7.0       Bel elbow 8.1 - 7.1 -  Bel elbow-Wrist 26 50  > 50  Ab elbow 10.1 - 6.5 -  Ab elbow-Bel elbow 10 50 -  Sensory Sites   Neg Peak Lat Amplitude (O-P) Segment Distance Velocity Comment Site (ms) Norm (V) Norm  (cm) (ms)  Left Median Sensory Wrist-Dig II *NR  < 3.8 *NR  > 10 Wrist-Dig II 13   Left Radial Sensory Forearm-Wrist 2.2  < 2.8 *8  > 10 Forearm-Wrist 10   Left Superficial Fibular Sensory 14 cm-Ankle *NR  < 4.6 *NR  > 3 14 cm-Ankle 14   Left Sural Sensory Calf-Lat mall *NR  < 4.6 *NR  > 3 Calf-Lat mall 14   Left Ulnar Sensory Wrist-Dig V *NR  < 3.2 *NR  > 5 Wrist-Dig V 11   H-Reflex Results   M-Lat H Lat H Neg Amp H-M Lat Site (ms) (ms) Norm (mV) (ms) Left Tibial H-Reflex Pop fossa 10.0 ---  < 35.0 --- --- EMG+  Side Muscle Ins.Act Fibs Fasc Recrt Amp Dur Poly Activation Comment Left Tib ant Nml *1+ Nml *SMU *1+ *1+ *1+ Nml N/A Left Gastroc MH Nml Nml Nml Nml Nml Nml Nml Nml N/A Left FDL Nml Nml Nml *2- *1+ *1+ *1+ Nml N/A Left Fib longus Nml Nml Nml *1- *1+ *1+ *1+ Nml N/A Left Rectus fem Nml Nml Nml Nml Nml Nml Nml Nml N/A Left Biceps fem SH Nml Nml Nml Nml Nml Nml Nml Nml N/A Left Gluteus med Nml Nml Nml Nml Nml Nml Nml Nml N/A Left L5 PSP Nml Nml Nml Nml Nml Nml Nml Nml N/A Nerve Measurements  Site Area Mobility Vascularity Comment  mm Norm    Left Fibular Fib head 7.7  < 17.8     Pop fossa 9.2  < 20.9     Waveforms: Motor         Sensory         H-Reflex        Microbiology No results  found for this or any previous visit (from the past 240 hour(s)).  Lab Basic Metabolic Panel: Recent Labs  Lab 02/27/23 2335 03/01/23 0825 03/01/23 1528  0137  1654  NA 146* 143 146* 145 146*  K 3.9 3.5 4.2 4.7 5.6*  CL 112* 108  --  112*  --   CO2 24 22  --  21*  --   GLUCOSE 159* 178*  --  139*  --   BUN 90* 94*  --  97*  --   CREATININE 2.32* 2.58*  --  2.91*  --   CALCIUM 8.2* 8.2*  --  8.3*  --   MG 3.3* 3.5*  --   --   --   PHOS 4.3 4.4  --   --   --    Liver Function Tests: Recent Labs  Lab 02/27/23 2335 03/01/23 0825  ALBUMIN 2.2* 2.2*   No results for input(s): "LIPASE", "AMYLASE" in the last 168 hours. No results for input(s): "AMMONIA" in the last 168 hours. CBC: Recent Labs  Lab 02/27/23 2335 03/01/23 0825 03/01/23 1114 03/01/23 1528  0137  1354  1654  WBC 17.8* 15.5* 14.8*  --  13.7* 11.5*  --   HGB 8.2* 8.1* 8.0* 7.5* 7.3* 6.5* 6.5*  HCT 27.5* 26.8* 26.9* 22.0* 23.8* 21.6* 19.0*  MCV 86.5 85.6 88.2  --  86.2 86.4  --   PLT 322  Head CT yesterday and earlier. FINDINGS: Brain: Regressed gyriform and sulcal hyperdensity in the right occipital lobe since last night. No definite intraventricular blood. No ventriculomegaly or midline shift. Basilar cisterns remain normal. Underlying right occipital cytotoxic edema or encephalomalacia. No evolving right MCA infarct is evident and elsewhere gray-white differentiation is stable. Vascular: Right MCA M1 region vascular stent redemonstrated. Less retained intravascular contrast at this time. Skull: Intact.  No acute osseous abnormality identified. Sinuses/Orbits: Stable paranasal sinuses, tympanic cavities and mastoids. Patient remains intubated on the scout view. Other: No acute orbit or scalp soft tissue finding. IMPRESSION: 1. Persistent but regressed right occipital hyperdensity since last night. Favor subarachnoid hemorrhage (Heidelberg classification 3c: Subarachnoid hemorrhage) over contrast staining. No intracranial mass effect. 2. Underlying right PCA territory cytotoxic edema or encephalomalacia. No evolving right MCA territory infarct is evident. 3. No new intracranial abnormality. Electronically Signed   By: Odessa Fleming M.D.   On: 02/18/2023 11:46   DG  CHEST PORT 1 VIEW  Result Date: 02/18/2023 CLINICAL DATA:  [id number] Acute respiratory failure with hypoxia (HCC) [id number] EXAM: PORTABLE CHEST 1 VIEW COMPARISON:  Chest radiograph from one day prior. FINDINGS: Endotracheal tube tip is 4.2 cm above the carina. Enteric tube terminates in the gastric fundus. Esophageal temperature probe terminates in the proximal stomach. Stable cardiomediastinal silhouette with normal heart size. No pneumothorax. No pleural effusion. New borderline mild pulmonary edema. Increased hazy bibasilar lung opacities. IMPRESSION: 1. Well-positioned support structures. 2. New borderline mild pulmonary edema. 3. Increased hazy bibasilar lung opacities, favor atelectasis. Electronically Signed   By: Delbert Phenix M.D.   On: 02/18/2023 08:08   CT HEAD WO CONTRAST ( )  Result Date: 03/07/2023 CLINICAL DATA:  Stroke follow-up EXAM: CT HEAD WITHOUT CONTRAST TECHNIQUE: Contiguous axial images were obtained from the base of the skull through the vertex without intravenous contrast. RADIATION DOSE REDUCTION: This exam was performed according to the departmental dose-optimization program which includes automated exposure control, adjustment of the mA and/or kV according to patient size and/or use of iterative reconstruction technique. COMPARISON:  Head CT 02/24/2023 FINDINGS: Brain: There is subarachnoid hyperdensity over the posterior right hemisphere, possibly contrast staining. Loss of gray-white differentiation in the posterior right MCA territory is unchanged. No midline shift or other mass effect. Vascular: There is now a stent in the right MCA. Skull: The visualized skull base, calvarium and extracranial soft tissues are normal. Sinuses/Orbits: Bilateral maxillary retention cysts. The orbits are normal. IMPRESSION: 1. 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 2. Hyperdensity over the posterior right hemisphere, possibly contrast staining. Attention on follow-up.  Electronically Signed   By: Deatra Robinson M.D.   On: 02/12/2023 22:11   CT ANGIO HEAD NECK W WO CM (CODE STROKE)  Result Date: 03/11/2023 CLINICAL DATA:  Neuro deficit, acute, stroke suspected EXAM: CT ANGIOGRAPHY HEAD AND NECK WITH AND WITHOUT CONTRAST TECHNIQUE: Multidetector CT imaging of the head and neck was performed using the standard protocol during bolus administration of intravenous contrast. Multiplanar CT image reconstructions and MIPs were obtained to evaluate the vascular anatomy. Carotid stenosis measurements (when applicable) are obtained utilizing NASCET criteria, using the distal internal carotid diameter as the denominator. RADIATION DOSE REDUCTION: This exam was performed according to the departmental dose-optimization program which includes automated exposure control, adjustment of the mA and/or kV according to patient size and/or use of iterative reconstruction technique. CONTRAST:  75mL OMNIPAQUE IOHEXOL 350 MG/ML SOLN COMPARISON:  Same day CT head FINDINGS: CT HEAD FINDINGS See same day CT  Head CT yesterday and earlier. FINDINGS: Brain: Regressed gyriform and sulcal hyperdensity in the right occipital lobe since last night. No definite intraventricular blood. No ventriculomegaly or midline shift. Basilar cisterns remain normal. Underlying right occipital cytotoxic edema or encephalomalacia. No evolving right MCA infarct is evident and elsewhere gray-white differentiation is stable. Vascular: Right MCA M1 region vascular stent redemonstrated. Less retained intravascular contrast at this time. Skull: Intact.  No acute osseous abnormality identified. Sinuses/Orbits: Stable paranasal sinuses, tympanic cavities and mastoids. Patient remains intubated on the scout view. Other: No acute orbit or scalp soft tissue finding. IMPRESSION: 1. Persistent but regressed right occipital hyperdensity since last night. Favor subarachnoid hemorrhage (Heidelberg classification 3c: Subarachnoid hemorrhage) over contrast staining. No intracranial mass effect. 2. Underlying right PCA territory cytotoxic edema or encephalomalacia. No evolving right MCA territory infarct is evident. 3. No new intracranial abnormality. Electronically Signed   By: Odessa Fleming M.D.   On: 02/18/2023 11:46   DG  CHEST PORT 1 VIEW  Result Date: 02/18/2023 CLINICAL DATA:  [id number] Acute respiratory failure with hypoxia (HCC) [id number] EXAM: PORTABLE CHEST 1 VIEW COMPARISON:  Chest radiograph from one day prior. FINDINGS: Endotracheal tube tip is 4.2 cm above the carina. Enteric tube terminates in the gastric fundus. Esophageal temperature probe terminates in the proximal stomach. Stable cardiomediastinal silhouette with normal heart size. No pneumothorax. No pleural effusion. New borderline mild pulmonary edema. Increased hazy bibasilar lung opacities. IMPRESSION: 1. Well-positioned support structures. 2. New borderline mild pulmonary edema. 3. Increased hazy bibasilar lung opacities, favor atelectasis. Electronically Signed   By: Delbert Phenix M.D.   On: 02/18/2023 08:08   CT HEAD WO CONTRAST ( )  Result Date: 03/07/2023 CLINICAL DATA:  Stroke follow-up EXAM: CT HEAD WITHOUT CONTRAST TECHNIQUE: Contiguous axial images were obtained from the base of the skull through the vertex without intravenous contrast. RADIATION DOSE REDUCTION: This exam was performed according to the departmental dose-optimization program which includes automated exposure control, adjustment of the mA and/or kV according to patient size and/or use of iterative reconstruction technique. COMPARISON:  Head CT 02/24/2023 FINDINGS: Brain: There is subarachnoid hyperdensity over the posterior right hemisphere, possibly contrast staining. Loss of gray-white differentiation in the posterior right MCA territory is unchanged. No midline shift or other mass effect. Vascular: There is now a stent in the right MCA. Skull: The visualized skull base, calvarium and extracranial soft tissues are normal. Sinuses/Orbits: Bilateral maxillary retention cysts. The orbits are normal. IMPRESSION: 1. 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 2. Hyperdensity over the posterior right hemisphere, possibly contrast staining. Attention on follow-up.  Electronically Signed   By: Deatra Robinson M.D.   On: 02/12/2023 22:11   CT ANGIO HEAD NECK W WO CM (CODE STROKE)  Result Date: 03/11/2023 CLINICAL DATA:  Neuro deficit, acute, stroke suspected EXAM: CT ANGIOGRAPHY HEAD AND NECK WITH AND WITHOUT CONTRAST TECHNIQUE: Multidetector CT imaging of the head and neck was performed using the standard protocol during bolus administration of intravenous contrast. Multiplanar CT image reconstructions and MIPs were obtained to evaluate the vascular anatomy. Carotid stenosis measurements (when applicable) are obtained utilizing NASCET criteria, using the distal internal carotid diameter as the denominator. RADIATION DOSE REDUCTION: This exam was performed according to the departmental dose-optimization program which includes automated exposure control, adjustment of the mA and/or kV according to patient size and/or use of iterative reconstruction technique. CONTRAST:  75mL OMNIPAQUE IOHEXOL 350 MG/ML SOLN COMPARISON:  Same day CT head FINDINGS: CT HEAD FINDINGS See same day CT  Head CT yesterday and earlier. FINDINGS: Brain: Regressed gyriform and sulcal hyperdensity in the right occipital lobe since last night. No definite intraventricular blood. No ventriculomegaly or midline shift. Basilar cisterns remain normal. Underlying right occipital cytotoxic edema or encephalomalacia. No evolving right MCA infarct is evident and elsewhere gray-white differentiation is stable. Vascular: Right MCA M1 region vascular stent redemonstrated. Less retained intravascular contrast at this time. Skull: Intact.  No acute osseous abnormality identified. Sinuses/Orbits: Stable paranasal sinuses, tympanic cavities and mastoids. Patient remains intubated on the scout view. Other: No acute orbit or scalp soft tissue finding. IMPRESSION: 1. Persistent but regressed right occipital hyperdensity since last night. Favor subarachnoid hemorrhage (Heidelberg classification 3c: Subarachnoid hemorrhage) over contrast staining. No intracranial mass effect. 2. Underlying right PCA territory cytotoxic edema or encephalomalacia. No evolving right MCA territory infarct is evident. 3. No new intracranial abnormality. Electronically Signed   By: Odessa Fleming M.D.   On: 02/18/2023 11:46   DG  CHEST PORT 1 VIEW  Result Date: 02/18/2023 CLINICAL DATA:  [id number] Acute respiratory failure with hypoxia (HCC) [id number] EXAM: PORTABLE CHEST 1 VIEW COMPARISON:  Chest radiograph from one day prior. FINDINGS: Endotracheal tube tip is 4.2 cm above the carina. Enteric tube terminates in the gastric fundus. Esophageal temperature probe terminates in the proximal stomach. Stable cardiomediastinal silhouette with normal heart size. No pneumothorax. No pleural effusion. New borderline mild pulmonary edema. Increased hazy bibasilar lung opacities. IMPRESSION: 1. Well-positioned support structures. 2. New borderline mild pulmonary edema. 3. Increased hazy bibasilar lung opacities, favor atelectasis. Electronically Signed   By: Delbert Phenix M.D.   On: 02/18/2023 08:08   CT HEAD WO CONTRAST ( )  Result Date: 03/07/2023 CLINICAL DATA:  Stroke follow-up EXAM: CT HEAD WITHOUT CONTRAST TECHNIQUE: Contiguous axial images were obtained from the base of the skull through the vertex without intravenous contrast. RADIATION DOSE REDUCTION: This exam was performed according to the departmental dose-optimization program which includes automated exposure control, adjustment of the mA and/or kV according to patient size and/or use of iterative reconstruction technique. COMPARISON:  Head CT 02/24/2023 FINDINGS: Brain: There is subarachnoid hyperdensity over the posterior right hemisphere, possibly contrast staining. Loss of gray-white differentiation in the posterior right MCA territory is unchanged. No midline shift or other mass effect. Vascular: There is now a stent in the right MCA. Skull: The visualized skull base, calvarium and extracranial soft tissues are normal. Sinuses/Orbits: Bilateral maxillary retention cysts. The orbits are normal. IMPRESSION: 1. 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 2. Hyperdensity over the posterior right hemisphere, possibly contrast staining. Attention on follow-up.  Electronically Signed   By: Deatra Robinson M.D.   On: 02/12/2023 22:11   CT ANGIO HEAD NECK W WO CM (CODE STROKE)  Result Date: 03/11/2023 CLINICAL DATA:  Neuro deficit, acute, stroke suspected EXAM: CT ANGIOGRAPHY HEAD AND NECK WITH AND WITHOUT CONTRAST TECHNIQUE: Multidetector CT imaging of the head and neck was performed using the standard protocol during bolus administration of intravenous contrast. Multiplanar CT image reconstructions and MIPs were obtained to evaluate the vascular anatomy. Carotid stenosis measurements (when applicable) are obtained utilizing NASCET criteria, using the distal internal carotid diameter as the denominator. RADIATION DOSE REDUCTION: This exam was performed according to the departmental dose-optimization program which includes automated exposure control, adjustment of the mA and/or kV according to patient size and/or use of iterative reconstruction technique. CONTRAST:  75mL OMNIPAQUE IOHEXOL 350 MG/ML SOLN COMPARISON:  Same day CT head FINDINGS: CT HEAD FINDINGS See same day CT  Head CT yesterday and earlier. FINDINGS: Brain: Regressed gyriform and sulcal hyperdensity in the right occipital lobe since last night. No definite intraventricular blood. No ventriculomegaly or midline shift. Basilar cisterns remain normal. Underlying right occipital cytotoxic edema or encephalomalacia. No evolving right MCA infarct is evident and elsewhere gray-white differentiation is stable. Vascular: Right MCA M1 region vascular stent redemonstrated. Less retained intravascular contrast at this time. Skull: Intact.  No acute osseous abnormality identified. Sinuses/Orbits: Stable paranasal sinuses, tympanic cavities and mastoids. Patient remains intubated on the scout view. Other: No acute orbit or scalp soft tissue finding. IMPRESSION: 1. Persistent but regressed right occipital hyperdensity since last night. Favor subarachnoid hemorrhage (Heidelberg classification 3c: Subarachnoid hemorrhage) over contrast staining. No intracranial mass effect. 2. Underlying right PCA territory cytotoxic edema or encephalomalacia. No evolving right MCA territory infarct is evident. 3. No new intracranial abnormality. Electronically Signed   By: Odessa Fleming M.D.   On: 02/18/2023 11:46   DG  CHEST PORT 1 VIEW  Result Date: 02/18/2023 CLINICAL DATA:  [id number] Acute respiratory failure with hypoxia (HCC) [id number] EXAM: PORTABLE CHEST 1 VIEW COMPARISON:  Chest radiograph from one day prior. FINDINGS: Endotracheal tube tip is 4.2 cm above the carina. Enteric tube terminates in the gastric fundus. Esophageal temperature probe terminates in the proximal stomach. Stable cardiomediastinal silhouette with normal heart size. No pneumothorax. No pleural effusion. New borderline mild pulmonary edema. Increased hazy bibasilar lung opacities. IMPRESSION: 1. Well-positioned support structures. 2. New borderline mild pulmonary edema. 3. Increased hazy bibasilar lung opacities, favor atelectasis. Electronically Signed   By: Delbert Phenix M.D.   On: 02/18/2023 08:08   CT HEAD WO CONTRAST ( )  Result Date: 03/07/2023 CLINICAL DATA:  Stroke follow-up EXAM: CT HEAD WITHOUT CONTRAST TECHNIQUE: Contiguous axial images were obtained from the base of the skull through the vertex without intravenous contrast. RADIATION DOSE REDUCTION: This exam was performed according to the departmental dose-optimization program which includes automated exposure control, adjustment of the mA and/or kV according to patient size and/or use of iterative reconstruction technique. COMPARISON:  Head CT 02/24/2023 FINDINGS: Brain: There is subarachnoid hyperdensity over the posterior right hemisphere, possibly contrast staining. Loss of gray-white differentiation in the posterior right MCA territory is unchanged. No midline shift or other mass effect. Vascular: There is now a stent in the right MCA. Skull: The visualized skull base, calvarium and extracranial soft tissues are normal. Sinuses/Orbits: Bilateral maxillary retention cysts. The orbits are normal. IMPRESSION: 1. 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 2. Hyperdensity over the posterior right hemisphere, possibly contrast staining. Attention on follow-up.  Electronically Signed   By: Deatra Robinson M.D.   On: 02/12/2023 22:11   CT ANGIO HEAD NECK W WO CM (CODE STROKE)  Result Date: 03/11/2023 CLINICAL DATA:  Neuro deficit, acute, stroke suspected EXAM: CT ANGIOGRAPHY HEAD AND NECK WITH AND WITHOUT CONTRAST TECHNIQUE: Multidetector CT imaging of the head and neck was performed using the standard protocol during bolus administration of intravenous contrast. Multiplanar CT image reconstructions and MIPs were obtained to evaluate the vascular anatomy. Carotid stenosis measurements (when applicable) are obtained utilizing NASCET criteria, using the distal internal carotid diameter as the denominator. RADIATION DOSE REDUCTION: This exam was performed according to the departmental dose-optimization program which includes automated exposure control, adjustment of the mA and/or kV according to patient size and/or use of iterative reconstruction technique. CONTRAST:  75mL OMNIPAQUE IOHEXOL 350 MG/ML SOLN COMPARISON:  Same day CT head FINDINGS: CT HEAD FINDINGS See same day CT  Head CT yesterday and earlier. FINDINGS: Brain: Regressed gyriform and sulcal hyperdensity in the right occipital lobe since last night. No definite intraventricular blood. No ventriculomegaly or midline shift. Basilar cisterns remain normal. Underlying right occipital cytotoxic edema or encephalomalacia. No evolving right MCA infarct is evident and elsewhere gray-white differentiation is stable. Vascular: Right MCA M1 region vascular stent redemonstrated. Less retained intravascular contrast at this time. Skull: Intact.  No acute osseous abnormality identified. Sinuses/Orbits: Stable paranasal sinuses, tympanic cavities and mastoids. Patient remains intubated on the scout view. Other: No acute orbit or scalp soft tissue finding. IMPRESSION: 1. Persistent but regressed right occipital hyperdensity since last night. Favor subarachnoid hemorrhage (Heidelberg classification 3c: Subarachnoid hemorrhage) over contrast staining. No intracranial mass effect. 2. Underlying right PCA territory cytotoxic edema or encephalomalacia. No evolving right MCA territory infarct is evident. 3. No new intracranial abnormality. Electronically Signed   By: Odessa Fleming M.D.   On: 02/18/2023 11:46   DG  CHEST PORT 1 VIEW  Result Date: 02/18/2023 CLINICAL DATA:  [id number] Acute respiratory failure with hypoxia (HCC) [id number] EXAM: PORTABLE CHEST 1 VIEW COMPARISON:  Chest radiograph from one day prior. FINDINGS: Endotracheal tube tip is 4.2 cm above the carina. Enteric tube terminates in the gastric fundus. Esophageal temperature probe terminates in the proximal stomach. Stable cardiomediastinal silhouette with normal heart size. No pneumothorax. No pleural effusion. New borderline mild pulmonary edema. Increased hazy bibasilar lung opacities. IMPRESSION: 1. Well-positioned support structures. 2. New borderline mild pulmonary edema. 3. Increased hazy bibasilar lung opacities, favor atelectasis. Electronically Signed   By: Delbert Phenix M.D.   On: 02/18/2023 08:08   CT HEAD WO CONTRAST ( )  Result Date: 03/07/2023 CLINICAL DATA:  Stroke follow-up EXAM: CT HEAD WITHOUT CONTRAST TECHNIQUE: Contiguous axial images were obtained from the base of the skull through the vertex without intravenous contrast. RADIATION DOSE REDUCTION: This exam was performed according to the departmental dose-optimization program which includes automated exposure control, adjustment of the mA and/or kV according to patient size and/or use of iterative reconstruction technique. COMPARISON:  Head CT 02/24/2023 FINDINGS: Brain: There is subarachnoid hyperdensity over the posterior right hemisphere, possibly contrast staining. Loss of gray-white differentiation in the posterior right MCA territory is unchanged. No midline shift or other mass effect. Vascular: There is now a stent in the right MCA. Skull: The visualized skull base, calvarium and extracranial soft tissues are normal. Sinuses/Orbits: Bilateral maxillary retention cysts. The orbits are normal. IMPRESSION: 1. 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 2. Hyperdensity over the posterior right hemisphere, possibly contrast staining. Attention on follow-up.  Electronically Signed   By: Deatra Robinson M.D.   On: 02/12/2023 22:11   CT ANGIO HEAD NECK W WO CM (CODE STROKE)  Result Date: 03/11/2023 CLINICAL DATA:  Neuro deficit, acute, stroke suspected EXAM: CT ANGIOGRAPHY HEAD AND NECK WITH AND WITHOUT CONTRAST TECHNIQUE: Multidetector CT imaging of the head and neck was performed using the standard protocol during bolus administration of intravenous contrast. Multiplanar CT image reconstructions and MIPs were obtained to evaluate the vascular anatomy. Carotid stenosis measurements (when applicable) are obtained utilizing NASCET criteria, using the distal internal carotid diameter as the denominator. RADIATION DOSE REDUCTION: This exam was performed according to the departmental dose-optimization program which includes automated exposure control, adjustment of the mA and/or kV according to patient size and/or use of iterative reconstruction technique. CONTRAST:  75mL OMNIPAQUE IOHEXOL 350 MG/ML SOLN COMPARISON:  Same day CT head FINDINGS: CT HEAD FINDINGS See same day CT  Head CT yesterday and earlier. FINDINGS: Brain: Regressed gyriform and sulcal hyperdensity in the right occipital lobe since last night. No definite intraventricular blood. No ventriculomegaly or midline shift. Basilar cisterns remain normal. Underlying right occipital cytotoxic edema or encephalomalacia. No evolving right MCA infarct is evident and elsewhere gray-white differentiation is stable. Vascular: Right MCA M1 region vascular stent redemonstrated. Less retained intravascular contrast at this time. Skull: Intact.  No acute osseous abnormality identified. Sinuses/Orbits: Stable paranasal sinuses, tympanic cavities and mastoids. Patient remains intubated on the scout view. Other: No acute orbit or scalp soft tissue finding. IMPRESSION: 1. Persistent but regressed right occipital hyperdensity since last night. Favor subarachnoid hemorrhage (Heidelberg classification 3c: Subarachnoid hemorrhage) over contrast staining. No intracranial mass effect. 2. Underlying right PCA territory cytotoxic edema or encephalomalacia. No evolving right MCA territory infarct is evident. 3. No new intracranial abnormality. Electronically Signed   By: Odessa Fleming M.D.   On: 02/18/2023 11:46   DG  CHEST PORT 1 VIEW  Result Date: 02/18/2023 CLINICAL DATA:  [id number] Acute respiratory failure with hypoxia (HCC) [id number] EXAM: PORTABLE CHEST 1 VIEW COMPARISON:  Chest radiograph from one day prior. FINDINGS: Endotracheal tube tip is 4.2 cm above the carina. Enteric tube terminates in the gastric fundus. Esophageal temperature probe terminates in the proximal stomach. Stable cardiomediastinal silhouette with normal heart size. No pneumothorax. No pleural effusion. New borderline mild pulmonary edema. Increased hazy bibasilar lung opacities. IMPRESSION: 1. Well-positioned support structures. 2. New borderline mild pulmonary edema. 3. Increased hazy bibasilar lung opacities, favor atelectasis. Electronically Signed   By: Delbert Phenix M.D.   On: 02/18/2023 08:08   CT HEAD WO CONTRAST ( )  Result Date: 03/07/2023 CLINICAL DATA:  Stroke follow-up EXAM: CT HEAD WITHOUT CONTRAST TECHNIQUE: Contiguous axial images were obtained from the base of the skull through the vertex without intravenous contrast. RADIATION DOSE REDUCTION: This exam was performed according to the departmental dose-optimization program which includes automated exposure control, adjustment of the mA and/or kV according to patient size and/or use of iterative reconstruction technique. COMPARISON:  Head CT 02/24/2023 FINDINGS: Brain: There is subarachnoid hyperdensity over the posterior right hemisphere, possibly contrast staining. Loss of gray-white differentiation in the posterior right MCA territory is unchanged. No midline shift or other mass effect. Vascular: There is now a stent in the right MCA. Skull: The visualized skull base, calvarium and extracranial soft tissues are normal. Sinuses/Orbits: Bilateral maxillary retention cysts. The orbits are normal. IMPRESSION: 1. 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 2. Hyperdensity over the posterior right hemisphere, possibly contrast staining. Attention on follow-up.  Electronically Signed   By: Deatra Robinson M.D.   On: 02/12/2023 22:11   CT ANGIO HEAD NECK W WO CM (CODE STROKE)  Result Date: 03/11/2023 CLINICAL DATA:  Neuro deficit, acute, stroke suspected EXAM: CT ANGIOGRAPHY HEAD AND NECK WITH AND WITHOUT CONTRAST TECHNIQUE: Multidetector CT imaging of the head and neck was performed using the standard protocol during bolus administration of intravenous contrast. Multiplanar CT image reconstructions and MIPs were obtained to evaluate the vascular anatomy. Carotid stenosis measurements (when applicable) are obtained utilizing NASCET criteria, using the distal internal carotid diameter as the denominator. RADIATION DOSE REDUCTION: This exam was performed according to the departmental dose-optimization program which includes automated exposure control, adjustment of the mA and/or kV according to patient size and/or use of iterative reconstruction technique. CONTRAST:  75mL OMNIPAQUE IOHEXOL 350 MG/ML SOLN COMPARISON:  Same day CT head FINDINGS: CT HEAD FINDINGS See same day CT  3.1 8.0  > 7.0       Bel elbow 8.1 - 7.1 -  Bel elbow-Wrist 26 50  > 50  Ab elbow 10.1 - 6.5 -  Ab elbow-Bel elbow 10 50 -  Sensory Sites   Neg Peak Lat Amplitude (O-P) Segment Distance Velocity Comment Site (ms) Norm (V) Norm  (cm) (ms)  Left Median Sensory Wrist-Dig II *NR  < 3.8 *NR  > 10 Wrist-Dig II 13   Left Radial Sensory Forearm-Wrist 2.2  < 2.8 *8  > 10 Forearm-Wrist 10   Left Superficial Fibular Sensory 14 cm-Ankle *NR  < 4.6 *NR  > 3 14 cm-Ankle 14   Left Sural Sensory Calf-Lat mall *NR  < 4.6 *NR  > 3 Calf-Lat mall 14   Left Ulnar Sensory Wrist-Dig V *NR  < 3.2 *NR  > 5 Wrist-Dig V 11   H-Reflex Results   M-Lat H Lat H Neg Amp H-M Lat Site (ms) (ms) Norm (mV) (ms) Left Tibial H-Reflex Pop fossa 10.0 ---  < 35.0 --- --- EMG+  Side Muscle Ins.Act Fibs Fasc Recrt Amp Dur Poly Activation Comment Left Tib ant Nml *1+ Nml *SMU *1+ *1+ *1+ Nml N/A Left Gastroc MH Nml Nml Nml Nml Nml Nml Nml Nml N/A Left FDL Nml Nml Nml *2- *1+ *1+ *1+ Nml N/A Left Fib longus Nml Nml Nml *1- *1+ *1+ *1+ Nml N/A Left Rectus fem Nml Nml Nml Nml Nml Nml Nml Nml N/A Left Biceps fem SH Nml Nml Nml Nml Nml Nml Nml Nml N/A Left Gluteus med Nml Nml Nml Nml Nml Nml Nml Nml N/A Left L5 PSP Nml Nml Nml Nml Nml Nml Nml Nml N/A Nerve Measurements  Site Area Mobility Vascularity Comment  mm Norm    Left Fibular Fib head 7.7  < 17.8     Pop fossa 9.2  < 20.9     Waveforms: Motor         Sensory         H-Reflex        Microbiology No results  found for this or any previous visit (from the past 240 hour(s)).  Lab Basic Metabolic Panel: Recent Labs  Lab 02/27/23 2335 03/01/23 0825 03/01/23 1528  0137  1654  NA 146* 143 146* 145 146*  K 3.9 3.5 4.2 4.7 5.6*  CL 112* 108  --  112*  --   CO2 24 22  --  21*  --   GLUCOSE 159* 178*  --  139*  --   BUN 90* 94*  --  97*  --   CREATININE 2.32* 2.58*  --  2.91*  --   CALCIUM 8.2* 8.2*  --  8.3*  --   MG 3.3* 3.5*  --   --   --   PHOS 4.3 4.4  --   --   --    Liver Function Tests: Recent Labs  Lab 02/27/23 2335 03/01/23 0825  ALBUMIN 2.2* 2.2*   No results for input(s): "LIPASE", "AMYLASE" in the last 168 hours. No results for input(s): "AMMONIA" in the last 168 hours. CBC: Recent Labs  Lab 02/27/23 2335 03/01/23 0825 03/01/23 1114 03/01/23 1528  0137  1354  1654  WBC 17.8* 15.5* 14.8*  --  13.7* 11.5*  --   HGB 8.2* 8.1* 8.0* 7.5* 7.3* 6.5* 6.5*  HCT 27.5* 26.8* 26.9* 22.0* 23.8* 21.6* 19.0*  MCV 86.5 85.6 88.2  --  86.2 86.4  --   PLT 322  Head CT yesterday and earlier. FINDINGS: Brain: Regressed gyriform and sulcal hyperdensity in the right occipital lobe since last night. No definite intraventricular blood. No ventriculomegaly or midline shift. Basilar cisterns remain normal. Underlying right occipital cytotoxic edema or encephalomalacia. No evolving right MCA infarct is evident and elsewhere gray-white differentiation is stable. Vascular: Right MCA M1 region vascular stent redemonstrated. Less retained intravascular contrast at this time. Skull: Intact.  No acute osseous abnormality identified. Sinuses/Orbits: Stable paranasal sinuses, tympanic cavities and mastoids. Patient remains intubated on the scout view. Other: No acute orbit or scalp soft tissue finding. IMPRESSION: 1. Persistent but regressed right occipital hyperdensity since last night. Favor subarachnoid hemorrhage (Heidelberg classification 3c: Subarachnoid hemorrhage) over contrast staining. No intracranial mass effect. 2. Underlying right PCA territory cytotoxic edema or encephalomalacia. No evolving right MCA territory infarct is evident. 3. No new intracranial abnormality. Electronically Signed   By: Odessa Fleming M.D.   On: 02/18/2023 11:46   DG  CHEST PORT 1 VIEW  Result Date: 02/18/2023 CLINICAL DATA:  [id number] Acute respiratory failure with hypoxia (HCC) [id number] EXAM: PORTABLE CHEST 1 VIEW COMPARISON:  Chest radiograph from one day prior. FINDINGS: Endotracheal tube tip is 4.2 cm above the carina. Enteric tube terminates in the gastric fundus. Esophageal temperature probe terminates in the proximal stomach. Stable cardiomediastinal silhouette with normal heart size. No pneumothorax. No pleural effusion. New borderline mild pulmonary edema. Increased hazy bibasilar lung opacities. IMPRESSION: 1. Well-positioned support structures. 2. New borderline mild pulmonary edema. 3. Increased hazy bibasilar lung opacities, favor atelectasis. Electronically Signed   By: Delbert Phenix M.D.   On: 02/18/2023 08:08   CT HEAD WO CONTRAST ( )  Result Date: 03/07/2023 CLINICAL DATA:  Stroke follow-up EXAM: CT HEAD WITHOUT CONTRAST TECHNIQUE: Contiguous axial images were obtained from the base of the skull through the vertex without intravenous contrast. RADIATION DOSE REDUCTION: This exam was performed according to the departmental dose-optimization program which includes automated exposure control, adjustment of the mA and/or kV according to patient size and/or use of iterative reconstruction technique. COMPARISON:  Head CT 02/24/2023 FINDINGS: Brain: There is subarachnoid hyperdensity over the posterior right hemisphere, possibly contrast staining. Loss of gray-white differentiation in the posterior right MCA territory is unchanged. No midline shift or other mass effect. Vascular: There is now a stent in the right MCA. Skull: The visualized skull base, calvarium and extracranial soft tissues are normal. Sinuses/Orbits: Bilateral maxillary retention cysts. The orbits are normal. IMPRESSION: 1. 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 2. Hyperdensity over the posterior right hemisphere, possibly contrast staining. Attention on follow-up.  Electronically Signed   By: Deatra Robinson M.D.   On: 02/12/2023 22:11   CT ANGIO HEAD NECK W WO CM (CODE STROKE)  Result Date: 03/11/2023 CLINICAL DATA:  Neuro deficit, acute, stroke suspected EXAM: CT ANGIOGRAPHY HEAD AND NECK WITH AND WITHOUT CONTRAST TECHNIQUE: Multidetector CT imaging of the head and neck was performed using the standard protocol during bolus administration of intravenous contrast. Multiplanar CT image reconstructions and MIPs were obtained to evaluate the vascular anatomy. Carotid stenosis measurements (when applicable) are obtained utilizing NASCET criteria, using the distal internal carotid diameter as the denominator. RADIATION DOSE REDUCTION: This exam was performed according to the departmental dose-optimization program which includes automated exposure control, adjustment of the mA and/or kV according to patient size and/or use of iterative reconstruction technique. CONTRAST:  75mL OMNIPAQUE IOHEXOL 350 MG/ML SOLN COMPARISON:  Same day CT head FINDINGS: CT HEAD FINDINGS See same day CT  Head CT yesterday and earlier. FINDINGS: Brain: Regressed gyriform and sulcal hyperdensity in the right occipital lobe since last night. No definite intraventricular blood. No ventriculomegaly or midline shift. Basilar cisterns remain normal. Underlying right occipital cytotoxic edema or encephalomalacia. No evolving right MCA infarct is evident and elsewhere gray-white differentiation is stable. Vascular: Right MCA M1 region vascular stent redemonstrated. Less retained intravascular contrast at this time. Skull: Intact.  No acute osseous abnormality identified. Sinuses/Orbits: Stable paranasal sinuses, tympanic cavities and mastoids. Patient remains intubated on the scout view. Other: No acute orbit or scalp soft tissue finding. IMPRESSION: 1. Persistent but regressed right occipital hyperdensity since last night. Favor subarachnoid hemorrhage (Heidelberg classification 3c: Subarachnoid hemorrhage) over contrast staining. No intracranial mass effect. 2. Underlying right PCA territory cytotoxic edema or encephalomalacia. No evolving right MCA territory infarct is evident. 3. No new intracranial abnormality. Electronically Signed   By: Odessa Fleming M.D.   On: 02/18/2023 11:46   DG  CHEST PORT 1 VIEW  Result Date: 02/18/2023 CLINICAL DATA:  [id number] Acute respiratory failure with hypoxia (HCC) [id number] EXAM: PORTABLE CHEST 1 VIEW COMPARISON:  Chest radiograph from one day prior. FINDINGS: Endotracheal tube tip is 4.2 cm above the carina. Enteric tube terminates in the gastric fundus. Esophageal temperature probe terminates in the proximal stomach. Stable cardiomediastinal silhouette with normal heart size. No pneumothorax. No pleural effusion. New borderline mild pulmonary edema. Increased hazy bibasilar lung opacities. IMPRESSION: 1. Well-positioned support structures. 2. New borderline mild pulmonary edema. 3. Increased hazy bibasilar lung opacities, favor atelectasis. Electronically Signed   By: Delbert Phenix M.D.   On: 02/18/2023 08:08   CT HEAD WO CONTRAST ( )  Result Date: 03/07/2023 CLINICAL DATA:  Stroke follow-up EXAM: CT HEAD WITHOUT CONTRAST TECHNIQUE: Contiguous axial images were obtained from the base of the skull through the vertex without intravenous contrast. RADIATION DOSE REDUCTION: This exam was performed according to the departmental dose-optimization program which includes automated exposure control, adjustment of the mA and/or kV according to patient size and/or use of iterative reconstruction technique. COMPARISON:  Head CT 02/24/2023 FINDINGS: Brain: There is subarachnoid hyperdensity over the posterior right hemisphere, possibly contrast staining. Loss of gray-white differentiation in the posterior right MCA territory is unchanged. No midline shift or other mass effect. Vascular: There is now a stent in the right MCA. Skull: The visualized skull base, calvarium and extracranial soft tissues are normal. Sinuses/Orbits: Bilateral maxillary retention cysts. The orbits are normal. IMPRESSION: 1. 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 2. Hyperdensity over the posterior right hemisphere, possibly contrast staining. Attention on follow-up.  Electronically Signed   By: Deatra Robinson M.D.   On: 02/12/2023 22:11   CT ANGIO HEAD NECK W WO CM (CODE STROKE)  Result Date: 03/11/2023 CLINICAL DATA:  Neuro deficit, acute, stroke suspected EXAM: CT ANGIOGRAPHY HEAD AND NECK WITH AND WITHOUT CONTRAST TECHNIQUE: Multidetector CT imaging of the head and neck was performed using the standard protocol during bolus administration of intravenous contrast. Multiplanar CT image reconstructions and MIPs were obtained to evaluate the vascular anatomy. Carotid stenosis measurements (when applicable) are obtained utilizing NASCET criteria, using the distal internal carotid diameter as the denominator. RADIATION DOSE REDUCTION: This exam was performed according to the departmental dose-optimization program which includes automated exposure control, adjustment of the mA and/or kV according to patient size and/or use of iterative reconstruction technique. CONTRAST:  75mL OMNIPAQUE IOHEXOL 350 MG/ML SOLN COMPARISON:  Same day CT head FINDINGS: CT HEAD FINDINGS See same day CT  Head CT yesterday and earlier. FINDINGS: Brain: Regressed gyriform and sulcal hyperdensity in the right occipital lobe since last night. No definite intraventricular blood. No ventriculomegaly or midline shift. Basilar cisterns remain normal. Underlying right occipital cytotoxic edema or encephalomalacia. No evolving right MCA infarct is evident and elsewhere gray-white differentiation is stable. Vascular: Right MCA M1 region vascular stent redemonstrated. Less retained intravascular contrast at this time. Skull: Intact.  No acute osseous abnormality identified. Sinuses/Orbits: Stable paranasal sinuses, tympanic cavities and mastoids. Patient remains intubated on the scout view. Other: No acute orbit or scalp soft tissue finding. IMPRESSION: 1. Persistent but regressed right occipital hyperdensity since last night. Favor subarachnoid hemorrhage (Heidelberg classification 3c: Subarachnoid hemorrhage) over contrast staining. No intracranial mass effect. 2. Underlying right PCA territory cytotoxic edema or encephalomalacia. No evolving right MCA territory infarct is evident. 3. No new intracranial abnormality. Electronically Signed   By: Odessa Fleming M.D.   On: 02/18/2023 11:46   DG  CHEST PORT 1 VIEW  Result Date: 02/18/2023 CLINICAL DATA:  [id number] Acute respiratory failure with hypoxia (HCC) [id number] EXAM: PORTABLE CHEST 1 VIEW COMPARISON:  Chest radiograph from one day prior. FINDINGS: Endotracheal tube tip is 4.2 cm above the carina. Enteric tube terminates in the gastric fundus. Esophageal temperature probe terminates in the proximal stomach. Stable cardiomediastinal silhouette with normal heart size. No pneumothorax. No pleural effusion. New borderline mild pulmonary edema. Increased hazy bibasilar lung opacities. IMPRESSION: 1. Well-positioned support structures. 2. New borderline mild pulmonary edema. 3. Increased hazy bibasilar lung opacities, favor atelectasis. Electronically Signed   By: Delbert Phenix M.D.   On: 02/18/2023 08:08   CT HEAD WO CONTRAST ( )  Result Date: 03/07/2023 CLINICAL DATA:  Stroke follow-up EXAM: CT HEAD WITHOUT CONTRAST TECHNIQUE: Contiguous axial images were obtained from the base of the skull through the vertex without intravenous contrast. RADIATION DOSE REDUCTION: This exam was performed according to the departmental dose-optimization program which includes automated exposure control, adjustment of the mA and/or kV according to patient size and/or use of iterative reconstruction technique. COMPARISON:  Head CT 02/24/2023 FINDINGS: Brain: There is subarachnoid hyperdensity over the posterior right hemisphere, possibly contrast staining. Loss of gray-white differentiation in the posterior right MCA territory is unchanged. No midline shift or other mass effect. Vascular: There is now a stent in the right MCA. Skull: The visualized skull base, calvarium and extracranial soft tissues are normal. Sinuses/Orbits: Bilateral maxillary retention cysts. The orbits are normal. IMPRESSION: 1. 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 2. Hyperdensity over the posterior right hemisphere, possibly contrast staining. Attention on follow-up.  Electronically Signed   By: Deatra Robinson M.D.   On: 02/12/2023 22:11   CT ANGIO HEAD NECK W WO CM (CODE STROKE)  Result Date: 03/11/2023 CLINICAL DATA:  Neuro deficit, acute, stroke suspected EXAM: CT ANGIOGRAPHY HEAD AND NECK WITH AND WITHOUT CONTRAST TECHNIQUE: Multidetector CT imaging of the head and neck was performed using the standard protocol during bolus administration of intravenous contrast. Multiplanar CT image reconstructions and MIPs were obtained to evaluate the vascular anatomy. Carotid stenosis measurements (when applicable) are obtained utilizing NASCET criteria, using the distal internal carotid diameter as the denominator. RADIATION DOSE REDUCTION: This exam was performed according to the departmental dose-optimization program which includes automated exposure control, adjustment of the mA and/or kV according to patient size and/or use of iterative reconstruction technique. CONTRAST:  75mL OMNIPAQUE IOHEXOL 350 MG/ML SOLN COMPARISON:  Same day CT head FINDINGS: CT HEAD FINDINGS See same day CT  3.1 8.0  > 7.0       Bel elbow 8.1 - 7.1 -  Bel elbow-Wrist 26 50  > 50  Ab elbow 10.1 - 6.5 -  Ab elbow-Bel elbow 10 50 -  Sensory Sites   Neg Peak Lat Amplitude (O-P) Segment Distance Velocity Comment Site (ms) Norm (V) Norm  (cm) (ms)  Left Median Sensory Wrist-Dig II *NR  < 3.8 *NR  > 10 Wrist-Dig II 13   Left Radial Sensory Forearm-Wrist 2.2  < 2.8 *8  > 10 Forearm-Wrist 10   Left Superficial Fibular Sensory 14 cm-Ankle *NR  < 4.6 *NR  > 3 14 cm-Ankle 14   Left Sural Sensory Calf-Lat mall *NR  < 4.6 *NR  > 3 Calf-Lat mall 14   Left Ulnar Sensory Wrist-Dig V *NR  < 3.2 *NR  > 5 Wrist-Dig V 11   H-Reflex Results   M-Lat H Lat H Neg Amp H-M Lat Site (ms) (ms) Norm (mV) (ms) Left Tibial H-Reflex Pop fossa 10.0 ---  < 35.0 --- --- EMG+  Side Muscle Ins.Act Fibs Fasc Recrt Amp Dur Poly Activation Comment Left Tib ant Nml *1+ Nml *SMU *1+ *1+ *1+ Nml N/A Left Gastroc MH Nml Nml Nml Nml Nml Nml Nml Nml N/A Left FDL Nml Nml Nml *2- *1+ *1+ *1+ Nml N/A Left Fib longus Nml Nml Nml *1- *1+ *1+ *1+ Nml N/A Left Rectus fem Nml Nml Nml Nml Nml Nml Nml Nml N/A Left Biceps fem SH Nml Nml Nml Nml Nml Nml Nml Nml N/A Left Gluteus med Nml Nml Nml Nml Nml Nml Nml Nml N/A Left L5 PSP Nml Nml Nml Nml Nml Nml Nml Nml N/A Nerve Measurements  Site Area Mobility Vascularity Comment  mm Norm    Left Fibular Fib head 7.7  < 17.8     Pop fossa 9.2  < 20.9     Waveforms: Motor         Sensory         H-Reflex        Microbiology No results  found for this or any previous visit (from the past 240 hour(s)).  Lab Basic Metabolic Panel: Recent Labs  Lab 02/27/23 2335 03/01/23 0825 03/01/23 1528  0137  1654  NA 146* 143 146* 145 146*  K 3.9 3.5 4.2 4.7 5.6*  CL 112* 108  --  112*  --   CO2 24 22  --  21*  --   GLUCOSE 159* 178*  --  139*  --   BUN 90* 94*  --  97*  --   CREATININE 2.32* 2.58*  --  2.91*  --   CALCIUM 8.2* 8.2*  --  8.3*  --   MG 3.3* 3.5*  --   --   --   PHOS 4.3 4.4  --   --   --    Liver Function Tests: Recent Labs  Lab 02/27/23 2335 03/01/23 0825  ALBUMIN 2.2* 2.2*   No results for input(s): "LIPASE", "AMYLASE" in the last 168 hours. No results for input(s): "AMMONIA" in the last 168 hours. CBC: Recent Labs  Lab 02/27/23 2335 03/01/23 0825 03/01/23 1114 03/01/23 1528  0137  1354  1654  WBC 17.8* 15.5* 14.8*  --  13.7* 11.5*  --   HGB 8.2* 8.1* 8.0* 7.5* 7.3* 6.5* 6.5*  HCT 27.5* 26.8* 26.9* 22.0* 23.8* 21.6* 19.0*  MCV 86.5 85.6 88.2  --  86.2 86.4  --   PLT 322  3.1 8.0  > 7.0       Bel elbow 8.1 - 7.1 -  Bel elbow-Wrist 26 50  > 50  Ab elbow 10.1 - 6.5 -  Ab elbow-Bel elbow 10 50 -  Sensory Sites   Neg Peak Lat Amplitude (O-P) Segment Distance Velocity Comment Site (ms) Norm (V) Norm  (cm) (ms)  Left Median Sensory Wrist-Dig II *NR  < 3.8 *NR  > 10 Wrist-Dig II 13   Left Radial Sensory Forearm-Wrist 2.2  < 2.8 *8  > 10 Forearm-Wrist 10   Left Superficial Fibular Sensory 14 cm-Ankle *NR  < 4.6 *NR  > 3 14 cm-Ankle 14   Left Sural Sensory Calf-Lat mall *NR  < 4.6 *NR  > 3 Calf-Lat mall 14   Left Ulnar Sensory Wrist-Dig V *NR  < 3.2 *NR  > 5 Wrist-Dig V 11   H-Reflex Results   M-Lat H Lat H Neg Amp H-M Lat Site (ms) (ms) Norm (mV) (ms) Left Tibial H-Reflex Pop fossa 10.0 ---  < 35.0 --- --- EMG+  Side Muscle Ins.Act Fibs Fasc Recrt Amp Dur Poly Activation Comment Left Tib ant Nml *1+ Nml *SMU *1+ *1+ *1+ Nml N/A Left Gastroc MH Nml Nml Nml Nml Nml Nml Nml Nml N/A Left FDL Nml Nml Nml *2- *1+ *1+ *1+ Nml N/A Left Fib longus Nml Nml Nml *1- *1+ *1+ *1+ Nml N/A Left Rectus fem Nml Nml Nml Nml Nml Nml Nml Nml N/A Left Biceps fem SH Nml Nml Nml Nml Nml Nml Nml Nml N/A Left Gluteus med Nml Nml Nml Nml Nml Nml Nml Nml N/A Left L5 PSP Nml Nml Nml Nml Nml Nml Nml Nml N/A Nerve Measurements  Site Area Mobility Vascularity Comment  mm Norm    Left Fibular Fib head 7.7  < 17.8     Pop fossa 9.2  < 20.9     Waveforms: Motor         Sensory         H-Reflex        Microbiology No results  found for this or any previous visit (from the past 240 hour(s)).  Lab Basic Metabolic Panel: Recent Labs  Lab 02/27/23 2335 03/01/23 0825 03/01/23 1528  0137  1654  NA 146* 143 146* 145 146*  K 3.9 3.5 4.2 4.7 5.6*  CL 112* 108  --  112*  --   CO2 24 22  --  21*  --   GLUCOSE 159* 178*  --  139*  --   BUN 90* 94*  --  97*  --   CREATININE 2.32* 2.58*  --  2.91*  --   CALCIUM 8.2* 8.2*  --  8.3*  --   MG 3.3* 3.5*  --   --   --   PHOS 4.3 4.4  --   --   --    Liver Function Tests: Recent Labs  Lab 02/27/23 2335 03/01/23 0825  ALBUMIN 2.2* 2.2*   No results for input(s): "LIPASE", "AMYLASE" in the last 168 hours. No results for input(s): "AMMONIA" in the last 168 hours. CBC: Recent Labs  Lab 02/27/23 2335 03/01/23 0825 03/01/23 1114 03/01/23 1528  0137  1354  1654  WBC 17.8* 15.5* 14.8*  --  13.7* 11.5*  --   HGB 8.2* 8.1* 8.0* 7.5* 7.3* 6.5* 6.5*  HCT 27.5* 26.8* 26.9* 22.0* 23.8* 21.6* 19.0*  MCV 86.5 85.6 88.2  --  86.2 86.4  --   PLT 322  3.1 8.0  > 7.0       Bel elbow 8.1 - 7.1 -  Bel elbow-Wrist 26 50  > 50  Ab elbow 10.1 - 6.5 -  Ab elbow-Bel elbow 10 50 -  Sensory Sites   Neg Peak Lat Amplitude (O-P) Segment Distance Velocity Comment Site (ms) Norm (V) Norm  (cm) (ms)  Left Median Sensory Wrist-Dig II *NR  < 3.8 *NR  > 10 Wrist-Dig II 13   Left Radial Sensory Forearm-Wrist 2.2  < 2.8 *8  > 10 Forearm-Wrist 10   Left Superficial Fibular Sensory 14 cm-Ankle *NR  < 4.6 *NR  > 3 14 cm-Ankle 14   Left Sural Sensory Calf-Lat mall *NR  < 4.6 *NR  > 3 Calf-Lat mall 14   Left Ulnar Sensory Wrist-Dig V *NR  < 3.2 *NR  > 5 Wrist-Dig V 11   H-Reflex Results   M-Lat H Lat H Neg Amp H-M Lat Site (ms) (ms) Norm (mV) (ms) Left Tibial H-Reflex Pop fossa 10.0 ---  < 35.0 --- --- EMG+  Side Muscle Ins.Act Fibs Fasc Recrt Amp Dur Poly Activation Comment Left Tib ant Nml *1+ Nml *SMU *1+ *1+ *1+ Nml N/A Left Gastroc MH Nml Nml Nml Nml Nml Nml Nml Nml N/A Left FDL Nml Nml Nml *2- *1+ *1+ *1+ Nml N/A Left Fib longus Nml Nml Nml *1- *1+ *1+ *1+ Nml N/A Left Rectus fem Nml Nml Nml Nml Nml Nml Nml Nml N/A Left Biceps fem SH Nml Nml Nml Nml Nml Nml Nml Nml N/A Left Gluteus med Nml Nml Nml Nml Nml Nml Nml Nml N/A Left L5 PSP Nml Nml Nml Nml Nml Nml Nml Nml N/A Nerve Measurements  Site Area Mobility Vascularity Comment  mm Norm    Left Fibular Fib head 7.7  < 17.8     Pop fossa 9.2  < 20.9     Waveforms: Motor         Sensory         H-Reflex        Microbiology No results  found for this or any previous visit (from the past 240 hour(s)).  Lab Basic Metabolic Panel: Recent Labs  Lab 02/27/23 2335 03/01/23 0825 03/01/23 1528  0137  1654  NA 146* 143 146* 145 146*  K 3.9 3.5 4.2 4.7 5.6*  CL 112* 108  --  112*  --   CO2 24 22  --  21*  --   GLUCOSE 159* 178*  --  139*  --   BUN 90* 94*  --  97*  --   CREATININE 2.32* 2.58*  --  2.91*  --   CALCIUM 8.2* 8.2*  --  8.3*  --   MG 3.3* 3.5*  --   --   --   PHOS 4.3 4.4  --   --   --    Liver Function Tests: Recent Labs  Lab 02/27/23 2335 03/01/23 0825  ALBUMIN 2.2* 2.2*   No results for input(s): "LIPASE", "AMYLASE" in the last 168 hours. No results for input(s): "AMMONIA" in the last 168 hours. CBC: Recent Labs  Lab 02/27/23 2335 03/01/23 0825 03/01/23 1114 03/01/23 1528  0137  1354  1654  WBC 17.8* 15.5* 14.8*  --  13.7* 11.5*  --   HGB 8.2* 8.1* 8.0* 7.5* 7.3* 6.5* 6.5*  HCT 27.5* 26.8* 26.9* 22.0* 23.8* 21.6* 19.0*  MCV 86.5 85.6 88.2  --  86.2 86.4  --   PLT 322  3.1 8.0  > 7.0       Bel elbow 8.1 - 7.1 -  Bel elbow-Wrist 26 50  > 50  Ab elbow 10.1 - 6.5 -  Ab elbow-Bel elbow 10 50 -  Sensory Sites   Neg Peak Lat Amplitude (O-P) Segment Distance Velocity Comment Site (ms) Norm (V) Norm  (cm) (ms)  Left Median Sensory Wrist-Dig II *NR  < 3.8 *NR  > 10 Wrist-Dig II 13   Left Radial Sensory Forearm-Wrist 2.2  < 2.8 *8  > 10 Forearm-Wrist 10   Left Superficial Fibular Sensory 14 cm-Ankle *NR  < 4.6 *NR  > 3 14 cm-Ankle 14   Left Sural Sensory Calf-Lat mall *NR  < 4.6 *NR  > 3 Calf-Lat mall 14   Left Ulnar Sensory Wrist-Dig V *NR  < 3.2 *NR  > 5 Wrist-Dig V 11   H-Reflex Results   M-Lat H Lat H Neg Amp H-M Lat Site (ms) (ms) Norm (mV) (ms) Left Tibial H-Reflex Pop fossa 10.0 ---  < 35.0 --- --- EMG+  Side Muscle Ins.Act Fibs Fasc Recrt Amp Dur Poly Activation Comment Left Tib ant Nml *1+ Nml *SMU *1+ *1+ *1+ Nml N/A Left Gastroc MH Nml Nml Nml Nml Nml Nml Nml Nml N/A Left FDL Nml Nml Nml *2- *1+ *1+ *1+ Nml N/A Left Fib longus Nml Nml Nml *1- *1+ *1+ *1+ Nml N/A Left Rectus fem Nml Nml Nml Nml Nml Nml Nml Nml N/A Left Biceps fem SH Nml Nml Nml Nml Nml Nml Nml Nml N/A Left Gluteus med Nml Nml Nml Nml Nml Nml Nml Nml N/A Left L5 PSP Nml Nml Nml Nml Nml Nml Nml Nml N/A Nerve Measurements  Site Area Mobility Vascularity Comment  mm Norm    Left Fibular Fib head 7.7  < 17.8     Pop fossa 9.2  < 20.9     Waveforms: Motor         Sensory         H-Reflex        Microbiology No results  found for this or any previous visit (from the past 240 hour(s)).  Lab Basic Metabolic Panel: Recent Labs  Lab 02/27/23 2335 03/01/23 0825 03/01/23 1528  0137  1654  NA 146* 143 146* 145 146*  K 3.9 3.5 4.2 4.7 5.6*  CL 112* 108  --  112*  --   CO2 24 22  --  21*  --   GLUCOSE 159* 178*  --  139*  --   BUN 90* 94*  --  97*  --   CREATININE 2.32* 2.58*  --  2.91*  --   CALCIUM 8.2* 8.2*  --  8.3*  --   MG 3.3* 3.5*  --   --   --   PHOS 4.3 4.4  --   --   --    Liver Function Tests: Recent Labs  Lab 02/27/23 2335 03/01/23 0825  ALBUMIN 2.2* 2.2*   No results for input(s): "LIPASE", "AMYLASE" in the last 168 hours. No results for input(s): "AMMONIA" in the last 168 hours. CBC: Recent Labs  Lab 02/27/23 2335 03/01/23 0825 03/01/23 1114 03/01/23 1528  0137  1354  1654  WBC 17.8* 15.5* 14.8*  --  13.7* 11.5*  --   HGB 8.2* 8.1* 8.0* 7.5* 7.3* 6.5* 6.5*  HCT 27.5* 26.8* 26.9* 22.0* 23.8* 21.6* 19.0*  MCV 86.5 85.6 88.2  --  86.2 86.4  --   PLT 322

## 2023-03-12 NOTE — Progress Notes (Signed)
Occupational Therapy Treatment Patient Details Name: Xavier [name] MRN: [id number] DOB: 1940-08-19 Today's Date:    History of present illness Patient is 82 y.o. male who presented 8/9 after acute onset left-sided weakness, facial droop, and aphasia. 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 8/9 pt s/p TNK and thrombectomy and revascularization with stent in MCA M1 segment. Pt intubated for airway protection, antibiotics started for aspiration, antivirals started for COVID and flu on 8/9. Pt failed extubation on 8/11 and re-intubated. Successful extubation to [facility] on 8/13. PMH significant for DM, HTN, prostate cancer.   OT comments  Pt progressing slowly toward established OT goals. Focus session on therapeutic exercise to facilitate motor control, coordination, following commands/cognition, and strength. Pt RUE noted with increased edema and RN aware; also with questionable development of R shoulder sublux. Pt sitting EOB this session with min guard-max A. Min guard only after standing with Min A +2 for initial rise then max A for achievement of fully upright position. Facilitating normalized patterns with BUE weightbearing and AAROM with OT facilitation of proper body mechanics of BUE. Will continue to follow. Patient will benefit from continued inpatient follow up therapy, <3 hours/day       If plan is discharge home, recommend the following:  Two people to help with walking and/or transfers;Two people to help with bathing/dressing/bathroom;Assistance with cooking/housework;Direct supervision/assist for medications management;Direct supervision/assist for financial management;Assist for transportation;Help with stairs or ramp for entrance   Equipment Recommendations  Other (comment) (defer)    Recommendations for Other Services      Precautions / Restrictions Precautions Precautions: Fall Precaution Comments: HFNC;  coretrack; flexiseal as of 8/16 Restrictions Weight Bearing Restrictions: No       Mobility Bed Mobility Overal bed mobility: Needs Assistance Bed Mobility: Rolling, Sit to Supine, Supine to Sit Rolling: Min assist   Supine to sit: Mod assist Sit to supine: Min assist   General bed mobility comments: Mod A to come to EOB with max cues for sequencing and movement of LE. Min A  at BLE  to return to supine after pt fatigued but with good initiation    Transfers Overall transfer level: Needs assistance Equipment used: 2 person hand held assist Transfers: Sit to/from Stand Sit to Stand: Min assist, Max assist, +2 physical assistance, +2 safety/equipment           General transfer comment: Min A for initial power up, but signifiant hip hinge in standing with trunk facing floor. Max A of 2 to facilitate tucking bottom with facilitation at pelvis with 2 sets of skilled hands as well as BLE blocking for safety but no significant buncling noted.     Balance Overall balance assessment: Needs assistance Sitting-balance support: Bilateral upper extremity supported, Feet supported Sitting balance-Leahy Scale: Poor Sitting balance - Comments: Min A for static sitting EOB during ther-ex. Min guard after standing attempts with +2 A   Standing balance support: Bilateral upper extremity supported Standing balance-Leahy Scale: Zero                             ADL either performed or assessed with clinical judgement   ADL Overall ADL's : Needs assistance/impaired                         Toilet Transfer: Minimal assistance;Moderate assistance;Maximal assistance Statistician Details (  indicate cue type and reason): STS see transfer comments           General ADL Comments: Focus session on ther-ex/ther-act. See exercises below.    Extremity/Trunk Assessment Upper Extremity Assessment Upper Extremity Assessment: RUE deficits/detail;LUE deficits/detail RUE  Deficits / Details: following commands with multimodal cues. Observeing LUE edema in forearm and near level of elbow; RN reporting IV infiltrate. noted R sublux LUE Deficits / Details: Follows commands with LUE >LLE. Poor control of LUE with involuntary movement in addition to poorly coordinated voluntary movement. Greater coordination and control after OT facilitation of weightbearing through LUE   Lower Extremity Assessment Lower Extremity Assessment: Defer to PT evaluation        Vision   Additional Comments: intermittently visually locating therapist, but unable to keep eyes open for tracking or further visual assessment   Perception     Praxis      Cognition Arousal: Lethargic Behavior During Therapy: Flat affect Overall Cognitive Status: Impaired/Different from baseline Area of Impairment: Memory, Following commands, Safety/judgement, Awareness, Problem solving                     Memory: Decreased short-term memory Following Commands: Follows one step commands inconsistently Safety/Judgement: Decreased awareness of safety Awareness: Intellectual Problem Solving: Slow processing, Decreased initiation, Difficulty sequencing, Requires verbal cues General Comments: Follows basic one step commands with incresed time inconsistently based on arousal. Often needing cued to reach a more alert state and then greater skill to follow command. Also following more commands at EOB than bed level. Able to consistently nod head yes/no with time and when alert. Max difficulty with verbal communication and able to state 2 words clearly this session, otherwise garbled speech        Exercises Exercises: Other exercises Other Exercises Other Exercises: R and L lateral lean onto bent extermity to with weightbearing onto R and L arms with ~30 second hold to L and R. Pt able to return to midline from L lateral lean but min A to return to midline from R Other Exercises: RUE AAROM 5x shoulder  flexion with OT protection of shoulder integrity and only to ~90 degrees due to potiential sublus. elbow flexion 10x, ditit composite grasp 10x with max multimodal cues to sustain task and fully open/close digits. Facilitating weightbearing with BUE throughout RUE ther ex and pt needing up to max A for sititng balance during task Other Exercises: LUE AAROM with OT guiding normalized movement patterns. Pt with decresed control of LUE and then involuntary movement; return to lateral lean onto elbow and then less involuntary movement after    Shoulder Instructions       General Comments HHFNC at 45L/min 53% FiO2    Pertinent Vitals/ Pain       Pain Assessment Pain Assessment: Faces Faces Pain Scale: Hurts a little bit Pain Location: bottom Pain Descriptors / Indicators: Grimacing, Sore Pain Intervention(s): Limited activity within patient's tolerance, Monitored during session  Home Living                                          Prior Functioning/Environment              Frequency  Min 2X/week        Progress Toward Goals  OT Goals(current goals can now be found in the care plan section)  Progress towards OT goals:  Progressing toward goals  Acute Rehab OT Goals Patient Stated Goal: none stated OT Goal Formulation: With patient Time For Goal Achievement: 03/08/23 Potential to Achieve Goals: Good ADL Goals Pt Will Perform Grooming: with set-up;sitting Pt Will Transfer to Toilet: with mod assist;stand pivot transfer Additional ADL Goal #1: Pt will perform bed mobility with min A as a precursor for ADL Additional ADL Goal #2: Pt will follow all one step commands during ADL with increased time and no more than min cues.  Plan      Co-evaluation                 AM-PAC OT "6 Clicks" Daily Activity     Outcome Measure   Help from another person eating meals?: Total Help from another person taking care of personal grooming?: A Lot Help from  another person toileting, which includes using toliet, bedpan, or urinal?: Total Help from another person bathing (including washing, rinsing, drying)?: Total Help from another person to put on and taking off regular upper body clothing?: Total Help from another person to put on and taking off regular lower body clothing?: Total 6 Click Score: 7    End of Session Equipment Utilized During Treatment: Oxygen  OT Visit Diagnosis: Unsteadiness on feet (R26.81);Muscle weakness (generalized) (M62.81);Other symptoms and signs involving cognitive function   Activity Tolerance Patient limited by lethargy   Patient Left in bed;with call bell/phone within reach;with bed alarm set   Nurse Communication Mobility status        Time: 1610-9604 OT Time Calculation (min): 20 min  Charges: OT General Charges $OT Visit: 1 Visit OT Treatments $Therapeutic Exercise: 8-22 mins  Tyler Deis, OTR/L [facility] Acute Rehabilitation Office: [phone]   Myrla Halsted , 1:07 PM

## 2023-03-12 NOTE — Progress Notes (Signed)
Called to bedside by RN to [facility] pt. Upon arrival pt was audible gurgling from oropharynx. Elicited a cough with oral yankauer for small-mod thick off white secretions. Pt did not have spontaneous cough or swallow. Advanced catheter orally again but produced a cough and gag and pt vomited copious amount of thin gastric fluid. Pt O2 sats remained low and his work of breathing increased. Increased flow to HHFNC to 60LPM w/minimal increase noted. HR in low 40s. Pt mouth breathing so placed NRBM for again minimal increase in SATS low to mid 80s. Respiratory effort did improve. BS noted to remain clear and diminished in both bases R>L but good bilat aeration audible. Per MD, nasal trumpet placed in L nare and Duoneb aerosol Tx given. No change in vitals noted.

## 2023-03-12 NOTE — Progress Notes (Signed)
Physical Therapy Treatment Patient Details Name: Xavier [name] MRN: [id number] DOB: 1940/12/18 Today's Date: 03-18-23   History of Present Illness Patient is 82 y.o. male who presented 8/9 after acute onset left-sided weakness, facial droop, and aphasia. 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 8/9 pt s/p TNK and thrombectomy and revascularization with stent in MCA M1 segment. Pt intubated for airway protection, antibiotics started for aspiration, antivirals started for COVID and flu on 8/9. Pt failed extubation on 8/11 and re-intubated. Successful extubation to [facility] on 8/13. PMH significant for DM, HTN, prostate cancer.    PT Comments  Pt with continued progress towards acute goals this session, however limited by restlessness and decreased alertness and attention. Pt able to progress standing tolerance coming to stand x3 throughout session with min-max A, with increasing assist needed as pt fatigue increasing. Pt needing grossly min-mod A for bed mobility and constant hands on assist to maintain balance seated up EOB. Current plan remains appropriate to address deficits and maximize functional independence and decrease caregiver burden. Pt continues to benefit from skilled PT services to progress toward functional mobility goals.      If plan is discharge home, recommend the following: Two people to help with walking and/or transfers;Two people to help with bathing/dressing/bathroom;Assistance with cooking/housework;Assistance with feeding;Direct supervision/assist for medications management;Assist for transportation;Help with stairs or ramp for entrance;Supervision due to cognitive status   Can travel by private vehicle     No  Equipment Recommendations       Recommendations for Other Services       Precautions / Restrictions Precautions Precautions: Fall Precaution Comments: HFNC; coretrack; flexiseal as of  8/16 Restrictions Weight Bearing Restrictions: No     Mobility  Bed Mobility Overal bed mobility: Needs Assistance Bed Mobility: Rolling, Sit to Supine, Supine to Sit Rolling: Min assist   Supine to sit: Mod assist Sit to supine: Min assist   General bed mobility comments: Mod A to come to EOB with max cues for sequencing and movement of LE. Min A  at BLE  to return to supine after pt fatigued but with good initiation    Transfers Overall transfer level: Needs assistance Equipment used: 2 person hand held assist Transfers: Sit to/from Stand Sit to Stand: Min assist, Max assist, +2 physical assistance, +2 safety/equipment           General transfer comment: Min A for initial power up, but signifiant hip hinge in standing with trunk facing floor. Max A of 2 to facilitate tucking bottom with facilitation at pelvis with 2 sets of skilled hands as well as BLE blocking for safety but no significant buncling noted.    Ambulation/Gait                   Stairs             Wheelchair Mobility     Tilt Bed    Modified Rankin (Stroke Patients Only)       Balance Overall balance assessment: Needs assistance Sitting-balance support: Bilateral upper extremity supported, Feet supported Sitting balance-Leahy Scale: Poor Sitting balance - Comments: Min A for static sitting EOB during ther-ex. Min guard after standing attempts with +2 A   Standing balance support: Bilateral upper extremity supported Standing balance-Leahy Scale: Zero  Cognition Arousal: Lethargic Behavior During Therapy: Flat affect Overall Cognitive Status: Impaired/Different from baseline Area of Impairment: Memory, Following commands, Safety/judgement, Awareness, Problem solving                     Memory: Decreased short-term memory Following Commands: Follows one step commands inconsistently Safety/Judgement: Decreased awareness of  safety Awareness: Intellectual Problem Solving: Slow processing, Decreased initiation, Difficulty sequencing, Requires verbal cues General Comments: Follows basic one step commands with incresed time inconsistently based on arousal. Often needing cued to reach a more alert state and then greater skill to follow command. Also following more commands at EOB than bed level. Able to consistently nod head yes/no with time and when alert. Max difficulty with verbal communication and able to state 2 words clearly this session, otherwise garbled speech        Exercises      General Comments General comments (skin integrity, edema, etc.): HHFNC at 45L/min 53% FiO2      Pertinent Vitals/Pain Pain Assessment Pain Assessment: Faces Faces Pain Scale: Hurts a little bit Pain Location: bottom Pain Descriptors / Indicators: Grimacing, Sore Pain Intervention(s): Monitored during session, Limited activity within patient's tolerance    Home Living                          Prior Function            PT Goals (current goals can now be found in the care plan section) Acute Rehab PT Goals PT Goal Formulation: With family Time For Goal Achievement: 03/08/23 Progress towards PT goals: Progressing toward goals    Frequency    Min 1X/week      PT Plan      Co-evaluation              AM-PAC PT "6 Clicks" Mobility   Outcome Measure  Help needed turning from your back to your side while in a flat bed without using bedrails?: Total Help needed moving from lying on your back to sitting on the side of a flat bed without using bedrails?: Total Help needed moving to and from a bed to a chair (including a wheelchair)?: Total Help needed standing up from a chair using your arms (e.g., wheelchair or bedside chair)?: Total Help needed to walk in hospital room?: Total Help needed climbing 3-5 steps with a railing? : Total 6 Click Score: 6    End of Session Equipment Utilized During  Treatment: Oxygen Activity Tolerance: Patient tolerated treatment well;Patient limited by fatigue Patient left: in bed;with call bell/phone within reach;with nursing/sitter in room;with family/visitor present Nurse Communication: Mobility status PT Visit Diagnosis: Other abnormalities of gait and mobility (R26.89);Muscle weakness (generalized) (M62.81);Difficulty in walking, not elsewhere classified (R26.2);Other symptoms and signs involving the nervous system (R29.898)     Time: 4098-1191 PT Time Calculation (min) (ACUTE ONLY): 16 min  Charges:    $Therapeutic Activity: 8-22 mins PT General Charges $$ ACUTE PT VISIT: 1 Visit                     Gabrial Domine R. PTA Acute Rehabilitation Services Office: [phone]   Catalina Antigua 03-29-23, 4:44 PM

## 2023-03-12 NOTE — IPAL (Signed)
  Interdisciplinary Goals of Care Family Meeting   Date carried out:   Location of the meeting: Unit  Member's involved: Physician, Bedside Registered Nurse, and Family Member or next of kin  Durable Power of Attorney or acting medical decision maker: wife    Discussion: We discussed goals of care for Xavier [name] .  His wife understands this significant setback in his care. We discussed the case with his daughter over the phone. They determined he would not want to be back on the ventilator. We discussed the recommendation for changing code status to DNR if he would not want MV; at this point he would likely have a very signficant neurologic injury if he had a cardiac arrest. His wife agrees with DNR & DNI. Liberalized visitation.  His daughter lives in [place] and one brother lives in [place], but he has 2 siblings in [place] that are being called.   Code status:   Code Status: DNR   Disposition: Continue current acute care  Time spent for the meeting: 10 min.    Steffanie Dunn, DO  , 5:56 PM

## 2023-03-12 NOTE — Progress Notes (Signed)
Notified that he is less responsive-- only responding to painful stimulation- twisting nipples, not sternal rub or NTS.   BP (!) 155/44   Pulse (!) 57   Temp 98.3 F (36.8 C) (Oral)   Resp (!) 30   Ht 6\' 2"  (1.88 m)   Wt 83.2 kg   SpO2 95%   BMI 23.55 kg/m  Sleeping, irregular L pupil, pinpoint R pupil tachypneic, still having rhonchi NTS with delayed cough, some thick white secretions Tearing up, moving his left hand during NTS. Not moving R side. Some R sided facial droop. Weak gag reflex.  Irreg heart rhythm, ranging from 40-60s rate   BG 190 STAT ABG ordered SBP 120s- not hypotensive despite varying heart rate  Stroke code called, not TPA candidate due to eliquis and recent CVA STAT head CT Atropine given for bradycardic episodes. In CT HR remains in 70s and BP remained stable.   No hypercapnia on ABG. PaO2 in low 50s-- neither explains mental status.  Head CT w/o bleeding. Neurology present during CT and did exam- not moving R side, moving left side more-- upper and lower extremity. Not opening eyes, but moving his head back and forth frequently. Not attempting to talk. Still has weak cough.  Concern for new L MCA CVA. D/w neuro IR & Neurology-- not a candidate for thrombectomy and contrasted CT is high risk with his renal dysfunction at present. If he were to undergo an MRI he would require intubation for the prolonged time with less monitoring, and he currently will not lay still enough to get the necessary imaging.   Attempted to call wife [name]-- no answer, L/M Called daughter Marcelino Duster-- discussed that he very likely had another large stroke and we do not have the same treatment options for this stroke given his recent contralateral stroke. She understands that we are likely to have permanent R-sided weakness and aphasia. She also understands we need to make a decision regarding putting him back on the vent soon, and I am not optimistic that we could wean him off with his  current poor secretion management/ weak cough. She will call her mother.   Additional cc time: 40 min.  Steffanie Dunn, DO  4:53 PM [facility]  For contact information, see Amion. If no response to pager, please call PCCM consult pager. After hours, 7PM- 7AM, please call Elink.

## 2023-03-12 NOTE — Progress Notes (Signed)
eLink Physician-Brief Progress Note Patient Name: Xavier [name] DOB: 1941-06-08 MRN: [id number]   Date of Service    HPI/Events of Note  Patient with decline in mental status and bradycardia, likely reflecting evolving post-CVA neurological decline, saturation dropped as well.  eICU Interventions  Atropine 0.5 mg iv x 1, nasal trumpet, stat portable CXR to evaluate for progressive aspiration, I updated daughter and discussed possibility of transition to comfort measures but she seems to not quite be there yet, have asked the ground crew to stop by and add to the conversation.        Migdalia Dk , 11:42 PM

## 2023-03-12 DEATH — deceased

## 2023-05-05 ENCOUNTER — Ambulatory Visit: Payer: PPO | Admitting: Neurology

## 2023-12-04 IMAGING — MR MR LUMBAR SPINE W/O CM
4 of 5 series · 26 of 48 positions shown · non-contrast
Comparison: None available

CLINICAL DATA: Low back pain with numbness in the bilateral feet

EXAM:
MRI LUMBAR SPINE WITHOUT CONTRAST
TECHNIQUE: Multiplanar, multisequence MR imaging of the lumbar spine was
performed. No intravenous contrast was administered.

[Series 2: T2 · sagittal · 4.0mm · 0.57mm/px · 6 of 15 slices shown (1 of 2)]
[im 1/15]
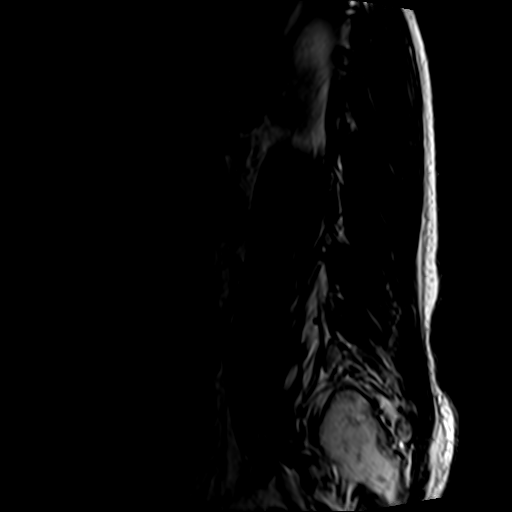
[im 3/15]
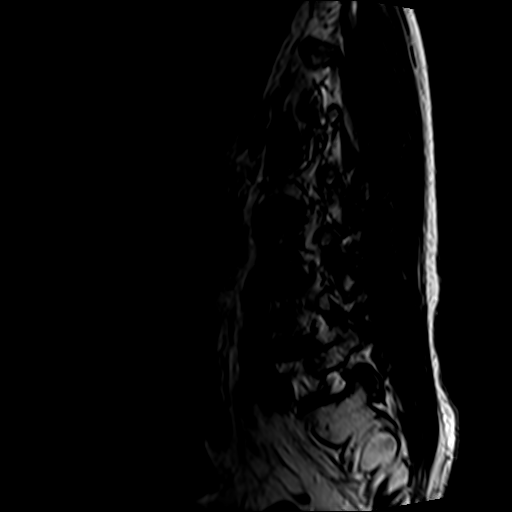
[im 6/15]
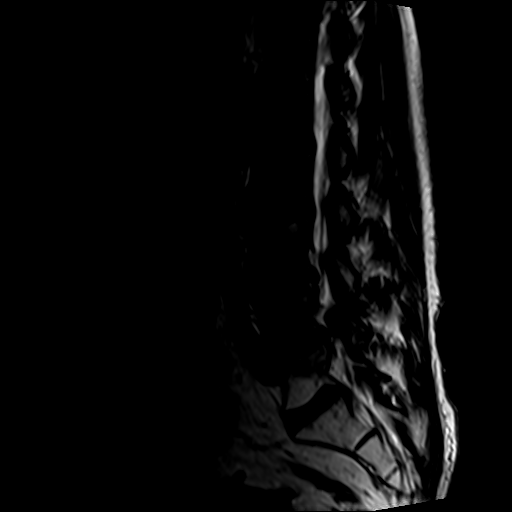
[im 9/15]
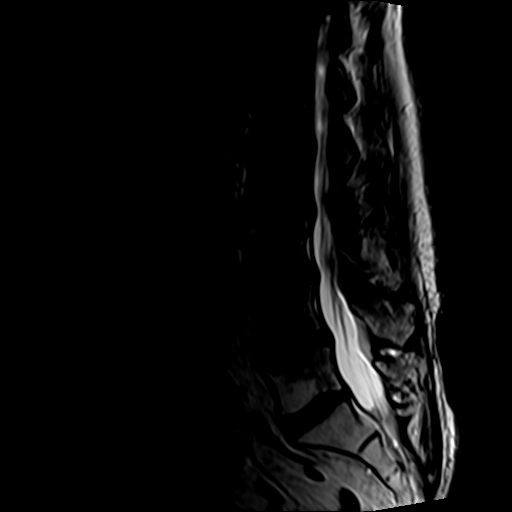
[im 12/15]
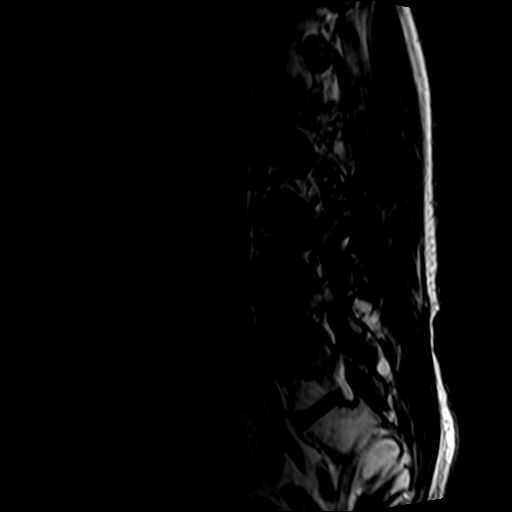
[im 15/15]
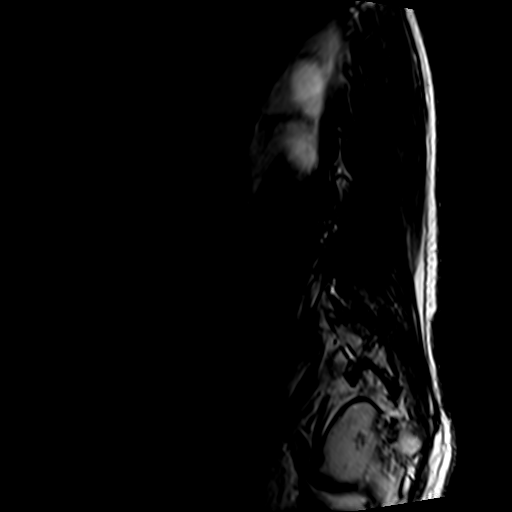

[Series 4: T1 · sagittal · 4.0mm · 0.57mm/px · 6 of 15 slices shown (1 of 2)]
[im 1/15]
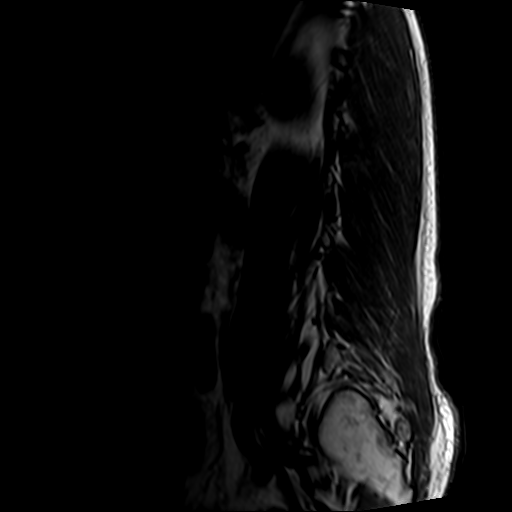
[im 3/15]
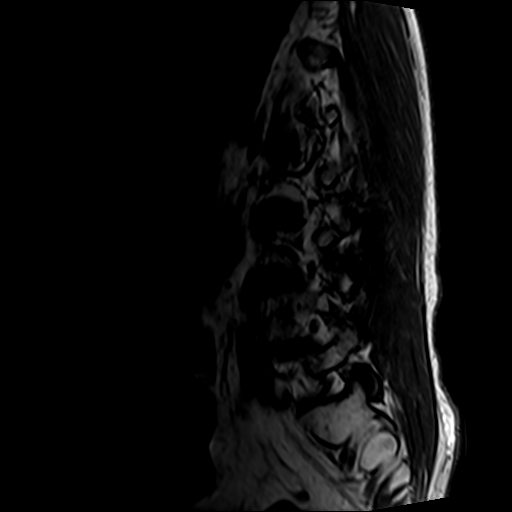
[im 6/15]
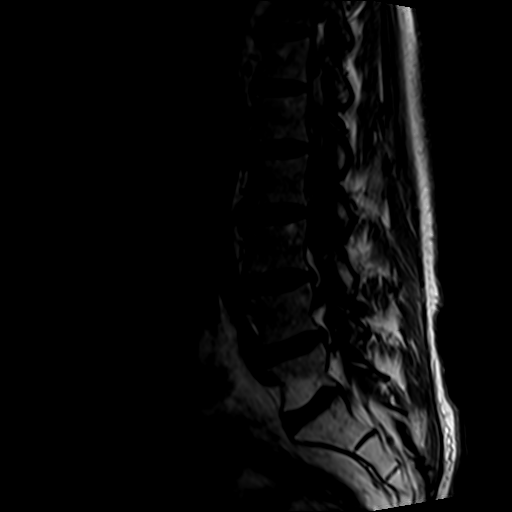
[im 9/15]
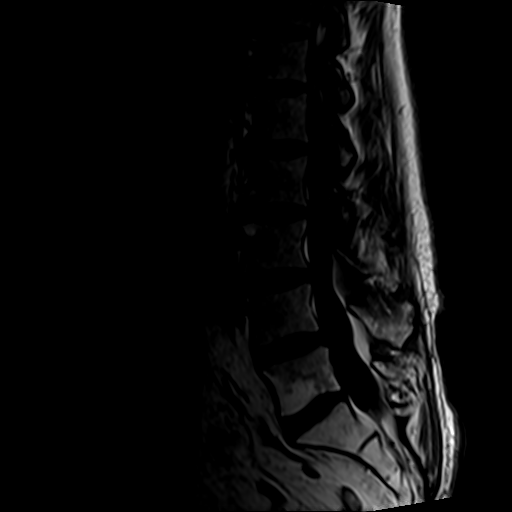
[im 12/15]
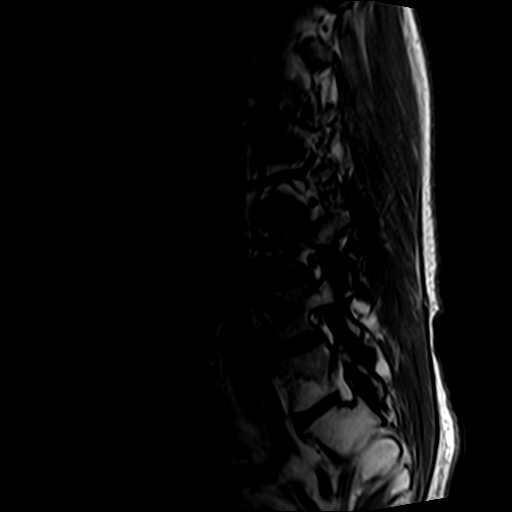
[im 15/15]
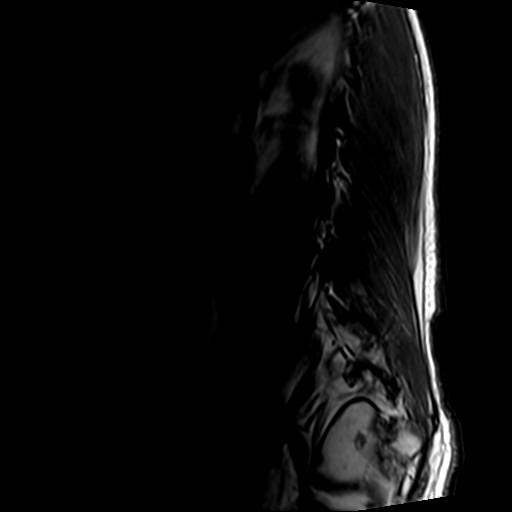

[Series 5: T2 · axial · 4.0mm · 0.70mm/px · z∈[-69,+144]mm · 9 of 39 slices shown (2 of 2)]
[im 1/39]
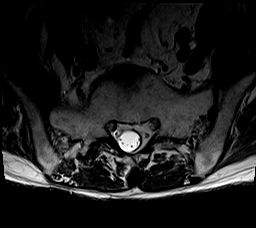
[im 6/39]
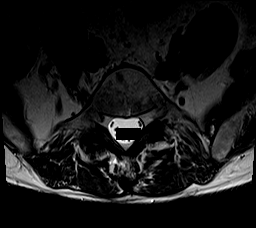
[im 11/39]
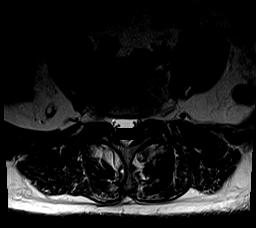
[im 17/39]
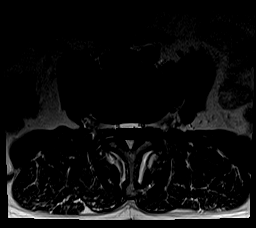
[im 20/39]
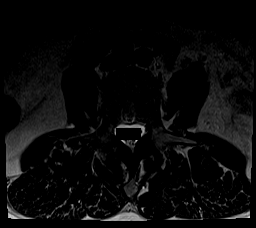
[im 22/39]
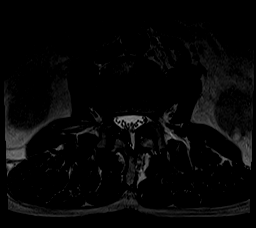
[im 28/39]
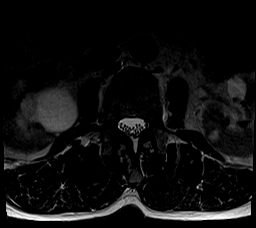
[im 33/39]
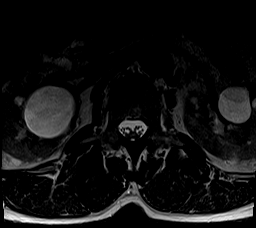
[im 39/39]
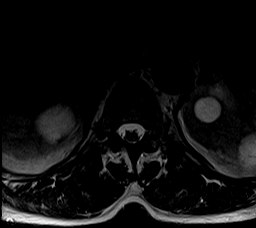

[Series 6: T1 · axial · 4.0mm · 0.35mm/px · z∈[-69,+113]mm · 5 of 39 slices shown (2 of 2)]
[im 1/39]
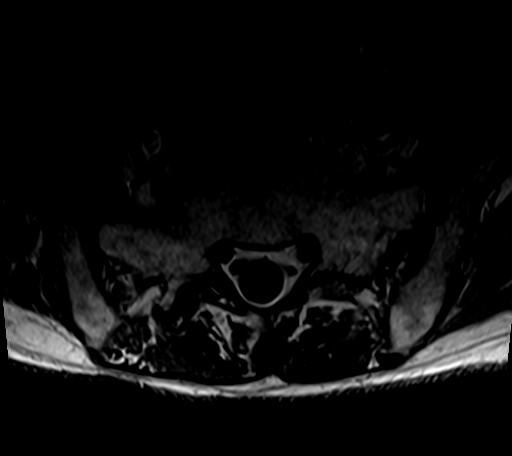
[im 6/39]
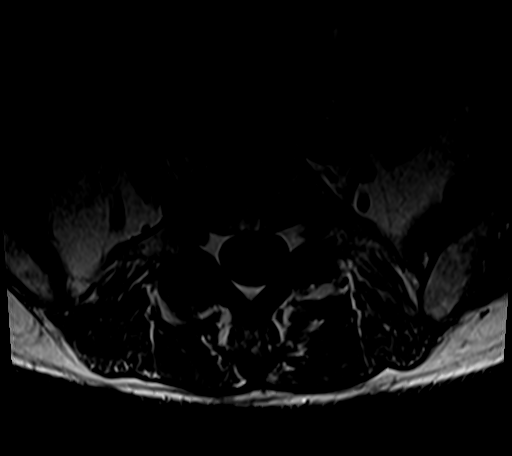
[im 11/39]
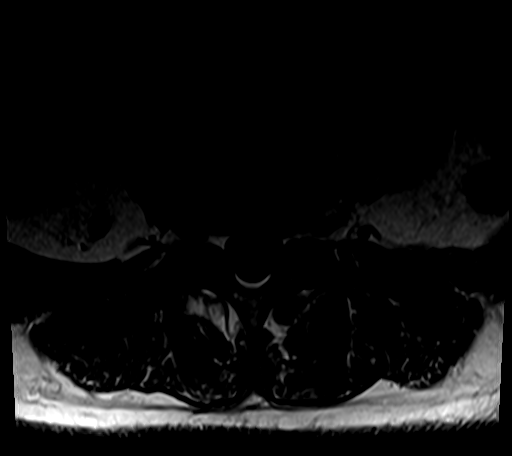
[im 20/39]
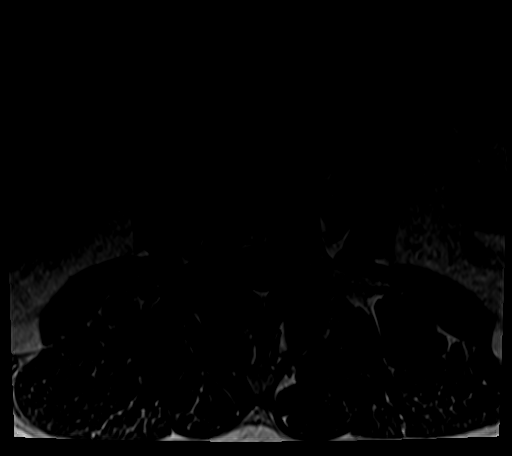
[im 33/39]
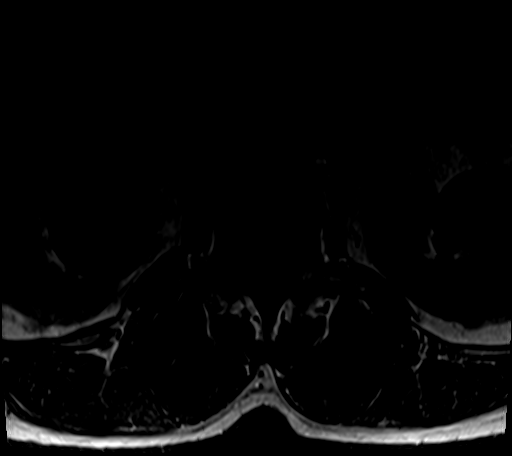

[26 of 48 positions shown; findings below may reference images not displayed]

FINDINGS: Segmentation:  Standard.

Alignment:  Physiologic.

Vertebrae: No fracture, evidence of discitis, or bone lesion. Fatty
marrow accentuation at L5 and S1. History of prostate cancer with
presumed radiotherapy.

Conus medullaris and cauda equina: Conus extends to the L1-2 level.
Conus and cauda equina appear normal.

Paraspinal and other soft tissues: Bilateral renal cysts. Descending
colonic diverticulosis.

Disc levels:

T12- L1: Unremarkable.

L1-L2: Unremarkable.

L2-L3: Mild facet spurring and disc space narrowing

L3-L4: Moderate degenerative facet spurring. Mild disc space
narrowing and bulging

L4-L5: Moderate degenerative facet spurring. Mild disc narrowing and
bulging. Small right foraminal annular fissure.

L5-S1:Moderate facet spurring asymmetric to the right. No herniation
or impingement.
IMPRESSION: Lumbar spine degeneration most notably affecting the facets. No
neural impingement or visible inflammation.
